# Patient Record
Sex: Male | Born: 1965 | Race: Black or African American | Hispanic: No | Marital: Single | Smoking: Current every day smoker
Health system: Southern US, Community
[De-identification: ages and names within clinical notes are randomized; demographics above are authoritative.]

## PROBLEM LIST (undated history)

## (undated) DIAGNOSIS — R45851 Suicidal ideations: Secondary | ICD-10-CM

## (undated) DIAGNOSIS — E119 Type 2 diabetes mellitus without complications: Secondary | ICD-10-CM

## (undated) DIAGNOSIS — I214 Non-ST elevation (NSTEMI) myocardial infarction: Secondary | ICD-10-CM

## (undated) DIAGNOSIS — F329 Major depressive disorder, single episode, unspecified: Secondary | ICD-10-CM

## (undated) DIAGNOSIS — I639 Cerebral infarction, unspecified: Secondary | ICD-10-CM

## (undated) DIAGNOSIS — I10 Essential (primary) hypertension: Secondary | ICD-10-CM

## (undated) DIAGNOSIS — F32A Depression, unspecified: Secondary | ICD-10-CM

---

## 2005-01-27 DIAGNOSIS — I214 Non-ST elevation (NSTEMI) myocardial infarction: Secondary | ICD-10-CM

## 2005-01-27 HISTORY — DX: Non-ST elevation (NSTEMI) myocardial infarction: I21.4

## 2015-01-28 DIAGNOSIS — I639 Cerebral infarction, unspecified: Secondary | ICD-10-CM

## 2015-01-28 HISTORY — DX: Cerebral infarction, unspecified: I63.9

## 2017-06-17 ENCOUNTER — Emergency Department (HOSPITAL_COMMUNITY): Payer: Self-pay

## 2017-06-17 ENCOUNTER — Inpatient Hospital Stay (HOSPITAL_COMMUNITY)
Admission: EM | Admit: 2017-06-17 | Discharge: 2017-07-03 | DRG: 234 | Disposition: A | Discharge status: Skilled Nursing Facility | Payer: Non-veteran care | Attending: Cardiothoracic Surgery | Admitting: Cardiothoracic Surgery

## 2017-06-17 ENCOUNTER — Encounter (HOSPITAL_COMMUNITY): Payer: Self-pay | Admitting: Emergency Medicine

## 2017-06-17 DIAGNOSIS — I200 Unstable angina: Secondary | ICD-10-CM | POA: Diagnosis not present

## 2017-06-17 DIAGNOSIS — I252 Old myocardial infarction: Secondary | ICD-10-CM

## 2017-06-17 DIAGNOSIS — F17210 Nicotine dependence, cigarettes, uncomplicated: Secondary | ICD-10-CM | POA: Diagnosis present

## 2017-06-17 DIAGNOSIS — I10 Essential (primary) hypertension: Secondary | ICD-10-CM | POA: Diagnosis not present

## 2017-06-17 DIAGNOSIS — Z951 Presence of aortocoronary bypass graft: Secondary | ICD-10-CM

## 2017-06-17 DIAGNOSIS — Z794 Long term (current) use of insulin: Secondary | ICD-10-CM

## 2017-06-17 DIAGNOSIS — E7800 Pure hypercholesterolemia, unspecified: Secondary | ICD-10-CM

## 2017-06-17 DIAGNOSIS — I2583 Coronary atherosclerosis due to lipid rich plaque: Secondary | ICD-10-CM | POA: Diagnosis not present

## 2017-06-17 DIAGNOSIS — Z8249 Family history of ischemic heart disease and other diseases of the circulatory system: Secondary | ICD-10-CM

## 2017-06-17 DIAGNOSIS — I25110 Atherosclerotic heart disease of native coronary artery with unstable angina pectoris: Principal | ICD-10-CM | POA: Diagnosis present

## 2017-06-17 DIAGNOSIS — R791 Abnormal coagulation profile: Secondary | ICD-10-CM | POA: Diagnosis not present

## 2017-06-17 DIAGNOSIS — Z79899 Other long term (current) drug therapy: Secondary | ICD-10-CM

## 2017-06-17 DIAGNOSIS — I493 Ventricular premature depolarization: Secondary | ICD-10-CM | POA: Diagnosis not present

## 2017-06-17 DIAGNOSIS — I2089 Other forms of angina pectoris: Secondary | ICD-10-CM | POA: Diagnosis present

## 2017-06-17 DIAGNOSIS — T82855A Stenosis of coronary artery stent, initial encounter: Secondary | ICD-10-CM | POA: Diagnosis present

## 2017-06-17 DIAGNOSIS — Z8673 Personal history of transient ischemic attack (TIA), and cerebral infarction without residual deficits: Secondary | ICD-10-CM

## 2017-06-17 DIAGNOSIS — R079 Chest pain, unspecified: Secondary | ICD-10-CM

## 2017-06-17 DIAGNOSIS — D696 Thrombocytopenia, unspecified: Secondary | ICD-10-CM | POA: Diagnosis not present

## 2017-06-17 DIAGNOSIS — E785 Hyperlipidemia, unspecified: Secondary | ICD-10-CM | POA: Diagnosis not present

## 2017-06-17 DIAGNOSIS — I2510 Atherosclerotic heart disease of native coronary artery without angina pectoris: Secondary | ICD-10-CM | POA: Diagnosis not present

## 2017-06-17 DIAGNOSIS — D62 Acute posthemorrhagic anemia: Secondary | ICD-10-CM | POA: Diagnosis not present

## 2017-06-17 DIAGNOSIS — I208 Other forms of angina pectoris: Secondary | ICD-10-CM | POA: Diagnosis present

## 2017-06-17 DIAGNOSIS — Z955 Presence of coronary angioplasty implant and graft: Secondary | ICD-10-CM

## 2017-06-17 DIAGNOSIS — E119 Type 2 diabetes mellitus without complications: Secondary | ICD-10-CM | POA: Diagnosis present

## 2017-06-17 DIAGNOSIS — Z7902 Long term (current) use of antithrombotics/antiplatelets: Secondary | ICD-10-CM

## 2017-06-17 DIAGNOSIS — E8770 Fluid overload, unspecified: Secondary | ICD-10-CM | POA: Diagnosis present

## 2017-06-17 DIAGNOSIS — Y831 Surgical operation with implant of artificial internal device as the cause of abnormal reaction of the patient, or of later complication, without mention of misadventure at the time of the procedure: Secondary | ICD-10-CM | POA: Diagnosis present

## 2017-06-17 DIAGNOSIS — Z833 Family history of diabetes mellitus: Secondary | ICD-10-CM

## 2017-06-17 DIAGNOSIS — I120 Hypertensive chronic kidney disease with stage 5 chronic kidney disease or end stage renal disease: Secondary | ICD-10-CM | POA: Diagnosis present

## 2017-06-17 HISTORY — DX: Type 2 diabetes mellitus without complications: E11.9

## 2017-06-17 HISTORY — DX: Essential (primary) hypertension: I10

## 2017-06-17 HISTORY — DX: Non-ST elevation (NSTEMI) myocardial infarction: I21.4

## 2017-06-17 HISTORY — DX: Cerebral infarction, unspecified: I63.9

## 2017-06-17 LAB — CBC
HCT: 44.1 % (ref 39.0–52.0)
HEMATOCRIT: 40.9 % (ref 39.0–52.0)
Hemoglobin: 15 g/dL (ref 13.0–17.0)
Hemoglobin: 13.9 g/dL (ref 13.0–17.0)
MCH: 30.2 pg (ref 26.0–34.0)
MCH: 30.1 pg (ref 26.0–34.0)
MCHC: 34 g/dL (ref 30.0–36.0)
MCHC: 34 g/dL (ref 30.0–36.0)
MCV: 88.7 fL (ref 78.0–100.0)
MCV: 88.5 fL (ref 78.0–100.0)
PLATELETS: 214 10*3/uL (ref 150–400)
Platelets: 220 10*3/uL (ref 150–400)
RBC: 4.97 MIL/uL (ref 4.22–5.81)
RBC: 4.62 MIL/uL (ref 4.22–5.81)
RDW: 14.9 % (ref 11.5–15.5)
RDW: 15 % (ref 11.5–15.5)
WBC: 6.4 10*3/uL (ref 4.0–10.5)
WBC: 6.1 10*3/uL (ref 4.0–10.5)

## 2017-06-17 LAB — HEMOGLOBIN A1C
HEMOGLOBIN A1C: 7.4 % — AB (ref 4.8–5.6)
Mean Plasma Glucose: 165.68 mg/dL

## 2017-06-17 LAB — GLUCOSE, CAPILLARY: Glucose-Capillary: 243 mg/dL — ABNORMAL HIGH (ref 65–99)

## 2017-06-17 LAB — BASIC METABOLIC PANEL
Anion gap: 12 (ref 5–15)
BUN: 12 mg/dL (ref 6–20)
CALCIUM: 9.3 mg/dL (ref 8.9–10.3)
CO2: 20 mmol/L — ABNORMAL LOW (ref 22–32)
Chloride: 104 mmol/L (ref 101–111)
Creatinine, Ser: 0.95 mg/dL (ref 0.61–1.24)
GFR calc Af Amer: >60 mL/min (ref >60)
GLUCOSE: 237 mg/dL — AB (ref 65–99)
Potassium: 4.5 mmol/L (ref 3.5–5.1)
Sodium: 136 mmol/L (ref 135–145)

## 2017-06-17 LAB — I-STAT TROPONIN, ED
TROPONIN I, POC: 0.05 ng/mL (ref 0.00–0.08)
Troponin i, poc: 0.04 ng/mL (ref 0.00–0.08)

## 2017-06-17 LAB — CREATININE, SERUM
Creatinine, Ser: 1.1 mg/dL (ref 0.61–1.24)
GFR calc non Af Amer: >60 mL/min (ref >60)

## 2017-06-17 LAB — PROTIME-INR
INR: 1.02
PROTHROMBIN TIME: 13.3 s (ref 11.4–15.2)

## 2017-06-17 LAB — CBG MONITORING, ED: Glucose-Capillary: 104 mg/dL — ABNORMAL HIGH (ref 65–99)

## 2017-06-17 LAB — TROPONIN I: Troponin I: 0.04 ng/mL (ref <0.03)

## 2017-06-17 MED ORDER — CLOPIDOGREL BISULFATE 75 MG PO TABS
75.0000 mg | ORAL_TABLET | Freq: Every day | ORAL | Status: DC
  Administered 2017-06-18: 75 mg via ORAL
  Filled 2017-06-17: qty 1

## 2017-06-17 MED ORDER — SODIUM CHLORIDE 0.9 % WEIGHT BASED INFUSION
1.0000 mL/kg/h | INTRAVENOUS | Status: DC
  Administered 2017-06-18: 1 mL/kg/h via INTRAVENOUS

## 2017-06-17 MED ORDER — SODIUM CHLORIDE 0.9 % IV SOLN: 250.0000 mL | INTRAVENOUS | Status: DC | PRN

## 2017-06-17 MED ORDER — HEPARIN SODIUM (PORCINE) 5000 UNIT/ML IJ SOLN
5000.0000 [IU] | Freq: Three times a day (TID) | INTRAMUSCULAR | Status: DC
  Administered 2017-06-17: 5000 [IU] via SUBCUTANEOUS
  Filled 2017-06-17: qty 1

## 2017-06-17 MED ORDER — SODIUM CHLORIDE 0.9% FLUSH
3.0000 mL | Freq: Two times a day (BID) | INTRAVENOUS | Status: DC
  Administered 2017-06-17 – 2017-06-18 (×2): 3 mL via INTRAVENOUS

## 2017-06-17 MED ORDER — ASPIRIN EC 81 MG PO TBEC
81.0000 mg | DELAYED_RELEASE_TABLET | Freq: Every day | ORAL | Status: DC
  Administered 2017-06-19 – 2017-06-22 (×4): 81 mg via ORAL
  Filled 2017-06-17 (×4): qty 1

## 2017-06-17 MED ORDER — METOPROLOL TARTRATE 12.5 MG HALF TABLET
12.5000 mg | ORAL_TABLET | Freq: Two times a day (BID) | ORAL | Status: DC
  Administered 2017-06-17: 12.5 mg via ORAL
  Filled 2017-06-17: qty 1

## 2017-06-17 MED ORDER — ATORVASTATIN CALCIUM 40 MG PO TABS
40.0000 mg | ORAL_TABLET | Freq: Every day | ORAL | Status: DC
  Administered 2017-06-17: 40 mg via ORAL
  Filled 2017-06-17: qty 1

## 2017-06-17 MED ORDER — SODIUM CHLORIDE 0.9% FLUSH: 3.0000 mL | INTRAVENOUS | Status: DC | PRN

## 2017-06-17 MED ORDER — ASPIRIN 81 MG PO CHEW
81.0000 mg | CHEWABLE_TABLET | ORAL | Status: AC
  Administered 2017-06-18: 81 mg via ORAL
  Filled 2017-06-17: qty 1

## 2017-06-17 MED ORDER — NITROGLYCERIN 0.4 MG SL SUBL: 0.4000 mg | SUBLINGUAL_TABLET | SUBLINGUAL | Status: DC | PRN

## 2017-06-17 MED ORDER — ACETAMINOPHEN 325 MG PO TABS: 650.0000 mg | ORAL_TABLET | ORAL | Status: DC | PRN

## 2017-06-17 MED ORDER — ASPIRIN 300 MG RE SUPP
300.0000 mg | RECTAL | Status: AC
  Filled 2017-06-17: qty 1

## 2017-06-17 MED ORDER — INSULIN ASPART 100 UNIT/ML ~~LOC~~ SOLN
0.0000 [IU] | Freq: Three times a day (TID) | SUBCUTANEOUS | Status: DC
  Administered 2017-06-18 (×2): 8 [IU] via SUBCUTANEOUS
  Administered 2017-06-19 (×2): 2 [IU] via SUBCUTANEOUS
  Administered 2017-06-19: 5 [IU] via SUBCUTANEOUS
  Administered 2017-06-20 (×2): 2 [IU] via SUBCUTANEOUS
  Administered 2017-06-20: 5 [IU] via SUBCUTANEOUS
  Administered 2017-06-20: 8 [IU] via SUBCUTANEOUS
  Administered 2017-06-21: 5 [IU] via SUBCUTANEOUS
  Administered 2017-06-21: 3 [IU] via SUBCUTANEOUS
  Administered 2017-06-22: 11 [IU] via SUBCUTANEOUS
  Administered 2017-06-22: 3 [IU] via SUBCUTANEOUS

## 2017-06-17 MED ORDER — SODIUM CHLORIDE 0.9 % WEIGHT BASED INFUSION
3.0000 mL/kg/h | INTRAVENOUS | Status: DC
  Administered 2017-06-18: 3 mL/kg/h via INTRAVENOUS

## 2017-06-17 MED ORDER — ONDANSETRON HCL 4 MG/2ML IJ SOLN: 4.0000 mg | Freq: Four times a day (QID) | INTRAMUSCULAR | Status: DC | PRN

## 2017-06-17 MED ORDER — ENALAPRIL MALEATE 10 MG PO TABS
10.0000 mg | ORAL_TABLET | Freq: Every day | ORAL | Status: DC
  Administered 2017-06-19 – 2017-06-22 (×4): 10 mg via ORAL
  Filled 2017-06-17 (×6): qty 1

## 2017-06-17 MED ORDER — ASPIRIN 81 MG PO CHEW
324.0000 mg | CHEWABLE_TABLET | ORAL | Status: AC
  Administered 2017-06-17: 324 mg via ORAL
  Filled 2017-06-17: qty 4

## 2017-06-17 MED ORDER — GLIPIZIDE 5 MG PO TABS
5.0000 mg | ORAL_TABLET | Freq: Every day | ORAL | Status: DC
  Administered 2017-06-19 – 2017-06-22 (×4): 5 mg via ORAL
  Filled 2017-06-17 (×6): qty 1

## 2017-06-17 MED ORDER — INSULIN GLARGINE 100 UNIT/ML ~~LOC~~ SOLN
30.0000 [IU] | Freq: Every day | SUBCUTANEOUS | Status: DC
  Administered 2017-06-17 – 2017-06-18 (×2): 30 [IU] via SUBCUTANEOUS
  Filled 2017-06-17 (×2): qty 0.3

## 2017-06-17 NOTE — Progress Notes (Signed)
  Pt admitted to the unit. Pt is stable, alert and oriented per baseline. Oriented to room, staff, and call bell. Educated to call for any assistance. Bed in lowest position, call bell within reach- will continue to monitor. 

## 2017-06-17 NOTE — Progress Notes (Signed)
CRITICAL VALUE ALERT  Critical value received:  0.04  Date of notification:  06/17/2017  Time of notification:  10:20pm  Critical value read back:Yes.    Nurse who received alert:  Haskel Schroeder  MD notified (1st page):  MD on call  Time of first page:  10:27  MD notified (2nd page):  Time of second page:  Responding MD:  Awaiting any further orders  Time MD responded:

## 2017-06-17 NOTE — ED Triage Notes (Signed)
Pt arrives via [facility] for c.o. Chest pain radiating to left arm intermittently for 3 weeks. Pt endorses occasional shortness of breath with this.

## 2017-06-17 NOTE — H&P (Addendum)
Cardiology Admission History and Physical:   Patient ID: Marcus Cummings.; MRN: [id number]; DOB: 10/24/1965   Admission date: 06/17/2017  Primary Care Provider: [facility] Primary Cardiologist: Nanetta Batty, MD  - [facility] Primary Electrophysiologist:    Chief Complaint:  Chest pain  Patient Profile:   Marcus Cummings Sr. is a 52 y.o. male with a history of HTN, DM, MI with 2 stents placed in 2007 [facility] [place]), hx of stroke in 2017 on plavix, and mood disorder. He is a current smoker.  History of Present Illness:   Marcus Cummings presented to [facility] via EMS with complaints of intermittent chest pain over the past two weeks. He states that with any exertion, such as walking to the bus stop, he feels a tightness in his central chest associated with SOB and diaphoresis. This chest tightness is relieved when he rests. He has experienced these episodes of exertional chest tightness about every other day for the past 2 weeks. Last evening at approximately 5 pm, he started having the chest tightness again while walking but this time he had a tingling sensation that radiated down his left arm. He states that this reminded him of his prior MI in 2001. He went to the [facility] this morning for treatment and was sent to [facility] for further evaluation.   On arrival, he is chest pain free at rest. Initial troponin was negative. EKG with poor R wave progression, can't rule out anterior infarct. He seems complaints on his medications that he received from the [place]  He has a history of 2 prior stents in 2007 placed at [facility] (records pending). Following his stents, he took plavix. 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 He was subsequently taken off his plavix and had a stroke in 2017 and he was forced to stop driving a truck. He became homeless for a time and is now living in a group home for veterans. He receives 3 meals per day at this home, but it sounds like the diet  is not heart healthy. He is working on getting a Community education officer for an apartment. He spent 30 days at the [facility] psychiatric unit for mood stability issues and has been compliant on all meds since. He smokes 1/2 ppd for the past 20 years. He has a significant family history of heart disease. His mother had a MI and died in her 16s (she was ESRD on HD) and his father had congestive heart failure.   Past Medical History:  Diagnosis Date  . Diabetes mellitus without complication (HCC)   . Hypertension   . MI, acute, non ST segment elevation (HCC) 2007   stents x 2 2007 [facility] [place])  . Stroke [facility]) 2017    History reviewed. No pertinent surgical history.   Medications Prior to Admission: Prior to Admission medications   Medication Sig Start Date End Date Taking? Authorizing Provider  atorvastatin (LIPITOR) 40 MG tablet Take 40 mg by mouth daily at 6 PM.   Yes [provider]  clopidogrel (PLAVIX) 75 MG tablet Take 75 mg by mouth daily.   Yes [provider]  enalapril (VASOTEC) 10 MG tablet Take 10 mg by mouth daily.   Yes [provider]  glipiZIDE (GLUCOTROL) 5 MG tablet Take 5 mg by mouth daily before breakfast.   Yes [provider]  insulin glargine (LANTUS) 100 UNIT/ML injection Inject 30 Units into the skin at bedtime.  Yes [provider]  Melatonin 3 MG CAPS Take 6 mg by mouth at bedtime as needed (for sleep).   Yes [provider]  metFORMIN (GLUCOPHAGE) 1000 MG tablet Take 1,000 mg by mouth 2 (two) times daily with a meal.   Yes [provider]  Naltrexone 380 MG SUSR Inject 380 mg into the muscle every 28 (twenty-eight) days.   Yes [provider]  naproxen sodium (ALEVE) 220 MG tablet Take 440 mg by mouth as needed (arthritis).   Yes [provider]  paliperidone (INVEGA SUSTENNA) 156 MG/ML SUSY injection Inject 156 mg into the muscle every 28 (twenty-eight) days.   Yes [provider]      Allergies:   No Known Allergies  Social History:   Social History   Socioeconomic History  . Marital status: Single    Spouse name: Not on file  . Number of children: Not on file  . Years of education: Not on file  . Highest education level: Not on file  Occupational History  . Not on file  Social Needs  . Financial resource strain: Not on file  . Food insecurity:    Worry: Not on file    Inability: Not on file  . Transportation needs:    Medical: Not on file    Non-medical: Not on file  Tobacco Use  . Smoking status: Not on file  Substance and Sexual Activity  . Alcohol use: Not on file  . Drug use: Not on file  . Sexual activity: Not on file  Lifestyle  . Physical activity:    Days per week: Not on file    Minutes per session: Not on file  . Stress: Not on file  Relationships  . Social connections:    Talks on phone: Not on file    Gets together: Not on file    Attends religious service: Not on file    Active member of club or organization: Not on file    Attends meetings of clubs or organizations: Not on file    Relationship status: Not on file  . Intimate partner violence:    Fear of current or ex partner: Not on file    Emotionally abused: Not on file    Physically abused: Not on file    Forced sexual activity: Not on file  Other Topics Concern  . Not on file  Social History Narrative  . Not on file    Family History:   The patient's family history includes CAD in his mother; Congestive Heart Failure in his father.    ROS:  Please see the history of present illness.  All other ROS reviewed and negative.     Physical Exam/Data:   Vitals:   06/17/17 1409 06/17/17 1415 06/17/17 1430 06/17/17 1515  BP: (!) 139/93 (!) 136/97 (!) 147/98 (!) 158/99  Pulse: 62 65 63 64  Resp: Temp:      SpO2: 99% 98% 97% 98%  Weight:      Height:       No intake or output data in the 24 hours ending 06/17/17 1532 Filed Weights   06/17/17 1025   Weight: 210 lb (95.3 kg)   Body mass index is 28.48 kg/m.  General:  Well nourished, well developed, in no acute distress HEENT: normal Neck: no JVD Vascular: No carotid bruits Cardiac:  normal S1, S2; RRR; no murmur Lungs:  clear to auscultation bilaterally, no wheezing, rhonchi or rales  Abd: soft, nontender, no hepatomegaly  Ext: no edema Musculoskeletal:  No deformities, BUE and BLE strength normal and equal Skin: warm and dry  Neuro:  CNs 2-12 intact, no focal abnormalities noted Psych:  Normal affect    EKG:  The ECG that was done was personally reviewed and demonstrates sinus with poor R wave progression  Relevant CV Studies:  None - records pending  Laboratory Data:  Chemistry Recent Labs  Lab 06/17/17 1030  NA 136  K 4.5  CL 104  CO2 20*  GLUCOSE 237*  BUN 12  CREATININE 0.95  CALCIUM 9.3  GFRNONAA >60  GFRAA >60  ANIONGAP 12    No results for input(s): PROT, ALBUMIN, AST, ALT, ALKPHOS, BILITOT in the last 168 hours. Hematology Recent Labs  Lab 06/17/17 1030  WBC 6.4  RBC 4.97  HGB 15.0  HCT 44.1  MCV 88.7  MCH 30.2  MCHC 34.0  RDW 14.9  PLT 214   Cardiac EnzymesNo results for input(s): TROPONINI in the last 168 hours.  Recent Labs  Lab 06/17/17 1053  TROPIPOC 0.05    BNPNo results for input(s): BNP, PROBNP in the last 168 hours.  DDimer No results for input(s): DDIMER in the last 168 hours.  Radiology/Studies:  Dg Chest 2 View  Result Date: 06/17/2017 CLINICAL DATA:  Chest pain EXAM: CHEST - 2 VIEW COMPARISON:  None. FINDINGS: Lungs are clear. Heart size and pulmonary vascularity are normal. No adenopathy. No pneumothorax. No bone lesions. IMPRESSION: No edema or consolidation. Electronically Signed   By: Bretta Bang III M.D.   On: 06/17/2017 10:43    Assessment and Plan:   1. Chest pain, history of CAD, MI s/p stents x 2 in 2007 - initial troponin negative - EKG with poor R wave progression - Pt describes symptoms  concerning for stable angina - given his known disease, and risk factors for ACS including HTN, DM, current smoker, and family history, will admit to cardiology. Plan for heart cath tomorrow. Echo ordered.   2. DM - A1c pending - SSI ordered with admission orders  3. HTN - enalapril 10 mg at home - will continue this  4. HLD - will collect a lipid panel - continue home lipitor 40 mg  5. Current smoker - encouraged cessation    Severity of Illness: The appropriate patient status for this patient is INPATIENT. Inpatient status is judged to be reasonable and necessary in order to provide the required intensity of service to ensure the patient's safety. The patient's presenting symptoms, physical exam findings, and initial radiographic and laboratory data in the context of their chronic comorbidities is felt to place them at high risk for further clinical deterioration. Furthermore, it is not anticipated that the patient will be medically stable for discharge from the hospital within 2 midnights of admission. The following factors support the patient status of inpatient.   " The patient's presenting symptoms include stable angina. " The worrisome physical exam findings include . " The initial radiographic and laboratory data are worrisome because of poor R wave progression. " The chronic co-morbidities include DM, HTN, HLD, smoking, family history.   * I certify that at the point of admission it is my clinical judgment that the patient will require inpatient hospital care spanning beyond 2 midnights from the point of admission due to high intensity of service, high risk for further deterioration and high frequency of surveillance required.*    For questions or updates, please contact [facility] Please  consult [email or web address] for contact info under Cardiology/STEMI.    Signed, Marcelino Duster, [place]  06/17/2017 3:32 PM   Agree with note by Micah Flesher PA-C  52 year old  African-American male with a history of remote MI and stenting.  He had a stroke last year.  He now lives in a veterans home and is unemployed.  He has risk factors including family history, treated diabetes, hypertension, hyperlipidemia and ongoing tobacco abuse.  He has had new onset effort angina which is pretty typical.  Its similar to his prior symptoms.  His EKG shows no acute changes.  Enzymes are negative.  His exam is benign.  We will admit him and schedule him to undergo coronary angiography tomorrow.The patient understands that risks included but are not limited to stroke (1 in 1000), death (1 in 1000), kidney failure [usually temporary] (1 in 500), bleeding (1 in 200), allergic reaction [possibly serious] (1 in 200). The patient understands and agrees to proceed   Runell Gess, M.D., FACP, [facility], Kathryne Eriksson [facility] [address] [place], [place]  [address]  [phone] 06/17/2017 4:39 PM

## 2017-06-17 NOTE — Progress Notes (Signed)
Pt is scheduled for left heart cath tomorrow, first case with Dr. Tresa Endo.

## 2017-06-17 NOTE — ED Provider Notes (Signed)
[facility] EMERGENCY DEPARTMENT Provider Note   CSN: [id number] Arrival date & time: 06/17/17  1023     History   Chief Complaint Chief Complaint  Patient presents with  . Chest Pain    HPI Marcus JUDSON Sr. is a 52 y.o. male with past medical history of hypertension, MI, diabetes, CVA, presenting to the ED with complaint of chest tightness and SOB with exertion has been ongoing for multiple weeks.  He states when he begins walking around or exerting himself he begins feeling tightness in his chest and shortness of breath, he then sits down and states the symptoms resolve.  Reports yesterday he had a similar episode though during the episode he felt his left arm feel tingly.  Denies any chest pain or shortness of breath with rest, and is asymptomatic in the ED.  Reports history of MI in 2008, which is the time he reports was the last time he had a cardiac cath or any other cardiac workup.  Also reports CVA in 2017.  States he is taking Plavix daily and is compliant with his medications.  His medications are managed by the [place] in [place].  He does not have a cardiologist.  The history is provided by the patient.    Past Medical History:  Diagnosis Date  . Diabetes mellitus without complication (HCC)   . Hypertension   . MI, acute, non ST segment elevation (HCC) 2007   stents x 2 2007 [facility] [place])  . Stroke [facility]) 2017    There are no active problems to display for this patient.   History reviewed. No pertinent surgical history.      Home Medications    Prior to Admission medications   Medication Sig Start Date End Date Taking? Authorizing Provider  atorvastatin (LIPITOR) 40 MG tablet Take 40 mg by mouth daily at 6 PM.   Yes [provider]  clopidogrel (PLAVIX) 75 MG tablet Take 75 mg by mouth daily.   Yes [provider]  enalapril (VASOTEC) 10 MG tablet Take 10 mg by mouth daily.   Yes [provider]  glipiZIDE  (GLUCOTROL) 5 MG tablet Take 5 mg by mouth daily before breakfast.   Yes [provider]  insulin glargine (LANTUS) 100 UNIT/ML injection Inject 30 Units into the skin at bedtime.   Yes [provider]  Melatonin 3 MG CAPS Take 6 mg by mouth at bedtime as needed (for sleep).   Yes [provider]  metFORMIN (GLUCOPHAGE) 1000 MG tablet Take 1,000 mg by mouth 2 (two) times daily with a meal.   Yes [provider]  Naltrexone 380 MG SUSR Inject 380 mg into the muscle every 28 (twenty-eight) days.   Yes [provider]  naproxen sodium (ALEVE) 220 MG tablet Take 440 mg by mouth as needed (arthritis).   Yes [provider]  paliperidone (INVEGA SUSTENNA) 156 MG/ML SUSY injection Inject 156 mg into the muscle every 28 (twenty-eight) days.   Yes [provider]    Family History Family History  Problem Relation Age of Onset  . CAD Mother   . Congestive Heart Failure Father     Social History Social History   Tobacco Use  . Smoking status: Not on file  Substance Use Topics  . Alcohol use: Not on file  . Drug use: Not on file     Allergies   Patient has no known allergies.   Review of Systems Review of Systems  Constitutional: Negative for diaphoresis.  Respiratory: Positive for shortness of breath.   Cardiovascular: Positive for chest pain. Negative for palpitations and leg swelling.  Gastrointestinal: Negative for nausea.  All other systems reviewed and are negative.    Physical Exam Updated Vital Signs BP (!) 158/99   Pulse 64   Temp 97.6 F (36.4 C)   Resp 16   Ht 6' (1.829 m)   Wt 95.3 kg (210 lb)   SpO2 98%   BMI 28.48 kg/m   Physical Exam  Constitutional: He appears well-developed and well-nourished. He does not appear ill. No distress.  HENT:  Head: Normocephalic and atraumatic.  Mouth/Throat: Oropharynx is clear and moist.  Eyes: Conjunctivae are normal.  Neck: Normal range of motion. Neck  supple. No JVD present. No tracheal deviation present.  Cardiovascular: Normal rate, regular rhythm, normal heart sounds and intact distal pulses.  Pulmonary/Chest: Effort normal and breath sounds normal. No respiratory distress.  Abdominal: Soft. Bowel sounds are normal. He exhibits no distension. There is no tenderness. There is no guarding.  Musculoskeletal:  No Lower extremity edema or tenderness  Neurological: He is alert.  Skin: Skin is warm.  Psychiatric: He has a normal mood and affect. His behavior is normal.  Nursing note and vitals reviewed.    ED Treatments / Results  Labs (all labs ordered are listed, but only abnormal results are displayed) Labs Reviewed  BASIC METABOLIC PANEL - Abnormal; Notable for the following components:      Result Value   CO2 20 (*)    Glucose, Bld 237 (*)    All other components within normal limits  CBC  I-STAT TROPONIN, ED  I-STAT TROPONIN, ED    EKG EKG Interpretation  Date/Time:  Wednesday Jun 17 2017 10:24:41 EDT Ventricular Rate:  71 PR Interval:  150 QRS Duration: 80 QT Interval:  388 QTC Calculation: 421 R Axis:   6 Text Interpretation:  Normal sinus rhythm Cannot rule out Anterior infarct , age undetermined Abnormal ECG No previous ECGs available Confirmed by Richardean Canal ([id number]) on 06/17/2017 2:06:43 PM   Radiology Dg Chest 2 View  Result Date: 06/17/2017 CLINICAL DATA:  Chest pain EXAM: CHEST - 2 VIEW COMPARISON:  None. FINDINGS: Lungs are clear. Heart size and pulmonary vascularity are normal. No adenopathy. No pneumothorax. No bone lesions. IMPRESSION: No edema or consolidation. Electronically Signed   By: Bretta Bang III M.D.   On: 06/17/2017 10:43    Procedures Procedures (including critical care time)  Medications Ordered in ED Medications - No data to display   Initial Impression / Assessment and Plan / ED Course  I have reviewed the triage vital signs and the nursing notes.  Pertinent labs & imaging  results that were available during my care of the patient were reviewed by me and considered in my medical decision making (see chart for details).  Clinical Course as of Jun 18 1530  Wed Jun 17, 2017  1441 Cards to evaluate patient   [JR]    Clinical Course User Index [JR] Florice Hindle, [place] N, PA-C    Patient presenting to the ED with symptoms concerning for stable angina.  Chest pain and shortness of breath with exertion, improved at rest.  Patient with multiple risk factors.  History of MI in 2008, no recent cardiac evaluation.  Cardiology consulted and evaluated the patient in the ED.  Will admit for likely cath tomorrow.  CBC and BMP unremarkable.  No previous EKG for comparison, though no  ST elevations. First round of cardiac enzymes negative. This case was discussed with Dr. Silverio Lay who agrees with plan to admit.   The patient appears reasonably stabilized for admission considering the current resources, flow, and capabilities available in the ED at this time, and I doubt any other [facility] requiring further screening and/or treatment in the ED prior to admission.  Final Clinical Impressions(s) / ED Diagnoses   Final diagnoses:  Chest pain on exertion    ED Discharge Orders    None       Toretto Tingler, [place] N, PA-C 06/17/17 1532    Charlynne Pander, MD 06/17/17 2149

## 2017-06-18 ENCOUNTER — Other Ambulatory Visit: Payer: Self-pay

## 2017-06-18 ENCOUNTER — Encounter (HOSPITAL_COMMUNITY): Payer: Self-pay | Admitting: General Practice

## 2017-06-18 ENCOUNTER — Inpatient Hospital Stay (HOSPITAL_COMMUNITY): Payer: Self-pay

## 2017-06-18 ENCOUNTER — Other Ambulatory Visit: Payer: Self-pay | Admitting: *Deleted

## 2017-06-18 ENCOUNTER — Ambulatory Visit (HOSPITAL_COMMUNITY)
Admission: EM | Disposition: A | Discharge status: Skilled Nursing Facility | Payer: Self-pay | Source: Home / Self Care | Attending: Cardiothoracic Surgery

## 2017-06-18 DIAGNOSIS — I25110 Atherosclerotic heart disease of native coronary artery with unstable angina pectoris: Secondary | ICD-10-CM | POA: Diagnosis not present

## 2017-06-18 DIAGNOSIS — I2510 Atherosclerotic heart disease of native coronary artery without angina pectoris: Secondary | ICD-10-CM | POA: Diagnosis not present

## 2017-06-18 DIAGNOSIS — E119 Type 2 diabetes mellitus without complications: Secondary | ICD-10-CM

## 2017-06-18 DIAGNOSIS — R079 Chest pain, unspecified: Secondary | ICD-10-CM

## 2017-06-18 HISTORY — PX: LEFT HEART CATH AND CORONARY ANGIOGRAPHY: CATH118249

## 2017-06-18 LAB — HIV ANTIBODY (ROUTINE TESTING W REFLEX): HIV SCREEN 4TH GENERATION: NONREACTIVE

## 2017-06-18 LAB — URINALYSIS, ROUTINE W REFLEX MICROSCOPIC
Bilirubin Urine: NEGATIVE
Glucose, UA: >=500 mg/dL — AB
Hgb urine dipstick: NEGATIVE
Ketones, ur: NEGATIVE mg/dL
Leukocytes, UA: NEGATIVE
Nitrite: NEGATIVE
Protein, ur: NEGATIVE mg/dL
Specific Gravity, Urine: 1.02 (ref 1.005–1.030)
pH: 6 (ref 5.0–8.0)

## 2017-06-18 LAB — BASIC METABOLIC PANEL
Anion gap: 9 (ref 5–15)
BUN: 13 mg/dL (ref 6–20)
CALCIUM: 8.9 mg/dL (ref 8.9–10.3)
CO2: 23 mmol/L (ref 22–32)
CREATININE: 0.97 mg/dL (ref 0.61–1.24)
Chloride: 106 mmol/L (ref 101–111)
Glucose, Bld: 142 mg/dL — ABNORMAL HIGH (ref 65–99)
Potassium: 4.3 mmol/L (ref 3.5–5.1)
SODIUM: 138 mmol/L (ref 135–145)

## 2017-06-18 LAB — PULMONARY FUNCTION TEST
FEF 25-75 Pre: 2.88 L/sec
FEF2575-%Pred-Pre: 81 %
FEV1-%Pred-Pre: 90 %
FEV1-Pre: 3.23 L
FEV1FVC-%Pred-Pre: 96 %
FEV6-%Pred-Pre: 94 %
FEV6-Pre: 4.14 L
FEV6FVC-%Pred-Pre: 102 %
FVC-%Pred-Pre: 92 %
FVC-Pre: 4.18 L
Pre FEV1/FVC ratio: 77 %
Pre FEV6/FVC Ratio: 99 %

## 2017-06-18 LAB — CBC
HEMATOCRIT: 40.9 % (ref 39.0–52.0)
HEMOGLOBIN: 13.9 g/dL (ref 13.0–17.0)
MCH: 30.4 pg (ref 26.0–34.0)
MCHC: 34 g/dL (ref 30.0–36.0)
MCV: 89.5 fL (ref 78.0–100.0)
Platelets: 204 10*3/uL (ref 150–400)
RBC: 4.57 MIL/uL (ref 4.22–5.81)
RDW: 14.8 % (ref 11.5–15.5)
WBC: 5 10*3/uL (ref 4.0–10.5)

## 2017-06-18 LAB — LIPID PANEL
CHOLESTEROL: 224 mg/dL — AB (ref 0–200)
HDL: 33 mg/dL — ABNORMAL LOW (ref >40)
LDL Cholesterol: 161 mg/dL — ABNORMAL HIGH (ref 0–99)
TRIGLYCERIDES: 150 mg/dL — AB (ref <150)
Total CHOL/HDL Ratio: 6.8 RATIO
VLDL: 30 mg/dL (ref 0–40)

## 2017-06-18 LAB — GLUCOSE, CAPILLARY
GLUCOSE-CAPILLARY: 260 mg/dL — AB (ref 65–99)
GLUCOSE-CAPILLARY: 138 mg/dL — AB (ref 65–99)
Glucose-Capillary: 145 mg/dL — ABNORMAL HIGH (ref 65–99)
Glucose-Capillary: 295 mg/dL — ABNORMAL HIGH (ref 65–99)

## 2017-06-18 LAB — TROPONIN I
TROPONIN I: 0.03 ng/mL — AB (ref <0.03)
Troponin I: 0.05 ng/mL (ref <0.03)

## 2017-06-18 LAB — SURGICAL PCR SCREEN
MRSA, PCR: NEGATIVE
Staphylococcus aureus: POSITIVE — AB

## 2017-06-18 LAB — MRSA PCR SCREENING: MRSA BY PCR: NEGATIVE

## 2017-06-18 SURGERY — LEFT HEART CATH AND CORONARY ANGIOGRAPHY
Anesthesia: LOCAL

## 2017-06-18 MED ORDER — MIDAZOLAM HCL 2 MG/2ML IJ SOLN
INTRAMUSCULAR | Status: AC
Start: 2017-06-18 — End: ?
  Filled 2017-06-18: qty 2

## 2017-06-18 MED ORDER — FENTANYL CITRATE (PF) 100 MCG/2ML IJ SOLN
INTRAMUSCULAR | Status: DC | PRN
  Administered 2017-06-18: 50 ug via INTRAVENOUS

## 2017-06-18 MED ORDER — LIDOCAINE HCL (PF) 1 % IJ SOLN
INTRAMUSCULAR | Status: AC
  Filled 2017-06-18: qty 30

## 2017-06-18 MED ORDER — METOPROLOL TARTRATE 25 MG PO TABS
25.0000 mg | ORAL_TABLET | Freq: Two times a day (BID) | ORAL | Status: DC
  Administered 2017-06-18 – 2017-06-22 (×10): 25 mg via ORAL
  Filled 2017-06-18 (×10): qty 1

## 2017-06-18 MED ORDER — IOHEXOL 350 MG/ML SOLN
INTRAVENOUS | Status: DC | PRN
  Administered 2017-06-18: 75 mL via INTRA_ARTERIAL

## 2017-06-18 MED ORDER — HEPARIN (PORCINE) IN NACL 2-0.9 UNITS/ML
INTRAMUSCULAR | Status: AC | PRN
  Administered 2017-06-18 (×2): 500 mL

## 2017-06-18 MED ORDER — MUPIROCIN 2 % EX OINT
1.0000 "application " | TOPICAL_OINTMENT | Freq: Two times a day (BID) | CUTANEOUS | Status: DC
  Administered 2017-06-19 – 2017-06-22 (×8): 1 via NASAL
  Filled 2017-06-18 (×2): qty 22

## 2017-06-18 MED ORDER — CHLORHEXIDINE GLUCONATE CLOTH 2 % EX PADS
6.0000 | MEDICATED_PAD | Freq: Every day | CUTANEOUS | Status: DC
  Administered 2017-06-19 – 2017-06-21 (×3): 6 via TOPICAL

## 2017-06-18 MED ORDER — HEPARIN (PORCINE) IN NACL 1000-0.9 UT/500ML-% IV SOLN
INTRAVENOUS | Status: AC
  Filled 2017-06-18: qty 1000

## 2017-06-18 MED ORDER — DIAZEPAM 5 MG PO TABS: 5.0000 mg | ORAL_TABLET | Freq: Four times a day (QID) | ORAL | Status: DC | PRN

## 2017-06-18 MED ORDER — MIDAZOLAM HCL 2 MG/2ML IJ SOLN
INTRAMUSCULAR | Status: DC | PRN
  Administered 2017-06-18: 2 mg via INTRAVENOUS

## 2017-06-18 MED ORDER — VERAPAMIL HCL 2.5 MG/ML IV SOLN
INTRAVENOUS | Status: AC
  Filled 2017-06-18: qty 2

## 2017-06-18 MED ORDER — VERAPAMIL HCL 2.5 MG/ML IV SOLN
INTRAVENOUS | Status: DC | PRN
  Administered 2017-06-18: 10 mL via INTRA_ARTERIAL

## 2017-06-18 MED ORDER — ATORVASTATIN CALCIUM 80 MG PO TABS
80.0000 mg | ORAL_TABLET | Freq: Every day | ORAL | Status: DC
Start: 2017-06-18 — End: 2017-06-23
  Administered 2017-06-18 – 2017-06-22 (×5): 80 mg via ORAL
  Filled 2017-06-18 (×5): qty 1

## 2017-06-18 MED ORDER — ONDANSETRON HCL 4 MG/2ML IJ SOLN: 4.0000 mg | Freq: Four times a day (QID) | INTRAMUSCULAR | Status: DC | PRN

## 2017-06-18 MED ORDER — HEPARIN (PORCINE) IN NACL 100-0.45 UNIT/ML-% IJ SOLN
1100.0000 [IU]/h | INTRAMUSCULAR | Status: DC
  Administered 2017-06-18: 1100 [IU]/h via INTRAVENOUS
  Administered 2017-06-19: 1200 [IU]/h via INTRAVENOUS
  Administered 2017-06-20 – 2017-06-22 (×3): 1100 [IU]/h via INTRAVENOUS
  Filled 2017-06-18 (×5): qty 250

## 2017-06-18 MED ORDER — HEPARIN SODIUM (PORCINE) 1000 UNIT/ML IJ SOLN
INTRAMUSCULAR | Status: DC | PRN
  Administered 2017-06-18: 4500 [IU] via INTRAVENOUS

## 2017-06-18 MED ORDER — FENTANYL CITRATE (PF) 100 MCG/2ML IJ SOLN
INTRAMUSCULAR | Status: AC
  Filled 2017-06-18: qty 2

## 2017-06-18 MED ORDER — ACETAMINOPHEN 325 MG PO TABS: 650.0000 mg | ORAL_TABLET | ORAL | Status: DC | PRN

## 2017-06-18 MED ORDER — SODIUM CHLORIDE 0.9% FLUSH: 3.0000 mL | INTRAVENOUS | Status: DC | PRN

## 2017-06-18 MED ORDER — SODIUM CHLORIDE 0.9 % IV SOLN
INTRAVENOUS | Status: DC
Start: 2017-06-18 — End: 2017-06-23
  Administered 2017-06-18: 1000 mL via INTRAVENOUS

## 2017-06-18 MED ORDER — LIDOCAINE HCL (PF) 1 % IJ SOLN
INTRAMUSCULAR | Status: DC | PRN
  Administered 2017-06-18: 2 mL via SUBCUTANEOUS

## 2017-06-18 MED ORDER — SODIUM CHLORIDE 0.9 % IV SOLN: 250.0000 mL | INTRAVENOUS | Status: DC | PRN

## 2017-06-18 MED ORDER — ASPIRIN 81 MG PO CHEW: 81.0000 mg | CHEWABLE_TABLET | Freq: Every day | ORAL | Status: DC

## 2017-06-18 MED ORDER — SODIUM CHLORIDE 0.9 % IV SOLN
INTRAVENOUS | Status: DC
  Administered 2017-06-18: 15:00:00 via INTRAVENOUS

## 2017-06-18 MED ORDER — SODIUM CHLORIDE 0.9% FLUSH
3.0000 mL | Freq: Two times a day (BID) | INTRAVENOUS | Status: DC
Start: 2017-06-18 — End: 2017-06-23
  Administered 2017-06-18 – 2017-06-22 (×8): 3 mL via INTRAVENOUS

## 2017-06-18 MED ORDER — ISOSORBIDE MONONITRATE ER 30 MG PO TB24
30.0000 mg | ORAL_TABLET | Freq: Every day | ORAL | Status: DC
  Administered 2017-06-18 – 2017-06-22 (×5): 30 mg via ORAL
  Filled 2017-06-18 (×5): qty 1

## 2017-06-18 SURGICAL SUPPLY — 12 items
CATH INFINITI 5FR ANG PIGTAIL (CATHETERS) ×2 IMPLANT
CATH OPTITORQUE TIG 4.0 5F (CATHETERS) ×2 IMPLANT
DEVICE RAD COMP TR BAND LRG (VASCULAR PRODUCTS) ×2 IMPLANT
GUIDEWIRE INQWIRE 1.5J.035X260 (WIRE) ×1 IMPLANT
INQWIRE 1.5J .035X260CM (WIRE) ×2
KIT HEART LEFT (KITS) ×2 IMPLANT
NEEDLE PERC 21GX4CM (NEEDLE) ×2 IMPLANT
PACK CARDIAC CATHETERIZATION (CUSTOM PROCEDURE TRAY) ×2 IMPLANT
SHEATH RAIN RADIAL 21G 6FR (SHEATH) ×2 IMPLANT
SYR MEDRAD MARK V 150ML (SYRINGE) ×2 IMPLANT
TRANSDUCER W/STOPCOCK (MISCELLANEOUS) ×2 IMPLANT
TUBING CIL FLEX 10 FLL-RA (TUBING) ×2 IMPLANT

## 2017-06-18 NOTE — Consult Note (Signed)
[address]       [place] [address]             [phone]        Marcus Bathe Sr. [place] Medical Record #[id number] Date of Birth: Mar 22, 1965   Referring: Nicki Guadalajara MD Primary Care: [facility] Primary Cardiologist:Jonathan Allyson Sabal, MD  Chief Complaint:    Chief Complaint  Patient presents with  . Chest Pain   Patient examined, coronary angiogram images personally reviewed and counseled with patient  History of Present Illness:     52 year old diabetic smoker AA admitted with symptoms of unstable angina.  Patient had PCI at the [facility] 2008.  He has had exertional angina for the past 2 weeks of increasing frequency and intensity. He was seen at a [place] clinic locally and sent to the [facility] ED.  EKG showed nonspecific changes and troponin was negative.  Patient was admitted and underwent cardiac catheterization which shows EF 55%, new left main stenosis with moderate RCA stenosis.  LVEDP 10 mmHg.  Patient is currently pain-free on return. Patient has been on chronic Plavix following a stroke last year.  Etiology of stroke is unclear-no specific history of A. fib or carotid disease. Patient currently lives in a group Home for veterans. Patient has had previous psych hospitalization for depression     He has a history of 2 prior stents in 2007 placed at [facility] (records pending). Following his stents, he took plavix. 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 He was subsequently taken off his plavix and had a stroke in 2017 and he was forced to stop driving a truck. He became homeless for a time and is now living in a group home for veterans. He receives 3 meals per day at this home, but it sounds like the diet is not heart healthy. He is working on getting a Community education officer for an apartment. He spent 30 days at the [facility] psychiatric unit for mood stability issues and has been compliant on all meds since. He smokes 1/2 ppd for the  past 20 years. He has a significant family history of heart disease. His mother had a MI and died in her 31s (she was ESRD on HD) and his father had congestive heart failure.       Current Activity/ Functional Status: Sedentary lifestyle   Zubrod Score: At the time of surgery this patient's most appropriate activity status/level should be described as:     0    Normal activity, no symptoms     1    Restricted in physical strenuous activity but ambulatory, able to do out light work     2    Ambulatory and capable of self care, unable to do work activities, up and about                 more than 50%  Of the time                                3    Only limited self care, in bed greater than 50% of waking hours     4    Completely disabled, no self care, confined to bed or chair     5    Moribund  Past Medical History:  Diagnosis Date  . Diabetes mellitus without complication (  HCC)   . Hypertension   . MI, acute, non ST segment elevation (HCC) 2007   stents x 2 2007 [facility] [place])  . Stroke [facility]) 2017    Past Surgical History:  Procedure Laterality Date  . LEFT HEART CATH AND CORONARY ANGIOGRAPHY N/A 06/18/2017   Procedure: LEFT HEART CATH AND CORONARY ANGIOGRAPHY;  Surgeon: Lennette Bihari, MD;  Location: [facility] INVASIVE CV LAB;  Service: Cardiovascular;  Laterality: N/A;    Social History   Tobacco Use  Smoking Status Not on file    Social History   Substance and Sexual Activity  Alcohol Use Not on file     No Known Allergies  Current Facility-Administered Medications  Medication Dose Route Frequency Provider Last Rate Last Dose  . 0.9 %  sodium chloride infusion   Intravenous Continuous Lennette Bihari, MD   Stopped at 06/18/17 1500  . 0.9 %  sodium chloride infusion  250 mL Intravenous PRN Lennette Bihari, MD      . 0.9 %  sodium chloride infusion   Intravenous Continuous Scarlett Presto, RPH 100 mL/hr at 06/18/17 1516    . acetaminophen (TYLENOL) tablet 650  mg  650 mg Oral Q4H PRN Lennette Bihari, MD      . aspirin EC tablet 81 mg  81 mg Oral Daily Lennette Bihari, MD      . atorvastatin (LIPITOR) tablet 80 mg  80 mg Oral q1800 Lennette Bihari, MD      . diazepam (VALIUM) tablet 5 mg  5 mg Oral Q6H PRN Lennette Bihari, MD      . enalapril (VASOTEC) tablet 10 mg  10 mg Oral Daily Lennette Bihari, MD      . glipiZIDE (GLUCOTROL) tablet 5 mg  5 mg Oral QAC breakfast Nicki Guadalajara A, MD      . heparin ADULT infusion 100 units/mL (25000 units/242mL sodium chloride 0.45%)  1,100 Units/hr Intravenous Continuous Runell Gess, MD 11 mL/hr at 06/18/17 1631 1,100 Units/hr at 06/18/17 1631  . insulin aspart (novoLOG) injection 0-15 Units  0-15 Units Subcutaneous TID WC Lennette Bihari, MD   8 Units at 06/18/17 1512  . insulin glargine (LANTUS) injection 30 Units  30 Units Subcutaneous QHS Lennette Bihari, MD   30 Units at 06/17/17 2321  . isosorbide mononitrate (IMDUR) 24 hr tablet 30 mg  30 mg Oral Daily Lennette Bihari, MD   30 mg at 06/18/17 1632  . metoprolol tartrate (LOPRESSOR) tablet 25 mg  25 mg Oral BID Lennette Bihari, MD   25 mg at 06/18/17 1632  . nitroGLYCERIN (NITROSTAT) SL tablet 0.4 mg  0.4 mg Sublingual Q5 Min x 3 PRN Lennette Bihari, MD      . ondansetron [facility]) injection 4 mg  4 mg Intravenous Q6H PRN Lennette Bihari, MD      . sodium chloride flush (NS) 0.9 % injection 3 mL  3 mL Intravenous Q12H Lennette Bihari, MD   3 mL at 06/18/17 1637  . sodium chloride flush (NS) 0.9 % injection 3 mL  3 mL Intravenous PRN Lennette Bihari, MD        Medications Prior to Admission  Medication Sig Dispense Refill Last Dose  . atorvastatin (LIPITOR) 40 MG tablet Take 40 mg by mouth daily at 6 PM.   06/16/2017 at Unknown time  . clopidogrel (PLAVIX) 75 MG tablet Take 75 mg by mouth daily.   06/17/2017 at 0630  .  enalapril (VASOTEC) 10 MG tablet Take 10 mg by mouth daily.   06/17/2017 at 0630  . glipiZIDE (GLUCOTROL) 5 MG tablet Take 5 mg by mouth  daily before breakfast.   06/17/2017  . insulin glargine (LANTUS) 100 UNIT/ML injection Inject 30 Units into the skin at bedtime.   06/16/2017 at Unknown time  . Melatonin 3 MG CAPS Take 6 mg by mouth at bedtime as needed (for sleep).   Past Month at Unknown time  . metFORMIN (GLUCOPHAGE) 1000 MG tablet Take 1,000 mg by mouth 2 (two) times daily with a meal.   06/17/2017 at Unknown time  . Naltrexone 380 MG SUSR Inject 380 mg into the muscle every 28 (twenty-eight) days.   06/04/2017  . naproxen sodium (ALEVE) 220 MG tablet Take 440 mg by mouth as needed (arthritis).   06/17/2017 at Unknown time  . paliperidone (INVEGA SUSTENNA) 156 MG/ML SUSY injection Inject 156 mg into the muscle every 28 (twenty-eight) days.   06/04/2017    Family History  Problem Relation Age of Onset  . CAD Mother   . Congestive Heart Failure Father      Review of Systems:   ROS      Cardiac Review of Systems: Y or  [    ]= no  Chest Pain [ y   ]  Resting SOB [   ] Exertional SOB  [ y ]  Orthopnea [  ]   Pedal Edema [   ]    Palpitations [  ] Syncope  [  ]   Presyncope [   ]  General Review of Systems: [Y] = yes [  ]=no Constitional: recent weight change [  ]; anorexia [  ]; fatigue [  ]; nausea [  ]; night sweats [  ]; fever [  ]; or chills [  ]                                                               Dental: Last Dentist visit: Unknown  Eye : blurred vision [  ]; diplopia [   ]; vision changes [  ];  Amaurosis fugax[  ]; Resp: cough [ y ];  wheezing[  ];  hemoptysis[  ]; shortness of breath[y  ]; paroxysmal nocturnal dyspnea[  ]; dyspnea on exertion[  ]; or orthopnea[  ];  GI:  gallstones[  ], vomiting[  ];  dysphagia[  ]; melena[  ];  hematochezia [  ]; heartburn[  ];   Hx of  Colonoscopy[  ]; GU: kidney stones [  ]; hematuria[  ];   dysuria [  ];  nocturia[  ];  history of     obstruction [  ]; urinary frequency [  ]             Skin: rash, swelling[  ];, hair loss[  ];  peripheral edema[  ];  or itching[   ]; Musculosketetal: myalgias[  ];  joint swelling[  ];  joint erythema[  ];  joint pain[  ];  back pain[  ];  Heme/Lymph: bruising[  ];  bleeding[  ];  anemia[  ];  Neuro: TIA[  ];  headaches[  ];  stroke[  ];  vertigo[  ];  seizures[  ];   paresthesias[  ];  difficulty walking[  ];  Psych:depression[ y ]; anxiety[  ];  Endocrine: diabetes[y  ];  thyroid dysfunction[  ];             Physical Exam: BP (!) 162/91   Pulse 65   Temp 98.2 F (36.8 C) (Oral)   Resp 15   Ht 6' (1.829 m)   Wt 202 lb 8 oz (91.9 kg) Comment: scale A  SpO2 99%   BMI 27.46 kg/m        Physical Exam  General: Well-nourished middle-aged male no acute distressAA  HEENT: Normocephalic pupils equal , dentition adequate Neck: Supple without JVD, adenopathy, or bruit Chest: Clear to auscultation, symmetrical breath sounds, no rhonchi, no tenderness             or deformity Cardiovascular: Regular rate and rhythm, no murmur, no gallop, peripheral pulses             palpable in all extremities Abdomen:  Soft, nontender, no palpable mass or organomegaly Extremities: Warm, well-perfused, no clubbing cyanosis edema or tenderness,              no venous stasis changes of the legs Rectal/GU: Deferred Neuro: Grossly non--focal and symmetrical throughout Skin: Clean and dry without rash or ulceration   Diagnostic Studies & Laboratory data:     Recent Radiology Findings:   Dg Chest 2 View  Result Date: 06/17/2017 CLINICAL DATA:  Chest pain EXAM: CHEST - 2 VIEW COMPARISON:  None. FINDINGS: Lungs are clear. Heart size and pulmonary vascularity are normal. No adenopathy. No pneumothorax. No bone lesions. IMPRESSION: No edema or consolidation. Electronically Signed   By: Bretta Bang III M.D.   On: 06/17/2017 10:43     I have independently reviewed the above radiologic studies and discussed with the patient   Recent Lab Findings: Lab Results  Component Value Date   WBC 5.0 06/18/2017   HGB 13.9 06/18/2017    HCT 40.9 06/18/2017   PLT 204 06/18/2017   GLUCOSE 142 (H) 06/18/2017   CHOL 224 (H) 06/18/2017   TRIG 150 (H) 06/18/2017   HDL 33 (L) 06/18/2017   LDLCALC 161 (H) 06/18/2017   NA 138 06/18/2017   K 4.3 06/18/2017   CL 106 06/18/2017   CREATININE 0.97 06/18/2017   BUN 13 06/18/2017   CO2 23 06/18/2017   INR 1.02 06/17/2017   HGBA1C 7.4 (H) 06/17/2017      Assessment / Plan:      Unstable angina Severe recurrent coronary disease with  left main stenosis Active smoking Diabetes mellitus History of stroke History of psych hospitalization for anxiety-depression   Patient will need Plavix washout over the weekend, IV heparin, preoperative carotid Dopplers, echocardiogram and chest CT scan.  Multivessel CABG scheduled for Tuesday, May 28  @ 06/18/2017 5:38 PM

## 2017-06-18 NOTE — Progress Notes (Signed)
Told on report from cath lab pt urinated about x2-3 while there.   [place]  Modelle Vollmer, RN

## 2017-06-18 NOTE — Progress Notes (Signed)
PT's belongings dropped off from 3E to 2C11.   [place]  Mette Southgate, RN

## 2017-06-18 NOTE — Progress Notes (Signed)
RN called lab to have labs drawn ASAP. Awaiting phlebotomist.

## 2017-06-18 NOTE — Progress Notes (Signed)
TR band removed at 1245. Site level zero. Dressing applied. Patient teaching done.

## 2017-06-18 NOTE — Progress Notes (Signed)
ANTICOAGULATION CONSULT NOTE - Initial Consult  Pharmacy Consult for heparin Indication: chest pain/ACS  No Known Allergies  Patient Measurements: Height: 6' (182.9 cm) Weight: 202 lb 8 oz (91.9 kg)(scale A) IBW/kg (Calculated) : 77.6 Heparin Dosing Weight: 91.9 kg  Vital Signs: Temp: 98 F (36.7 C) (05/23 1426) Temp Source: Oral (05/23 1426) BP: 159/95 (05/23 1426) Pulse Rate: 73 (05/23 1426)  Labs: Recent Labs    06/17/17 1030 06/17/17 2056 06/18/17 0244 06/18/17 0648  HGB 15.0 13.9  --  13.9  HCT 44.1 40.9  --  40.9  PLT 214 220  --  204  LABPROT  --  13.3  --   --   INR  --  1.02  --   --   CREATININE 0.95 1.10  --  0.97  TROPONINI  --  0.04* 0.05* 0.03*    Estimated Creatinine Clearance: 100 mL/min (by C-G formula based on SCr of 0.97 mg/dL).   Medical History: Past Medical History:  Diagnosis Date  . Diabetes mellitus without complication (HCC)   . Hypertension   . MI, acute, non ST segment elevation (HCC) 2007   stents x 2 2007 [facility] [place])  . Stroke [facility]) 2017    Medications:  Scheduled:  . aspirin EC  81 mg Oral Daily  . atorvastatin  80 mg Oral q1800  . enalapril  10 mg Oral Daily  . glipiZIDE  5 mg Oral QAC breakfast  . heparin  5,000 Units Subcutaneous Q8H  . insulin aspart  0-15 Units Subcutaneous TID WC  . insulin glargine  30 Units Subcutaneous QHS  . isosorbide mononitrate  30 mg Oral Daily  . metoprolol tartrate  25 mg Oral BID  . sodium chloride flush  3 mL Intravenous Q12H    Assessment: 52 yom found to have multivessel CAD, on plavix PTA - being evaluated for CABG, requiring washout (last dose 5/22@0630 ). No anticoag PTA.   Hgb 13.9, plt 204. No s/sx of bleeding. Sheath was removed on 5/23 at 0817.   Goal of Therapy:  Heparin level 0.3-0.7 units/ml Monitor platelets by anticoagulation protocol: Yes   Plan:  Start heparin infusion at 1100 units/hr Check anti-Xa level in 6 hours and daily while on heparin Continue to monitor  H&H and platelets  Girard Cooter, PharmD Clinical Pharmacist  Pager: [phone] Phone: [phone] 06/18/2017,2:53 PM

## 2017-06-18 NOTE — Progress Notes (Signed)
Explained to pt that we need a urine sample and to please notify the RN next time he thinks he can provide the sample.   [place]  Ruthe Roemer, RN

## 2017-06-18 NOTE — Interval H&P Note (Signed)
Cath Lab Visit (complete for each Cath Lab visit)  Clinical Evaluation Leading to the Procedure:   ACS: No.  Non-ACS:    Anginal Classification: CCS III  Anti-ischemic medical therapy: Minimal Therapy (1 class of medications)  Non-Invasive Test Results: No non-invasive testing performed  Prior CABG: No previous CABG      History and Physical Interval Note:  06/18/2017 7:38 AM  [name]  has presented today for surgery, with the diagnosis of angina  The various methods of treatment have been discussed with the patient and family. After consideration of risks, benefits and other options for treatment, the patient has consented to  Procedure(s): LEFT HEART CATH AND CORONARY ANGIOGRAPHY (N/A) as a surgical intervention .  The patient's history has been reviewed, patient examined, no change in status, stable for surgery.  I have reviewed the patient's chart and labs.  Questions were answered to the patient's satisfaction.     Nicki Guadalajara

## 2017-06-18 NOTE — Progress Notes (Signed)
3E will have a staff member bring pt's personal items down to 2C11.   [place]  Jaquay Morneault, RN

## 2017-06-19 ENCOUNTER — Encounter (HOSPITAL_COMMUNITY): Payer: Self-pay | Admitting: *Deleted

## 2017-06-19 ENCOUNTER — Observation Stay (HOSPITAL_BASED_OUTPATIENT_CLINIC_OR_DEPARTMENT_OTHER): Payer: Self-pay

## 2017-06-19 ENCOUNTER — Encounter (HOSPITAL_COMMUNITY): Payer: Non-veteran care

## 2017-06-19 ENCOUNTER — Observation Stay (HOSPITAL_COMMUNITY): Payer: Self-pay

## 2017-06-19 DIAGNOSIS — R079 Chest pain, unspecified: Secondary | ICD-10-CM

## 2017-06-19 DIAGNOSIS — I208 Other forms of angina pectoris: Secondary | ICD-10-CM

## 2017-06-19 DIAGNOSIS — I2510 Atherosclerotic heart disease of native coronary artery without angina pectoris: Secondary | ICD-10-CM | POA: Diagnosis not present

## 2017-06-19 DIAGNOSIS — I351 Nonrheumatic aortic (valve) insufficiency: Secondary | ICD-10-CM

## 2017-06-19 LAB — LIPID PANEL
Cholesterol: 199 mg/dL (ref 0–200)
HDL: 36 mg/dL — ABNORMAL LOW (ref >40)
LDL Cholesterol: 146 mg/dL — ABNORMAL HIGH (ref 0–99)
Total CHOL/HDL Ratio: 5.5 RATIO
Triglycerides: 83 mg/dL (ref <150)
VLDL: 17 mg/dL (ref 0–40)

## 2017-06-19 LAB — COMPREHENSIVE METABOLIC PANEL
ALT: 50 U/L (ref 17–63)
AST: 39 U/L (ref 15–41)
Albumin: 3.6 g/dL (ref 3.5–5.0)
Alkaline Phosphatase: 79 U/L (ref 38–126)
Anion gap: 8 (ref 5–15)
BUN: 14 mg/dL (ref 6–20)
CO2: 23 mmol/L (ref 22–32)
Calcium: 8.8 mg/dL — ABNORMAL LOW (ref 8.9–10.3)
Chloride: 106 mmol/L (ref 101–111)
Creatinine, Ser: 1.01 mg/dL (ref 0.61–1.24)
GFR calc Af Amer: >60 mL/min (ref >60)
GFR calc non Af Amer: >60 mL/min (ref >60)
Glucose, Bld: 128 mg/dL — ABNORMAL HIGH (ref 65–99)
Potassium: 4.1 mmol/L (ref 3.5–5.1)
Sodium: 137 mmol/L (ref 135–145)
Total Bilirubin: 0.9 mg/dL (ref 0.3–1.2)
Total Protein: 6.5 g/dL (ref 6.5–8.1)

## 2017-06-19 LAB — GLUCOSE, CAPILLARY
GLUCOSE-CAPILLARY: 238 mg/dL — AB (ref 65–99)
Glucose-Capillary: 150 mg/dL — ABNORMAL HIGH (ref 65–99)
Glucose-Capillary: 238 mg/dL — ABNORMAL HIGH (ref 65–99)
Glucose-Capillary: 150 mg/dL — ABNORMAL HIGH (ref 65–99)

## 2017-06-19 LAB — CBC
HCT: 39.8 % (ref 39.0–52.0)
HEMOGLOBIN: 13.3 g/dL (ref 13.0–17.0)
MCH: 29.8 pg (ref 26.0–34.0)
MCHC: 33.4 g/dL (ref 30.0–36.0)
MCV: 89.2 fL (ref 78.0–100.0)
PLATELETS: 218 10*3/uL (ref 150–400)
RBC: 4.46 MIL/uL (ref 4.22–5.81)
RDW: 14.7 % (ref 11.5–15.5)
WBC: 6 10*3/uL (ref 4.0–10.5)

## 2017-06-19 LAB — HEPARIN LEVEL (UNFRACTIONATED)
HEPARIN UNFRACTIONATED: 0.29 [IU]/mL — AB (ref 0.30–0.70)
Heparin Unfractionated: 0.6 IU/mL (ref 0.30–0.70)

## 2017-06-19 LAB — ECHOCARDIOGRAM COMPLETE
HEIGHTINCHES: 72 in
Weight: 3287.5 oz

## 2017-06-19 LAB — PLATELET INHIBITION P2Y12: Platelet Function  P2Y12: 203 [PRU] (ref 194–418)

## 2017-06-19 LAB — PROTIME-INR
INR: 1.03
Prothrombin Time: 13.4 seconds (ref 11.4–15.2)

## 2017-06-19 LAB — TSH: TSH: 2.353 u[IU]/mL (ref 0.350–4.500)

## 2017-06-19 MED ORDER — INSULIN GLARGINE 100 UNIT/ML ~~LOC~~ SOLN
34.0000 [IU] | Freq: Every day | SUBCUTANEOUS | Status: DC
  Administered 2017-06-19 – 2017-06-22 (×4): 34 [IU] via SUBCUTANEOUS
  Filled 2017-06-19 (×4): qty 0.34

## 2017-06-19 MED FILL — Heparin Sod (Porcine)-NaCl IV Soln 1000 Unit/500ML-0.9%: INTRAVENOUS | Qty: 1000 | Status: AC

## 2017-06-19 NOTE — Progress Notes (Signed)
ANTICOAGULATION CONSULT NOTE Pharmacy Consult for heparin Indication: chest pain/ACS  No Known Allergies  Patient Measurements: Height: 6' (182.9 cm) Weight: 202 lb 8 oz (91.9 kg)(scale A) IBW/kg (Calculated) : 77.6 Heparin Dosing Weight: 91.9 kg  Vital Signs: Temp: 98 F (36.7 C) (05/24 0330) Temp Source: Oral (05/24 0330) BP: 112/67 (05/24 0300) Pulse Rate: 52 (05/24 0300)  Labs: Recent Labs    06/17/17 2056 06/18/17 0244 06/18/17 0648 06/19/17 0234  HGB 13.9  --  13.9 13.3  HCT 40.9  --  40.9 39.8  PLT 220  --  204 218  LABPROT 13.3  --   --  13.4  INR 1.02  --   --  1.03  HEPARINUNFRC  --   --   --  0.29*  CREATININE 1.10  --  0.97 1.01  TROPONINI 0.04* 0.05* 0.03*  --     Estimated Creatinine Clearance: 96 mL/min (by C-G formula based on SCr of 1.01 mg/dL).  Assessment: 52 yo male with CAD, s/p cath and awaiting CABG, for heparin    Goal of Therapy:  Heparin level 0.3-0.7 units/ml Monitor platelets by anticoagulation protocol: Yes   Plan:  Increase Heparin 1200 units/hr  Geannie Risen, PharmD, BCPS  06/19/2017,4:06 AM

## 2017-06-19 NOTE — Progress Notes (Signed)
Progress Note  Patient Name: Marcus SCHNECK Sr. Date of Encounter: 06/19/2017  Primary Cardiologist: Nanetta Batty, MD   Subjective   Mr. Thibault had cardiac catheterization performed yesterday by Dr. Tresa Endo.  This revealed left main disease as well as LAD and circumflex disease with only mild RCA disease.  His EF was preserved.  He was seen by Dr. Donata Clay for consideration of bypass grafting which is scheduled for Tuesday after Plavix washout.  He has had no further chest pain on IV heparin.  Inpatient Medications    Scheduled Meds: . aspirin EC  81 mg Oral Daily  . atorvastatin  80 mg Oral q1800  . Chlorhexidine Gluconate Cloth  6 each Topical Daily  . enalapril  10 mg Oral Daily  . glipiZIDE  5 mg Oral QAC breakfast  . insulin aspart  0-15 Units Subcutaneous TID WC  . insulin glargine  34 Units Subcutaneous QHS  . isosorbide mononitrate  30 mg Oral Daily  . metoprolol tartrate  25 mg Oral BID  . mupirocin ointment  1 application Nasal BID  . sodium chloride flush  3 mL Intravenous Q12H   Continuous Infusions: . sodium chloride Stopped (06/18/17 1500)  . sodium chloride    . sodium chloride Stopped (06/19/17 0131)  . heparin 1,200 Units/hr (06/19/17 0422)   PRN Meds: sodium chloride, acetaminophen, diazepam, nitroGLYCERIN, ondansetron (ZOFRAN) IV, sodium chloride flush   Vital Signs    Vitals:   06/19/17 0300 06/19/17 0330 06/19/17 0603 06/19/17 0737  BP: 112/67   114/75  Pulse: (!) 52   60  Resp: 13   14  Temp:  98 F (36.7 C)  97.8 F (36.6 C)  TempSrc:  Oral  Oral  SpO2: 98%   100%  Weight:   205 lb 7.5 oz (93.2 kg)   Height:        Intake/Output Summary (Last 24 hours) at 06/19/2017 0917 Last data filed at 06/19/2017 0839 Gross per 24 hour  Intake 2367.13 ml  Output 1625 ml  Net 742.13 ml   Filed Weights   06/17/17 2002 06/18/17 0503 06/19/17 0603  Weight: 208 lb (94.3 kg) 202 lb 8 oz (91.9 kg) 205 lb 7.5 oz (93.2 kg)    Telemetry    Sinus  rhythm- Personally Reviewed  ECG    Sinus bradycardia 59 without ST or T wave changes.- Personally Reviewed  Physical Exam   GEN: No acute distress.   Neck: No JVD Cardiac: RRR, no murmurs, rubs, or gallops.  Respiratory: Clear to auscultation bilaterally. GI: Soft, nontender, non-distended  MS: No edema; No deformity. Neuro:  Nonfocal  Psych: Normal affect   No change in physical exam since yesterday  Labs    Chemistry Recent Labs  Lab 06/17/17 1030 06/17/17 2056 06/18/17 0648 06/19/17 0234  NA 136  --  138 137  K 4.5  --  4.3 4.1  CL 104  --  106 106  CO2 20*  --  23 23  GLUCOSE 237*  --  142* 128*  BUN 12  --  13 14  CREATININE 0.95 1.10 0.97 1.01  CALCIUM 9.3  --  8.9 8.8*  PROT  --   --   --  6.5  ALBUMIN  --   --   --  3.6  AST  --   --   --  39  ALT  --   --   --  50  ALKPHOS  --   --   --  79  BILITOT  --   --   --  0.9  GFRNONAA >60 >60 >60 >60  GFRAA >60 >60 >60 >60  ANIONGAP 12  --  9 8     Hematology Recent Labs  Lab 06/17/17 2056 06/18/17 0648 06/19/17 0234  WBC 6.1 5.0 6.0  RBC 4.62 4.57 4.46  HGB 13.9 13.9 13.3  HCT 40.9 40.9 39.8  MCV 88.5 89.5 89.2  MCH 30.1 30.4 29.8  MCHC 34.0 34.0 33.4  RDW 15.0 14.8 14.7  PLT 220 204 218    Cardiac Enzymes Recent Labs  Lab 06/17/17 2056 06/18/17 0244 06/18/17 0648  TROPONINI 0.04* 0.05* 0.03*    Recent Labs  Lab 06/17/17 1053 06/17/17 1417  TROPIPOC 0.05 0.04     BNPNo results for input(s): BNP, PROBNP in the last 168 hours.   DDimer No results for input(s): DDIMER in the last 168 hours.   Radiology    Dg Chest 2 View  Result Date: 06/17/2017 CLINICAL DATA:  Chest pain EXAM: CHEST - 2 VIEW COMPARISON:  None. FINDINGS: Lungs are clear. Heart size and pulmonary vascularity are normal. No adenopathy. No pneumothorax. No bone lesions. IMPRESSION: No edema or consolidation. Electronically Signed   By: Bretta Bang III M.D.   On: 06/17/2017 10:43   Ct Head Wo  Contrast  Result Date: 06/19/2017 CLINICAL DATA:  Pre catheterization evaluation.  Follow-up CTA. EXAM: CT HEAD WITHOUT CONTRAST TECHNIQUE: Contiguous axial images were obtained from the base of the skull through the vertex without intravenous contrast. COMPARISON:  None. FINDINGS: Brain: No acute intracranial abnormality. Specifically, no hemorrhage, hydrocephalus, mass lesion, acute infarction, or significant intracranial injury. Vascular: No hyperdense vessel or unexpected calcification. Skull: No acute calvarial abnormality. Sinuses/Orbits: Visualized paranasal sinuses and mastoids clear. Orbital soft tissues unremarkable. Other: None IMPRESSION: No intracranial abnormality. Electronically Signed   By: Charlett Nose M.D.   On: 06/19/2017 09:04    Cardiac Studies   Cardiac catheterization (06/18/2017)  Conclusion     Ost 1st Mrg to 1st Mrg lesion is 90% stenosed.  Prox LAD to Mid LAD lesion is 50% stenosed.  Ost 1st Diag lesion is 80% stenosed.  Prox RCA lesion is 30% stenosed.  Mid RCA-1 lesion is 60% stenosed.  Mid RCA-2 lesion is 20% stenosed.  The left ventricular ejection fraction is 50-55% by visual estimate.  LV end diastolic pressure is normal.  The left ventricular systolic function is normal.  Mid LM to Ost LAD lesion is 80% stenosed.  Ost Cx to Prox Cx lesion is 95% stenosed.  Mid LM lesion is 80% stenosed.   Multivessel CAD with 80% distal left main stenosis with 80% ostial in-stent restenosis of a previously placed LAD stent and 95% ostial circumflex/Ramus stenosis.  The LAD has a stent extending from the ostium to the mid segment and another stent arises within the stented segment to the first diagonal vessel.  There is proximal in-stent restenosis of 80% extending to the ostium and distal stent restenosis of 50%.  There is 80% ostial stenosis in the stent in the diagonal vessel.  Ramus/circumflex has 95% stenosis.  The major vessel is a ramus vessel which  extends towards the apex.  The proximal branch is an AV groove circumflex which is small caliber and has 95% stenosis.  There is collateralization of this distal circumflex marginal by the via the PLA branch of the RCA.  The right coronary artery is tortuous and is 30% proximal stenosis, 50 to 60% stenosis  and a band of the vessel followed by 20% stenosis.  The PLA collateralizes the distal circumflex marginal branch.  RECOMMENDATION: Patient has been on Plavix.  Plavix will be held for Plavix washout.  Surgical consultation will be obtained.  Will initiate heparin therapy 8 hours following radial sheath removal and aggressive medical therapy until CABG surgery     Patient Profile     52 y.o. male status post remote PCI and stenting back in 2007.  He is a retired and [facility].  He does continue smoking as well as issues with schizophrenia.  Because of accelerated angina he underwent cardiac catheterization yesterday by Dr. Tresa Endo revealing left main disease with disease in the LAD and circumflex as well as ramus branch as well.  This was considered surgical anatomy and Dr. Zenaida Niece trigt was consulted who agreed and has placed him on the surgical schedule for Tuesday, 06/24/2017, after Plavix washout.  He remains on IV heparin and is pain-free.  Assessment & Plan    1: Coronary artery disease- prior history of stenting back in 07 now with accelerated angina and cardiac catheterization revealing left main disease as well as disease in the LAD, ramus branch and circumflex with normal LV function.  Plavix has been on hold on his washing out.  He is remained pain-free on IV heparin.  Will require coronary artery bypass grafting scheduled by Dr. Zenaida Niece trigt for 06/24/2017.  2: Essential hypertension- controlled on current medications  3: Hyperlipidemia- on statin therapy  For questions or updates, please contact [facility] Please consult [email or web address] for contact info under  Cardiology/STEMI.      Signed, Nanetta Batty, MD  06/19/2017, 9:17 AM

## 2017-06-19 NOTE — Progress Notes (Signed)
[id number] Did not walk pt as he has LM disease. Gave IS and pt demonstrated 2200 ml correctly. Discussed importance of IS and mobility after surgery. Discussed staying in the tube and sternal precautions. Wrote down how to view preop video. Gave [facility] booklet , care guide and in the tube handout. Pt stated he lives in [place] [facility] and may need Rehab after surgery as he will not have 24/7 care at discharge. Discussed with pt that case manager and SW will discuss discharge needs with him. We will follow up after surgery.Luetta Nutting RN BSN 06/19/2017 2:09 PM

## 2017-06-19 NOTE — Progress Notes (Signed)
Inpatient Diabetes Program Recommendations  AACE/ADA: New Consensus Statement on Inpatient Glycemic Control (2015)  Target Ranges:  Prepandial:   less than 140 mg/dL      Peak postprandial:   less than 180 mg/dL (1-2 hours)      Critically ill patients:  140 - 180 mg/dL   Lab Results  Component Value Date   GLUCAP 150 (H) 06/19/2017   HGBA1C 7.4 (H) 06/17/2017    Review of Glycemic Control Results for JENSYN, CAMBRIA SR. (MRN [id number]) as of 06/19/2017 12:29  Ref. Range 06/18/2017 17:18 06/18/2017 20:50 06/19/2017 07:36  Glucose-Capillary Latest Ref Range: 65 - 99 mg/dL 409 (H) 811 (H) 914 (H)   Diabetes history: Type 2 DM Outpatient Diabetes medications: Glipizide 5 mg QAM, Lantus 30 units QHS, Metformin 1000 mg BID Current orders for Inpatient glycemic control: Lantus 34 units QD, Novolog 0-15 units TID, Glipizide 5 mg QAM  Inpatient Diabetes Program Recommendations:    Noted increase in post prandials exceeding >180 mg/dL. May want consider adding the carb modified portion to diet? Could consider Novolog 3 units TID (assuming that patient consumes >50% of meal).   Thanks, Lujean Rave, MSN, RNC-OB Diabetes Coordinator [phone] (8a-5p)

## 2017-06-19 NOTE — Progress Notes (Signed)
ANTICOAGULATION CONSULT NOTE Pharmacy Consult for heparin Indication: chest pain/ACS  No Known Allergies  Patient Measurements: Height: 6' (182.9 cm) Weight: 205 lb 7.5 oz (93.2 kg) IBW/kg (Calculated) : 77.6 Heparin Dosing Weight: 91.9 kg  Vital Signs: Temp: 97.8 F (36.6 C) (05/24 1122) Temp Source: Oral (05/24 1122) BP: 131/76 (05/24 1122) Pulse Rate: 66 (05/24 1122)  Labs: Recent Labs    06/17/17 2056 06/18/17 0244 06/18/17 0648 06/19/17 0234 06/19/17 1104  HGB 13.9  --  13.9 13.3  --   HCT 40.9  --  40.9 39.8  --   PLT 220  --  204 218  --   LABPROT 13.3  --   --  13.4  --   INR 1.02  --   --  1.03  --   HEPARINUNFRC  --   --   --  0.29* 0.60  CREATININE 1.10  --  0.97 1.01  --   TROPONINI 0.04* 0.05* 0.03*  --   --     Estimated Creatinine Clearance: 103.7 mL/min (by C-G formula based on SCr of 1.01 mg/dL).  Assessment: 52 yo male with CAD, s/p cath and awaiting CABG, for heparin. Initial heparin level came back at 0.29 - after rate increase, level increased to 0.6, on 1200 units/hr. No infusion issues or s/sx of bleeding per nursing. Hgb 13.3, plt 218 -stable. Plan for CABG possibly 5/29 per notes.    Goal of Therapy:  Heparin level 0.3-0.7 units/ml Monitor platelets by anticoagulation protocol: Yes   Plan:  Continue heparin at 1200 units/hr Monitor daily HL, CBC, s/sx of bleeding  Girard Cooter, PharmD Clinical Pharmacist  Pager: [phone] Phone: [phone] 06/19/2017,12:52 PM

## 2017-06-19 NOTE — Progress Notes (Signed)
  Echocardiogram 2D Echocardiogram has been performed.  Pieter Partridge 06/19/2017, 8:38 AM

## 2017-06-20 DIAGNOSIS — I10 Essential (primary) hypertension: Secondary | ICD-10-CM | POA: Diagnosis not present

## 2017-06-20 DIAGNOSIS — R079 Chest pain, unspecified: Secondary | ICD-10-CM | POA: Diagnosis not present

## 2017-06-20 DIAGNOSIS — I208 Other forms of angina pectoris: Secondary | ICD-10-CM | POA: Diagnosis not present

## 2017-06-20 DIAGNOSIS — E785 Hyperlipidemia, unspecified: Secondary | ICD-10-CM

## 2017-06-20 DIAGNOSIS — I2510 Atherosclerotic heart disease of native coronary artery without angina pectoris: Secondary | ICD-10-CM | POA: Diagnosis not present

## 2017-06-20 LAB — CBC
HEMATOCRIT: 39.4 % (ref 39.0–52.0)
HEMOGLOBIN: 13.3 g/dL (ref 13.0–17.0)
MCH: 30.4 pg (ref 26.0–34.0)
MCHC: 33.8 g/dL (ref 30.0–36.0)
MCV: 90.2 fL (ref 78.0–100.0)
Platelets: 199 10*3/uL (ref 150–400)
RBC: 4.37 MIL/uL (ref 4.22–5.81)
RDW: 14.6 % (ref 11.5–15.5)
WBC: 6.7 10*3/uL (ref 4.0–10.5)

## 2017-06-20 LAB — GLUCOSE, CAPILLARY
GLUCOSE-CAPILLARY: 123 mg/dL — AB (ref 65–99)
GLUCOSE-CAPILLARY: 266 mg/dL — AB (ref 65–99)
Glucose-Capillary: 143 mg/dL — ABNORMAL HIGH (ref 65–99)
Glucose-Capillary: 206 mg/dL — ABNORMAL HIGH (ref 65–99)

## 2017-06-20 LAB — HEPARIN LEVEL (UNFRACTIONATED): Heparin Unfractionated: 0.79 IU/mL — ABNORMAL HIGH (ref 0.30–0.70)

## 2017-06-20 NOTE — Progress Notes (Signed)
Progress Note  Patient Name: Marcus RAMDASS Sr. Date of Encounter: 06/20/2017  Primary Cardiologist: Nanetta Batty, MD   Subjective   No events overnight - plan for CABG on Tuesday after plavix washout.  Inpatient Medications    Scheduled Meds: . aspirin EC  81 mg Oral Daily  . atorvastatin  80 mg Oral q1800  . Chlorhexidine Gluconate Cloth  6 each Topical Daily  . enalapril  10 mg Oral Daily  . glipiZIDE  5 mg Oral QAC breakfast  . insulin aspart  0-15 Units Subcutaneous TID WC  . insulin glargine  34 Units Subcutaneous QHS  . isosorbide mononitrate  30 mg Oral Daily  . metoprolol tartrate  25 mg Oral BID  . mupirocin ointment  1 application Nasal BID  . sodium chloride flush  3 mL Intravenous Q12H   Continuous Infusions: . sodium chloride Stopped (06/18/17 1500)  . sodium chloride    . sodium chloride Stopped (06/19/17 0131)  . heparin 1,100 Units/hr (06/20/17 0847)   PRN Meds: sodium chloride, acetaminophen, diazepam, nitroGLYCERIN, ondansetron (ZOFRAN) IV, sodium chloride flush   Vital Signs    Vitals:   06/20/17 0400 06/20/17 0747 06/20/17 0845 06/20/17 1115  BP: 133/85 120/89 132/81 107/74  Pulse: (!) 51 65 61 62  Resp: Temp:  97.9 F (36.6 C)  98.1 F (36.7 C)  TempSrc:  Oral  Oral  SpO2: 99% 97%  98%  Weight:      Height:        Intake/Output Summary (Last 24 hours) at 06/20/2017 1131 Last data filed at 06/20/2017 1100 Gross per 24 hour  Intake 691.08 ml  Output 2225 ml  Net -1533.92 ml   Filed Weights   06/18/17 0503 06/19/17 0603 06/20/17 0347  Weight: 202 lb 8 oz (91.9 kg) 205 lb 7.5 oz (93.2 kg) 204 lb 8 oz (92.8 kg)    Telemetry    Sinus rhythm- Personally Reviewed  ECG    N/A  Physical Exam   General appearance: alert and no distress Neck: no carotid bruit, no JVD and thyroid not enlarged, symmetric, no tenderness/mass/nodules Lungs: clear to auscultation bilaterally Heart: regular rate and rhythm Abdomen: soft,  non-tender; bowel sounds normal; no masses,  no organomegaly Extremities: extremities normal, atraumatic, no cyanosis or edema Pulses: 2+ and symmetric Skin: Skin color, texture, turgor normal. No rashes or lesions Neurologic: Grossly normal Psych: Pleasant   Labs    Chemistry Recent Labs  Lab 06/17/17 1030 06/17/17 2056 06/18/17 0648 06/19/17 0234  NA 136  --  138 137  K 4.5  --  4.3 4.1  CL 104  --  106 106  CO2 20*  --  23 23  GLUCOSE 237*  --  142* 128*  BUN 12  --  13 14  CREATININE 0.95 1.10 0.97 1.01  CALCIUM 9.3  --  8.9 8.8*  PROT  --   --   --  6.5  ALBUMIN  --   --   --  3.6  AST  --   --   --  39  ALT  --   --   --  50  ALKPHOS  --   --   --  79  BILITOT  --   --   --  0.9  GFRNONAA >60 >60 >60 >60  GFRAA >60 >60 >60 >60  ANIONGAP 12  --  9 8     Hematology Recent Labs  Lab 06/18/17  1610 06/19/17 0234 06/20/17 0232  WBC 5.0 6.0 6.7  RBC 4.57 4.46 4.37  HGB 13.9 13.3 13.3  HCT 40.9 39.8 39.4  MCV 89.5 89.2 90.2  MCH 30.4 29.8 30.4  MCHC 34.0 33.4 33.8  RDW 14.8 14.7 14.6  PLT 204 218 199    Cardiac Enzymes Recent Labs  Lab 06/17/17 2056 06/18/17 0244 06/18/17 0648  TROPONINI 0.04* 0.05* 0.03*    Recent Labs  Lab 06/17/17 1053 06/17/17 1417  TROPIPOC 0.05 0.04     BNPNo results for input(s): BNP, PROBNP in the last 168 hours.   DDimer No results for input(s): DDIMER in the last 168 hours.   Radiology    Ct Head Wo Contrast  Result Date: 06/19/2017 CLINICAL DATA:  Pre catheterization evaluation.  Follow-up CTA. EXAM: CT HEAD WITHOUT CONTRAST TECHNIQUE: Contiguous axial images were obtained from the base of the skull through the vertex without intravenous contrast. COMPARISON:  None. FINDINGS: Brain: No acute intracranial abnormality. Specifically, no hemorrhage, hydrocephalus, mass lesion, acute infarction, or significant intracranial injury. Vascular: No hyperdense vessel or unexpected calcification. Skull: No acute calvarial  abnormality. Sinuses/Orbits: Visualized paranasal sinuses and mastoids clear. Orbital soft tissues unremarkable. Other: None IMPRESSION: No intracranial abnormality. Electronically Signed   By: Charlett Nose M.D.   On: 06/19/2017 09:04    Cardiac Studies   Cardiac catheterization (06/18/2017)  Conclusion     Ost 1st Mrg to 1st Mrg lesion is 90% stenosed.  Prox LAD to Mid LAD lesion is 50% stenosed.  Ost 1st Diag lesion is 80% stenosed.  Prox RCA lesion is 30% stenosed.  Mid RCA-1 lesion is 60% stenosed.  Mid RCA-2 lesion is 20% stenosed.  The left ventricular ejection fraction is 50-55% by visual estimate.  LV end diastolic pressure is normal.  The left ventricular systolic function is normal.  Mid LM to Ost LAD lesion is 80% stenosed.  Ost Cx to Prox Cx lesion is 95% stenosed.  Mid LM lesion is 80% stenosed.   Multivessel CAD with 80% distal left main stenosis with 80% ostial in-stent restenosis of a previously placed LAD stent and 95% ostial circumflex/Ramus stenosis.  The LAD has a stent extending from the ostium to the mid segment and another stent arises within the stented segment to the first diagonal vessel.  There is proximal in-stent restenosis of 80% extending to the ostium and distal stent restenosis of 50%.  There is 80% ostial stenosis in the stent in the diagonal vessel.  Ramus/circumflex has 95% stenosis.  The major vessel is a ramus vessel which extends towards the apex.  The proximal branch is an AV groove circumflex which is small caliber and has 95% stenosis.  There is collateralization of this distal circumflex marginal by the via the PLA branch of the RCA.  The right coronary artery is tortuous and is 30% proximal stenosis, 50 to 60% stenosis and a band of the vessel followed by 20% stenosis.  The PLA collateralizes the distal circumflex marginal branch.  RECOMMENDATION: Patient has been on Plavix.  Plavix will be held for Plavix washout.  Surgical  consultation will be obtained.  Will initiate heparin therapy 8 hours following radial sheath removal and aggressive medical therapy until CABG surgery     Patient Profile     52 y.o. male status post remote PCI and stenting back in 2007.  He is a retired and [facility].  He does continue smoking as well as issues with schizophrenia.  Because of accelerated angina he underwent cardiac catheterization yesterday by Dr. Tresa Endo revealing left main disease with disease in the LAD and circumflex as well as ramus branch as well.  This was considered surgical anatomy and Dr. Zenaida Niece trigt was consulted who agreed and has placed him on the surgical schedule for Tuesday, 06/24/2017, after Plavix washout.  He remains on IV heparin and is pain-free.  Assessment & Plan    1: Coronary artery disease- prior history of stenting back in 07 now with accelerated angina and cardiac catheterization revealing left main disease as well as disease in the LAD, ramus branch and circumflex with normal LV function.  Plavix has been on hold on his washing out.  He is remained pain-free on IV heparin.  Will require coronary artery bypass grafting scheduled by Dr. Zenaida Niece trigt for 06/24/2017.  - plan for CABG Tuesday  2: Essential hypertension- controlled on current medications  3: Hyperlipidemia- on statin therapy  For questions or updates, please contact [facility] Please consult [email or web address] for contact info under Cardiology/STEMI.   Chrystie Nose, MD, [facility], FACP  [place]  [facility]  Medical Director of the Advanced Lipid Disorders &  Cardiovascular Risk Reduction Clinic Diplomate of the American Board of Clinical Lipidology Attending Cardiologist  Direct Dial: [phone]  Fax: [phone]  Website:  [email or web address]  Chrystie Nose, MD  06/20/2017, 11:31 AM

## 2017-06-20 NOTE — Progress Notes (Signed)
ANTICOAGULATION CONSULT NOTE Pharmacy Consult for heparin Indication: chest pain/ACS  No Known Allergies  Patient Measurements: Height: 6' (182.9 cm) Weight: 205 lb 7.5 oz (93.2 kg) IBW/kg (Calculated) : 77.6 Heparin Dosing Weight: 91.9 kg  Vital Signs: Temp: 97.5 F (36.4 C) (05/25 0347) Temp Source: Oral (05/25 0347) BP: 124/85 (05/25 0347) Pulse Rate: 52 (05/25 0347)  Labs: Recent Labs    06/17/17 2056 06/18/17 0244 06/18/17 0648 06/19/17 0234 06/19/17 1104 06/20/17 0232  HGB 13.9  --  13.9 13.3  --  13.3  HCT 40.9  --  40.9 39.8  --  39.4  PLT 220  --  204 218  --  199  LABPROT 13.3  --   --  13.4  --   --   INR 1.02  --   --  1.03  --   --   HEPARINUNFRC  --   --   --  0.29* 0.60 0.79*  CREATININE 1.10  --  0.97 1.01  --   --   TROPONINI 0.04* 0.05* 0.03*  --   --   --     Estimated Creatinine Clearance: 103.7 mL/min (by C-G formula based on SCr of 1.01 mg/dL).  Assessment: 52 yo male with CAD, s/p cath and awaiting CABG, for heparin    Goal of Therapy:  Heparin level 0.3-0.7 units/ml Monitor platelets by anticoagulation protocol: Yes   Plan:  Decrease Heparin 1100 units/hr  Geannie Risen, PharmD, BCPS  06/20/2017,3:50 AM

## 2017-06-21 ENCOUNTER — Inpatient Hospital Stay (HOSPITAL_COMMUNITY): Payer: Self-pay

## 2017-06-21 DIAGNOSIS — R079 Chest pain, unspecified: Secondary | ICD-10-CM | POA: Diagnosis not present

## 2017-06-21 DIAGNOSIS — Z01810 Encounter for preprocedural cardiovascular examination: Secondary | ICD-10-CM

## 2017-06-21 DIAGNOSIS — I2510 Atherosclerotic heart disease of native coronary artery without angina pectoris: Secondary | ICD-10-CM | POA: Diagnosis not present

## 2017-06-21 DIAGNOSIS — I208 Other forms of angina pectoris: Secondary | ICD-10-CM | POA: Diagnosis not present

## 2017-06-21 DIAGNOSIS — I10 Essential (primary) hypertension: Secondary | ICD-10-CM | POA: Diagnosis not present

## 2017-06-21 LAB — BASIC METABOLIC PANEL
Anion gap: 8 (ref 5–15)
BUN: 12 mg/dL (ref 6–20)
CALCIUM: 9.2 mg/dL (ref 8.9–10.3)
CO2: 24 mmol/L (ref 22–32)
CREATININE: 1.05 mg/dL (ref 0.61–1.24)
Chloride: 108 mmol/L (ref 101–111)
GFR calc Af Amer: >60 mL/min (ref >60)
GFR calc non Af Amer: >60 mL/min (ref >60)
GLUCOSE: 106 mg/dL — AB (ref 65–99)
Potassium: 3.9 mmol/L (ref 3.5–5.1)
SODIUM: 140 mmol/L (ref 135–145)

## 2017-06-21 LAB — GLUCOSE, CAPILLARY
GLUCOSE-CAPILLARY: 164 mg/dL — AB (ref 65–99)
GLUCOSE-CAPILLARY: 256 mg/dL — AB (ref 65–99)
Glucose-Capillary: 95 mg/dL (ref 65–99)
Glucose-Capillary: 201 mg/dL — ABNORMAL HIGH (ref 65–99)

## 2017-06-21 LAB — CBC
HEMATOCRIT: 39.5 % (ref 39.0–52.0)
HEMOGLOBIN: 13.3 g/dL (ref 13.0–17.0)
MCH: 30.4 pg (ref 26.0–34.0)
MCHC: 33.7 g/dL (ref 30.0–36.0)
MCV: 90.2 fL (ref 78.0–100.0)
Platelets: 207 10*3/uL (ref 150–400)
RBC: 4.38 MIL/uL (ref 4.22–5.81)
RDW: 14.7 % (ref 11.5–15.5)
WBC: 6.7 10*3/uL (ref 4.0–10.5)

## 2017-06-21 LAB — HEPARIN LEVEL (UNFRACTIONATED): Heparin Unfractionated: 0.66 IU/mL (ref 0.30–0.70)

## 2017-06-21 MED ORDER — SODIUM CHLORIDE 0.9 % IV SOLN
1500.0000 mg | INTRAVENOUS | Status: AC
Start: 2017-06-23 — End: 2017-06-23
  Administered 2017-06-23: 1500 mg via INTRAVENOUS
  Filled 2017-06-21: qty 1500

## 2017-06-21 MED ORDER — POTASSIUM CHLORIDE 2 MEQ/ML IV SOLN
80.0000 meq | INTRAVENOUS | Status: DC
  Filled 2017-06-21: qty 40

## 2017-06-21 MED ORDER — TRANEXAMIC ACID 1000 MG/10ML IV SOLN
1.5000 mg/kg/h | INTRAVENOUS | Status: AC
  Administered 2017-06-23: 1.5 mg/kg/h via INTRAVENOUS
  Filled 2017-06-21: qty 25

## 2017-06-21 MED ORDER — MAGNESIUM SULFATE 50 % IJ SOLN
40.0000 meq | INTRAMUSCULAR | Status: DC
  Filled 2017-06-21: qty 9.85

## 2017-06-21 MED ORDER — SODIUM CHLORIDE 0.9 % IV SOLN
INTRAVENOUS | Status: DC
  Filled 2017-06-21: qty 30

## 2017-06-21 MED ORDER — CEFUROXIME SODIUM 1.5 G IV SOLR
1.5000 g | INTRAVENOUS | Status: AC
Start: 2017-06-23 — End: 2017-06-23
  Administered 2017-06-23: 1.5 g via INTRAVENOUS
  Filled 2017-06-21: qty 1.5

## 2017-06-21 MED ORDER — PLASMA-LYTE 148 IV SOLN
INTRAVENOUS | Status: AC
  Administered 2017-06-23: 500 mL
  Filled 2017-06-21: qty 2.5

## 2017-06-21 MED ORDER — INSULIN REGULAR HUMAN 100 UNIT/ML IJ SOLN
INTRAMUSCULAR | Status: AC
Start: 2017-06-23 — End: 2017-06-23
  Administered 2017-06-23: 1.1 [IU]/h via INTRAVENOUS
  Filled 2017-06-21: qty 1

## 2017-06-21 MED ORDER — SODIUM CHLORIDE 0.9 % IV SOLN
750.0000 mg | INTRAVENOUS | Status: DC
  Filled 2017-06-21: qty 750

## 2017-06-21 MED ORDER — TRANEXAMIC ACID (OHS) BOLUS VIA INFUSION
15.0000 mg/kg | INTRAVENOUS | Status: AC
  Administered 2017-06-23: 1384.5 mg via INTRAVENOUS
  Filled 2017-06-21: qty 1385

## 2017-06-21 MED ORDER — SODIUM CHLORIDE 0.9 % IV SOLN
30.0000 ug/min | INTRAVENOUS | Status: DC
  Filled 2017-06-21: qty 2

## 2017-06-21 MED ORDER — DOPAMINE-DEXTROSE 3.2-5 MG/ML-% IV SOLN
0.0000 ug/kg/min | INTRAVENOUS | Status: DC
  Filled 2017-06-21: qty 250

## 2017-06-21 MED ORDER — DEXMEDETOMIDINE HCL IN NACL 400 MCG/100ML IV SOLN
0.1000 ug/kg/h | INTRAVENOUS | Status: DC
  Filled 2017-06-21: qty 100

## 2017-06-21 MED ORDER — NITROGLYCERIN IN D5W 200-5 MCG/ML-% IV SOLN
2.0000 ug/min | INTRAVENOUS | Status: AC
  Administered 2017-06-23: 10 ug/min via INTRAVENOUS
  Filled 2017-06-21: qty 250

## 2017-06-21 MED ORDER — DEXTROSE 5 % IV SOLN
0.0000 ug/min | INTRAVENOUS | Status: DC
  Filled 2017-06-21: qty 4

## 2017-06-21 MED ORDER — TRANEXAMIC ACID (OHS) PUMP PRIME SOLUTION
2.0000 mg/kg | INTRAVENOUS | Status: DC
  Filled 2017-06-21: qty 1.85

## 2017-06-21 NOTE — Progress Notes (Signed)
ANTICOAGULATION CONSULT NOTE Pharmacy Consult for heparin Indication: chest pain/ACS  No Known Allergies  Patient Measurements: Height: 6' (182.9 cm) Weight: 203 lb 7.8 oz (92.3 kg) IBW/kg (Calculated) : 77.6 Heparin Dosing Weight: 91.9 kg  Vital Signs: Temp: 97.9 F (36.6 C) (05/26 0420) Temp Source: Oral (05/26 0420) BP: 138/94 (05/26 0420) Pulse Rate: 71 (05/26 0420)  Labs: Recent Labs    06/19/17 0234 06/19/17 1104 06/20/17 0232 06/21/17 0231  HGB 13.3  --  13.3 13.3  HCT 39.8  --  39.4 39.5  PLT 218  --  199 207  LABPROT 13.4  --   --   --   INR 1.03  --   --   --   HEPARINUNFRC 0.29* 0.60 0.79* 0.66  CREATININE 1.01  --   --  1.05    Estimated Creatinine Clearance: 92.4 mL/min (by C-G formula based on SCr of 1.05 mg/dL).  Assessment: 52 yo male with CAD, s/p cath and awaiting CABG on IV heparin. Heparin level therapeutic at 0.66, CBC wnl.   Goal of Therapy:  Heparin level 0.3-0.7 units/ml Monitor platelets by anticoagulation protocol: Yes   Plan:  -Continue heparin 1100 units/hr -Daily heparin level, CBC -Monitor S/Sx bleeding  [place] Highland, PharmD, BCPS PGY-2 Cardiology Pharmacy Resident Pager: [phone] 06/21/2017

## 2017-06-21 NOTE — Progress Notes (Signed)
Pre-op Cardiac Surgery  Carotid Findings:  1-39% ICA stenosis.  Vertebral artery flow is antegrade.   Upper Extremity Right Left  Brachial Pressures 140T 144T  Radial Waveforms T T  Ulnar Waveforms T T  Palmar Arch (Allen's Test) WNL Doppler signal remains normal with radial compression and obliterates with ulnar compression       Lower  Extremity Right Left  Dorsalis Pedis 107M 49M  Anterior Tibial    Posterior Tibial 119M 108M  Great Toe 0.58 0.66  Ankle/Brachial Indices 0.83 0.75    Findings:  Bilateral ABIs indicate a mild reduction in arterial blood flow with abnormal waveforms.  Bilateral TBIs are abnormal.

## 2017-06-21 NOTE — Progress Notes (Signed)
Progress Note  Patient Name: Marcus HEAP Sr. Date of Encounter: 06/21/2017  Primary Cardiologist: Nanetta Batty, MD   Subjective   Continues to feel well -awaiting CABG next week.  Inpatient Medications    Scheduled Meds: . aspirin EC  81 mg Oral Daily  . atorvastatin  80 mg Oral q1800  . Chlorhexidine Gluconate Cloth  6 each Topical Daily  . enalapril  10 mg Oral Daily  . glipiZIDE  5 mg Oral QAC breakfast  . insulin aspart  0-15 Units Subcutaneous TID WC  . insulin glargine  34 Units Subcutaneous QHS  . isosorbide mononitrate  30 mg Oral Daily  . metoprolol tartrate  25 mg Oral BID  . mupirocin ointment  1 application Nasal BID  . sodium chloride flush  3 mL Intravenous Q12H   Continuous Infusions: . sodium chloride Stopped (06/18/17 1500)  . sodium chloride    . sodium chloride Stopped (06/19/17 0131)  . heparin 1,100 Units/hr (06/21/17 0821)   PRN Meds: sodium chloride, acetaminophen, diazepam, nitroGLYCERIN, ondansetron (ZOFRAN) IV, sodium chloride flush   Vital Signs    Vitals:   06/20/17 2327 06/21/17 0400 06/21/17 0420 06/21/17 0745  BP: 122/81  (!) 138/94 134/70  Pulse: (!) 57  71 69  Resp: Temp: 97.8 F (36.6 C)  97.9 F (36.6 C) 97.9 F (36.6 C)  TempSrc: Oral  Oral Oral  SpO2: 97%  97% 98%  Weight:   203 lb 7.8 oz (92.3 kg)   Height:        Intake/Output Summary (Last 24 hours) at 06/21/2017 0927 Last data filed at 06/21/2017 0700 Gross per 24 hour  Intake 693.78 ml  Output 1600 ml  Net -906.22 ml   Filed Weights   06/19/17 0603 06/20/17 0347 06/21/17 0420  Weight: 205 lb 7.5 oz (93.2 kg) 204 lb 8 oz (92.8 kg) 203 lb 7.8 oz (92.3 kg)    Telemetry    Sinus rhythm- Personally Reviewed  ECG    N/A  Physical Exam   General appearance: alert and no distress Neck: no carotid bruit, no JVD and thyroid not enlarged, symmetric, no tenderness/mass/nodules Lungs: clear to auscultation bilaterally Heart: regular rate and  rhythm Abdomen: soft, non-tender; bowel sounds normal; no masses,  no organomegaly Extremities: extremities normal, atraumatic, no cyanosis or edema Pulses: 2+ and symmetric Skin: Skin color, texture, turgor normal. No rashes or lesions Neurologic: Grossly normal Psych: Pleasant  Exam unchanged from previous day (5/25)  Labs    Chemistry Recent Labs  Lab 06/18/17 0648 06/19/17 0234 06/21/17 0231  NA 138 137 140  K 4.3 4.1 3.9  CL 106 106 108  CO2 GLUCOSE 142* 128* 106*  BUN CREATININE 0.97 1.01 1.05  CALCIUM 8.9 8.8* 9.2  PROT  --  6.5  --   ALBUMIN  --  3.6  --   AST  --  39  --   ALT  --  50  --   ALKPHOS  --  79  --   BILITOT  --  0.9  --   GFRNONAA >60 >60 >60  GFRAA >60 >60 >60  ANIONGAP Hematology Recent Labs  Lab 06/19/17 0234 06/20/17 0232 06/21/17 0231  WBC 6.0 6.7 6.7  RBC 4.46 4.37 4.38  HGB 13.3 13.3 13.3  HCT 39.8 39.4 39.5  MCV 89.2 90.2 90.2  MCH 29.8 30.4 30.4  MCHC 33.4 33.8 33.7  RDW 14.7 14.6 14.7  PLT 218 199 207    Cardiac Enzymes Recent Labs  Lab 06/17/17 2056 06/18/17 0244 06/18/17 0648  TROPONINI 0.04* 0.05* 0.03*    Recent Labs  Lab 06/17/17 1053 06/17/17 1417  TROPIPOC 0.05 0.04     BNPNo results for input(s): BNP, PROBNP in the last 168 hours.   DDimer No results for input(s): DDIMER in the last 168 hours.   Radiology    No results found.  Cardiac Studies   Cardiac catheterization (06/18/2017)  Conclusion     Ost 1st Mrg to 1st Mrg lesion is 90% stenosed.  Prox LAD to Mid LAD lesion is 50% stenosed.  Ost 1st Diag lesion is 80% stenosed.  Prox RCA lesion is 30% stenosed.  Mid RCA-1 lesion is 60% stenosed.  Mid RCA-2 lesion is 20% stenosed.  The left ventricular ejection fraction is 50-55% by visual estimate.  LV end diastolic pressure is normal.  The left ventricular systolic function is normal.  Mid LM to Ost LAD lesion is 80% stenosed.  Ost Cx to Prox Cx  lesion is 95% stenosed.  Mid LM lesion is 80% stenosed.   Multivessel CAD with 80% distal left main stenosis with 80% ostial in-stent restenosis of a previously placed LAD stent and 95% ostial circumflex/Ramus stenosis.  The LAD has a stent extending from the ostium to the mid segment and another stent arises within the stented segment to the first diagonal vessel.  There is proximal in-stent restenosis of 80% extending to the ostium and distal stent restenosis of 50%.  There is 80% ostial stenosis in the stent in the diagonal vessel.  Ramus/circumflex has 95% stenosis.  The major vessel is a ramus vessel which extends towards the apex.  The proximal branch is an AV groove circumflex which is small caliber and has 95% stenosis.  There is collateralization of this distal circumflex marginal by the via the PLA branch of the RCA.  The right coronary artery is tortuous and is 30% proximal stenosis, 50 to 60% stenosis and a band of the vessel followed by 20% stenosis.  The PLA collateralizes the distal circumflex marginal branch.  RECOMMENDATION: Patient has been on Plavix.  Plavix will be held for Plavix washout.  Surgical consultation will be obtained.  Will initiate heparin therapy 8 hours following radial sheath removal and aggressive medical therapy until CABG surgery     Patient Profile     52 y.o. male status post remote PCI and stenting back in 2007.  He is a retired and [facility].  He does continue smoking as well as issues with schizophrenia.  Because of accelerated angina he underwent cardiac catheterization yesterday by Dr. Tresa Endo revealing left main disease with disease in the LAD and circumflex as well as ramus branch as well.  This was considered surgical anatomy and Dr. Zenaida Niece trigt was consulted who agreed and has placed him on the surgical schedule for Tuesday, 06/24/2017, after Plavix washout.  He remains on IV heparin and is pain-free.  Assessment & Plan      1: Coronary artery disease- prior history of stenting back in 07 now with accelerated angina and cardiac catheterization revealing left main disease as well as disease in the LAD, ramus branch and circumflex with normal LV function.  Plavix has been on hold on his washing out.  He is remained pain-free on IV heparin.  Will require coronary artery bypass grafting scheduled by  Dr. Zenaida Niece trigt for 06/24/2017.  - plan for CABG Tuesday  - labs stable  2: Essential hypertension- controlled on current medications  3: Hyperlipidemia- on statin therapy  For questions or updates, please contact [facility] Please consult [email or web address] for contact info under Cardiology/STEMI.   Chrystie Nose, MD, [facility], FACP  [facility]  Medical Director of the Advanced Lipid Disorders &  Cardiovascular Risk Reduction Clinic Diplomate of the American Board of Clinical Lipidology Attending Cardiologist  Direct Dial: [phone]  Fax: [phone]  Website:  [email or web address]  Chrystie Nose, MD  06/21/2017, 9:27 AM

## 2017-06-22 DIAGNOSIS — R079 Chest pain, unspecified: Secondary | ICD-10-CM | POA: Diagnosis not present

## 2017-06-22 DIAGNOSIS — E785 Hyperlipidemia, unspecified: Secondary | ICD-10-CM | POA: Diagnosis not present

## 2017-06-22 DIAGNOSIS — I10 Essential (primary) hypertension: Secondary | ICD-10-CM | POA: Diagnosis not present

## 2017-06-22 DIAGNOSIS — I25110 Atherosclerotic heart disease of native coronary artery with unstable angina pectoris: Secondary | ICD-10-CM | POA: Diagnosis not present

## 2017-06-22 LAB — BASIC METABOLIC PANEL
ANION GAP: 9 (ref 5–15)
BUN: 13 mg/dL (ref 6–20)
CO2: 23 mmol/L (ref 22–32)
Calcium: 9 mg/dL (ref 8.9–10.3)
Chloride: 106 mmol/L (ref 101–111)
Creatinine, Ser: 1.02 mg/dL (ref 0.61–1.24)
GFR calc Af Amer: >60 mL/min (ref >60)
GFR calc non Af Amer: >60 mL/min (ref >60)
GLUCOSE: 148 mg/dL — AB (ref 65–99)
POTASSIUM: 4 mmol/L (ref 3.5–5.1)
Sodium: 138 mmol/L (ref 135–145)

## 2017-06-22 LAB — GLUCOSE, CAPILLARY
GLUCOSE-CAPILLARY: 96 mg/dL (ref 65–99)
GLUCOSE-CAPILLARY: 192 mg/dL — AB (ref 65–99)
Glucose-Capillary: 347 mg/dL — ABNORMAL HIGH (ref 65–99)
Glucose-Capillary: 176 mg/dL — ABNORMAL HIGH (ref 65–99)

## 2017-06-22 LAB — PREPARE RBC (CROSSMATCH)

## 2017-06-22 LAB — CBC
HCT: 39.4 % (ref 39.0–52.0)
Hemoglobin: 13.3 g/dL (ref 13.0–17.0)
MCH: 30.4 pg (ref 26.0–34.0)
MCHC: 33.8 g/dL (ref 30.0–36.0)
MCV: 90.2 fL (ref 78.0–100.0)
PLATELETS: 210 10*3/uL (ref 150–400)
RBC: 4.37 MIL/uL (ref 4.22–5.81)
RDW: 14.6 % (ref 11.5–15.5)
WBC: 6.6 10*3/uL (ref 4.0–10.5)

## 2017-06-22 LAB — BLOOD GAS, ARTERIAL
Acid-base deficit: 2 mmol/L (ref 0.0–2.0)
Bicarbonate: 22.3 mmol/L (ref 20.0–28.0)
Drawn by: 519031
FIO2: 21
O2 Saturation: 96.6 %
Patient temperature: 97.7
pCO2 arterial: 37.4 mmHg (ref 32.0–48.0)
pH, Arterial: 7.391 (ref 7.350–7.450)
pO2, Arterial: 88.4 mmHg (ref 83.0–108.0)

## 2017-06-22 LAB — PLATELET INHIBITION P2Y12: Platelet Function  P2Y12: 190 [PRU] — ABNORMAL LOW (ref 194–418)

## 2017-06-22 LAB — ABO/RH: ABO/RH(D): A POS

## 2017-06-22 LAB — HEPARIN LEVEL (UNFRACTIONATED): Heparin Unfractionated: 0.63 IU/mL (ref 0.30–0.70)

## 2017-06-22 MED ORDER — CHLORHEXIDINE GLUCONATE 4 % EX LIQD
60.0000 mL | Freq: Once | CUTANEOUS | Status: AC
  Administered 2017-06-22: 4 via TOPICAL
  Filled 2017-06-22: qty 60

## 2017-06-22 MED ORDER — CHLORHEXIDINE GLUCONATE 0.12 % MT SOLN
15.0000 mL | Freq: Once | OROMUCOSAL | Status: AC
  Administered 2017-06-23: 15 mL via OROMUCOSAL
  Filled 2017-06-22: qty 15

## 2017-06-22 MED ORDER — DIAZEPAM 5 MG PO TABS
5.0000 mg | ORAL_TABLET | Freq: Once | ORAL | Status: AC
  Administered 2017-06-23: 5 mg via ORAL
  Filled 2017-06-22: qty 1

## 2017-06-22 MED ORDER — METOPROLOL TARTRATE 12.5 MG HALF TABLET: 12.5000 mg | ORAL_TABLET | Freq: Once | ORAL | Status: DC

## 2017-06-22 MED ORDER — CHLORHEXIDINE GLUCONATE 4 % EX LIQD
60.0000 mL | Freq: Once | CUTANEOUS | Status: AC
  Administered 2017-06-23: 4 via TOPICAL
  Filled 2017-06-22: qty 60

## 2017-06-22 MED ORDER — TEMAZEPAM 15 MG PO CAPS: 15.0000 mg | ORAL_CAPSULE | Freq: Once | ORAL | Status: DC | PRN

## 2017-06-22 MED ORDER — BISACODYL 5 MG PO TBEC
5.0000 mg | DELAYED_RELEASE_TABLET | Freq: Once | ORAL | Status: AC
  Administered 2017-06-22: 5 mg via ORAL
  Filled 2017-06-22: qty 1

## 2017-06-22 NOTE — Progress Notes (Signed)
Progress Note  Patient Name: Marcus AL Sr. Date of Encounter: 06/22/2017  Primary Cardiologist: Nanetta Batty, MD   Subjective   Awaiting CABG next week.  No chest pain on IV heparin.  Progression of disease by cardiac catheterization.  Inpatient Medications    Scheduled Meds: . aspirin EC  81 mg Oral Daily  . atorvastatin  80 mg Oral q1800  . Chlorhexidine Gluconate Cloth  6 each Topical Daily  . enalapril  10 mg Oral Daily  . glipiZIDE  5 mg Oral QAC breakfast  . [START ON 06/23/2017] heparin-papaverine-plasmalyte irrigation   Irrigation To OR  . insulin aspart  0-15 Units Subcutaneous TID WC  . insulin glargine  34 Units Subcutaneous QHS  . isosorbide mononitrate  30 mg Oral Daily  . [START ON 06/23/2017] magnesium sulfate  40 mEq Other To OR  . metoprolol tartrate  25 mg Oral BID  . mupirocin ointment  1 application Nasal BID  . [START ON 06/23/2017] potassium chloride  80 mEq Other To OR  . sodium chloride flush  3 mL Intravenous Q12H  . [START ON 06/23/2017] tranexamic acid  15 mg/kg Intravenous To OR  . [START ON 06/23/2017] tranexamic acid  2 mg/kg Intracatheter To OR   Continuous Infusions: . sodium chloride Stopped (06/18/17 1500)  . sodium chloride    . sodium chloride Stopped (06/19/17 0131)  . [START ON 06/23/2017] cefUROXime (ZINACEF)  IV    . [START ON 06/23/2017] cefUROXime (ZINACEF)  IV    . [START ON 06/23/2017] dexmedetomidine    . [START ON 06/23/2017] DOPamine    . [START ON 06/23/2017] epinephrine    . [START ON 06/23/2017] heparin 30,000 units/NS 1000 mL solution for CELLSAVER    . heparin 1,100 Units/hr (06/22/17 0600)  . [START ON 06/23/2017] insulin (NOVOLIN-R) infusion    . [START ON 06/23/2017] nitroGLYCERIN    . [START ON 06/23/2017] phenylephrine /255mL NS (0.08mg /ml) infusion    . [START ON 06/23/2017] tranexamic acid (CYKLOKAPRON) infusion ([facility])    . [START ON 06/23/2017] vancomycin     PRN Meds: sodium chloride, acetaminophen, diazepam,  nitroGLYCERIN, ondansetron (ZOFRAN) IV, sodium chloride flush   Vital Signs    Vitals:   06/21/17 2025 06/21/17 2329 06/22/17 0344 06/22/17 0740  BP: 110/64 113/70 114/75 115/72  Pulse: 68   (!) 56  Resp: Temp: 98.3 F (36.8 C) 98.1 F (36.7 C) 98 F (36.7 C) 98.1 F (36.7 C)  TempSrc: Oral Oral Oral Oral  SpO2: 99%   100%  Weight:   204 lb 5.9 oz (92.7 kg)   Height:        Intake/Output Summary (Last 24 hours) at 06/22/2017 0818 Last data filed at 06/22/2017 0600 Gross per 24 hour  Intake 1486 ml  Output 900 ml  Net 586 ml   Filed Weights   06/20/17 0347 06/21/17 0420 06/22/17 0344  Weight: 204 lb 8 oz (92.8 kg) 203 lb 7.8 oz (92.3 kg) 204 lb 5.9 oz (92.7 kg)    Telemetry    Sinus rhythm- Personally Reviewed  ECG    Not performed today- Personally Reviewed  Physical Exam   GEN: No acute distress.   Neck: No JVD Cardiac: RRR, no murmurs, rubs, or gallops.  Respiratory: Clear to auscultation bilaterally. GI: Soft, nontender, non-distended  MS: No edema; No deformity. Neuro:  Nonfocal  Psych: Normal affect   Labs    Chemistry Recent Labs  Lab 06/19/17 0234 06/21/17 0231  06/22/17 0232  NA 137 140 138  K 4.1 3.9 4.0  CL 106 108 106  CO2 GLUCOSE 128* 106* 148*  BUN CREATININE 1.01 1.05 1.02  CALCIUM 8.8* 9.2 9.0  PROT 6.5  --   --   ALBUMIN 3.6  --   --   AST 39  --   --   ALT 50  --   --   ALKPHOS 79  --   --   BILITOT 0.9  --   --   GFRNONAA >60 >60 >60  GFRAA >60 >60 >60  ANIONGAP Hematology Recent Labs  Lab 06/20/17 0232 06/21/17 0231 06/22/17 0232  WBC 6.7 6.7 6.6  RBC 4.37 4.38 4.37  HGB 13.3 13.3 13.3  HCT 39.4 39.5 39.4  MCV 90.2 90.2 90.2  MCH 30.4 30.4 30.4  MCHC 33.8 33.7 33.8  RDW 14.6 14.7 14.6  PLT 199 207 210    Cardiac Enzymes Recent Labs  Lab 06/17/17 2056 06/18/17 0244 06/18/17 0648  TROPONINI 0.04* 0.05* 0.03*    Recent Labs  Lab 06/17/17 1053 06/17/17 1417    TROPIPOC 0.05 0.04     BNPNo results for input(s): BNP, PROBNP in the last 168 hours.   DDimer No results for input(s): DDIMER in the last 168 hours.   Radiology    No results found.  Cardiac Studies   2D echocardiogram (06/19/2017)   Study Conclusions  - Left ventricle: The cavity size was normal. Wall thickness was   increased in a pattern of moderate LVH. Systolic function was   normal. The estimated ejection fraction was in the range of 50%   to 55%. Although no diagnostic regional wall motion abnormality   was identified, this possibility cannot be completely excluded on   the basis of this study. Doppler parameters are consistent with   abnormal left ventricular relaxation (grade 1 diastolic   dysfunction). The E/e&' ratio is between 8-15, suggesting   indeterminate LV filling pressure. - Aortic valve: Trileaflet. Sclerosis without stenosis. There was   mild regurgitation. - Mitral valve: Mildly thickened leaflets . There was trivial   regurgitation. - Left atrium: The atrium was normal in size. - Inferior vena cava: The vessel was normal in size. The   respirophasic diameter changes were in the normal range (>= 50%),   consistent with normal central venous pressure.  Impressions:  - LVEF 50-55%, moderate LVH, grade 1 DD, indetermiante LV filling   pressure, aortic valve sclerosis with mild AI, trivial MR, normal   LA size, normal IVC     LEFT HEART CATH AND CORONARY ANGIOGRAPHY  Conclusion     Ost 1st Mrg to 1st Mrg lesion is 90% stenosed.  Prox LAD to Mid LAD lesion is 50% stenosed.  Ost 1st Diag lesion is 80% stenosed.  Prox RCA lesion is 30% stenosed.  Mid RCA-1 lesion is 60% stenosed.  Mid RCA-2 lesion is 20% stenosed.  The left ventricular ejection fraction is 50-55% by visual estimate.  LV end diastolic pressure is normal.  The left ventricular systolic function is normal.  Mid LM to Ost LAD lesion is 80% stenosed.  Ost Cx to Prox  Cx lesion is 95% stenosed.  Mid LM lesion is 80% stenosed.   Multivessel CAD with 80% distal left main stenosis with 80% ostial in-stent restenosis of a previously placed LAD stent and 95% ostial circumflex/Ramus stenosis.  The LAD has  a stent extending from the ostium to the mid segment and another stent arises within the stented segment to the first diagonal vessel.  There is proximal in-stent restenosis of 80% extending to the ostium and distal stent restenosis of 50%.  There is 80% ostial stenosis in the stent in the diagonal vessel.  Ramus/circumflex has 95% stenosis.  The major vessel is a ramus vessel which extends towards the apex.  The proximal branch is an AV groove circumflex which is small caliber and has 95% stenosis.  There is collateralization of this distal circumflex marginal by the via the PLA branch of the RCA.  The right coronary artery is tortuous and is 30% proximal stenosis, 50 to 60% stenosis and a band of the vessel followed by 20% stenosis.  The PLA collateralizes the distal circumflex marginal branch.  RECOMMENDATION: Patient has been on Plavix.  Plavix will be held for Plavix washout.  Surgical consultation will be obtained.  Will initiate heparin therapy 8 hours following radial sheath removal and aggressive medical therapy until CABG surgery.     Patient Profile     52 y.o. male status post remote PCI and stenting back in 2007.  He is a retired and [facility]  He does continue smoking as well as issues with schizophrenia.  Because of accelerated angina he underwent cardiac catheterization yesterday by Dr. Tresa Endo revealing left main disease with disease in the LAD and circumflex as well as ramus branch as well.  This was considered surgical anatomy and Dr. Zenaida Niece trigt was consulted who agreed and has placed him on the surgical schedule for Tuesday, 06/24/2017, after Plavix washout.  He remains on IV heparin and is pain-free    Assessment  & Plan    1: Coronary artery disease- history of stenting back in 07 admitted with accelerated angina and cardiac catheterization that revealed significant progression of disease with left main involvement.  Patient was deemed a coronary artery bypass graft candidate.  He is on IV heparin and pain-free.  He is awaiting bypass surgery tomorrow having stayed in the hospital for "Plavix washout".  2: Essential hypertension- controlled on current medications  3: Hyperlipidemia-on statin therapy.  For questions or updates, please contact [facility] Please consult [email or web address] for contact info under Cardiology/STEMI.      Signed, Nanetta Batty, MD  06/22/2017, 8:18 AM

## 2017-06-22 NOTE — Progress Notes (Signed)
ANTICOAGULATION CONSULT NOTE Pharmacy Consult for heparin Indication: chest pain/ACS  No Known Allergies  Patient Measurements: Height: 6' (182.9 cm) Weight: 204 lb 5.9 oz (92.7 kg) IBW/kg (Calculated) : 77.6 Heparin Dosing Weight: 91.9 kg  Vital Signs: Temp: 98 F (36.7 C) (05/27 0344) Temp Source: Oral (05/27 0344) BP: 114/75 (05/27 0344) Pulse Rate: 68 (05/26 2025)  Labs: Recent Labs    06/20/17 0232 06/21/17 0231 06/22/17 0232  HGB 13.3 13.3 13.3  HCT 39.4 39.5 39.4  PLT 199 207 210  HEPARINUNFRC 0.79* 0.66 0.63  CREATININE  --  1.05 1.02    Estimated Creatinine Clearance: 95.1 mL/min (by C-G formula based on SCr of 1.02 mg/dL).  Assessment: 52 yo male with CAD, s/p cath and awaiting CABG on IV heparin. Heparin level therapeutic at 0.63, CBC wnl. OR planned tomorrow morning.  Goal of Therapy:  Heparin level 0.3-0.7 units/ml Monitor platelets by anticoagulation protocol: Yes   Plan:  -Continue heparin 1100 units/hr -Daily heparin level, CBC -Monitor S/Sx bleeding  Fredonia Highland, PharmD, BCPS PGY-2 Cardiology Pharmacy Resident Pager: [phone] 06/22/2017

## 2017-06-22 NOTE — Progress Notes (Addendum)
4 Days Post-Op Procedure(s) (LRB): LEFT HEART CATH AND CORONARY ANGIOGRAPHY (N/A) Subjective: Severe left main and multivessel CAD with non-STEMI Coronary angiogram and echocardiogram images reviewed and counseled with patient.  LV function fairly well preserved with mild AI.  Normal sinus rhythm.  Plan multivessel CABG in a.m.  Procedure benefits and risks discussed with patient.  Patient has been on chronic Plavix for previous PCI. Despite Plavix being held platelet function remains moderately depressed Continue to hold Plavix and check von Willebrand's panel We will proceed with CABG in a.m. and be prepared to administer platelets if needed Objective: Vital signs in last 24 hours: Temp:  [97.7 F (36.5 C)-98.3 F (36.8 C)] 98 F (36.7 C) (05/27 0344) Pulse Rate:  [62-69] 68 (05/26 2025) Cardiac Rhythm: Normal sinus rhythm (05/27 0334) Resp:  [13-16] 15 (05/27 0344) BP: (110-153)/(64-82) 114/75 (05/27 0344) SpO2:  [98 %-100 %] 99 % (05/26 2025) Weight:  [204 lb 5.9 oz (92.7 kg)] 204 lb 5.9 oz (92.7 kg) (05/27 0344)  Hemodynamic parameters for last 24 hours:  Stable  Intake/Output from previous day: 05/26 0701 - 05/27 0700 In: 1366 [P.O.:1080; I.V.:286] Out: 900 [Urine:900] Intake/Output this shift: No intake/output data recorded.       Exam    General- alert and comfortable    Neck- no JVD, no cervical adenopathy palpable, no carotid bruit   Lungs- clear without rales, wheezes   Cor- regular rate and rhythm, no murmur , gallop   Abdomen- soft, non-tender   Extremities - warm, non-tender, minimal edema   Neuro- oriented, appropriate, no focal weakness   Lab Results: Recent Labs    06/21/17 0231 06/22/17 0232  WBC 6.7 6.6  HGB 13.3 13.3  HCT 39.5 39.4  PLT 207 210   BMET:  Recent Labs    06/21/17 0231 06/22/17 0232  NA 140 138  K 3.9 4.0  CL 108 106  CO2 24 23  GLUCOSE 106* 148*  BUN 12 13  CREATININE 1.05 1.02  CALCIUM 9.2 9.0    PT/INR: No  results for input(s): LABPROT, INR in the last 72 hours. ABG No results found for: PHART, HCO3, TCO2, ACIDBASEDEF, O2SAT CBG (last 3)  Recent Labs    06/21/17 1255 06/21/17 1833 06/21/17 2119  GLUCAP 201* 164* 256*    Assessment/Plan: S/P Procedure(s) (LRB): LEFT HEART CATH AND CORONARY ANGIOGRAPHY (N/A) CABG x3  in a.m., May 28   LOS: 5 days    Kathlee Nations Trigt III 06/22/2017

## 2017-06-23 ENCOUNTER — Encounter (HOSPITAL_COMMUNITY): Payer: Self-pay | Admitting: Certified Registered Nurse Anesthetist

## 2017-06-23 ENCOUNTER — Encounter (HOSPITAL_COMMUNITY)
Admission: EM | Disposition: A | Discharge status: Skilled Nursing Facility | Payer: Self-pay | Source: Home / Self Care | Attending: Cardiothoracic Surgery

## 2017-06-23 ENCOUNTER — Inpatient Hospital Stay (HOSPITAL_COMMUNITY): Payer: Self-pay | Admitting: Anesthesiology

## 2017-06-23 ENCOUNTER — Inpatient Hospital Stay (HOSPITAL_COMMUNITY): Payer: Self-pay

## 2017-06-23 DIAGNOSIS — I25110 Atherosclerotic heart disease of native coronary artery with unstable angina pectoris: Secondary | ICD-10-CM

## 2017-06-23 DIAGNOSIS — Z951 Presence of aortocoronary bypass graft: Secondary | ICD-10-CM

## 2017-06-23 HISTORY — PX: TEE WITHOUT CARDIOVERSION: SHX5443

## 2017-06-23 HISTORY — PX: CORONARY ARTERY BYPASS GRAFT: SHX141

## 2017-06-23 LAB — POCT I-STAT 3, ART BLOOD GAS (G3+)
ACID-BASE DEFICIT: 1 mmol/L (ref 0.0–2.0)
ACID-BASE DEFICIT: 5 mmol/L — AB (ref 0.0–2.0)
Acid-base deficit: 1 mmol/L (ref 0.0–2.0)
Acid-base deficit: 6 mmol/L — ABNORMAL HIGH (ref 0.0–2.0)
Acid-base deficit: 5 mmol/L — ABNORMAL HIGH (ref 0.0–2.0)
Bicarbonate: 24.1 mmol/L (ref 20.0–28.0)
Bicarbonate: 24.1 mmol/L (ref 20.0–28.0)
Bicarbonate: 19.2 mmol/L — ABNORMAL LOW (ref 20.0–28.0)
Bicarbonate: 19.4 mmol/L — ABNORMAL LOW (ref 20.0–28.0)
Bicarbonate: 19 mmol/L — ABNORMAL LOW (ref 20.0–28.0)
O2 SAT: 100 %
O2 Saturation: 100 %
O2 Saturation: 97 %
O2 Saturation: 99 %
O2 Saturation: 98 %
PCO2 ART: 39.8 mmHg (ref 32.0–48.0)
PH ART: 7.392 (ref 7.350–7.450)
PH ART: 7.368 (ref 7.350–7.450)
PO2 ART: 109 mmHg — AB (ref 83.0–108.0)
Patient temperature: 37.7
TCO2: 25 mmol/L (ref 22–32)
TCO2: 25 mmol/L (ref 22–32)
TCO2: 20 mmol/L — AB (ref 22–32)
TCO2: 20 mmol/L — AB (ref 22–32)
TCO2: 20 mmol/L — ABNORMAL LOW (ref 22–32)
pCO2 arterial: 43.8 mmHg (ref 32.0–48.0)
pCO2 arterial: 32.9 mmHg (ref 32.0–48.0)
pCO2 arterial: 33.8 mmHg (ref 32.0–48.0)
pCO2 arterial: 33.8 mmHg (ref 32.0–48.0)
pH, Arterial: 7.349 — ABNORMAL LOW (ref 7.350–7.450)
pH, Arterial: 7.366 (ref 7.350–7.450)
pH, Arterial: 7.361 (ref 7.350–7.450)
pO2, Arterial: 302 mmHg — ABNORMAL HIGH (ref 83.0–108.0)
pO2, Arterial: 361 mmHg — ABNORMAL HIGH (ref 83.0–108.0)
pO2, Arterial: 85 mmHg (ref 83.0–108.0)
pO2, Arterial: 165 mmHg — ABNORMAL HIGH (ref 83.0–108.0)

## 2017-06-23 LAB — POCT I-STAT, CHEM 8
BUN: 12 mg/dL (ref 6–20)
BUN: 10 mg/dL (ref 6–20)
BUN: 11 mg/dL (ref 6–20)
BUN: 10 mg/dL (ref 6–20)
BUN: 10 mg/dL (ref 6–20)
BUN: 9 mg/dL (ref 6–20)
BUN: 11 mg/dL (ref 6–20)
CALCIUM ION: 1.19 mmol/L (ref 1.15–1.40)
CALCIUM ION: 1.04 mmol/L — AB (ref 1.15–1.40)
CALCIUM ION: 1.04 mmol/L — AB (ref 1.15–1.40)
CALCIUM ION: 1.14 mmol/L — AB (ref 1.15–1.40)
CHLORIDE: 105 mmol/L (ref 101–111)
CHLORIDE: 101 mmol/L (ref 101–111)
CHLORIDE: 107 mmol/L (ref 101–111)
CREATININE: 0.7 mg/dL (ref 0.61–1.24)
CREATININE: 0.7 mg/dL (ref 0.61–1.24)
CREATININE: 0.7 mg/dL (ref 0.61–1.24)
Calcium, Ion: 1.05 mmol/L — ABNORMAL LOW (ref 1.15–1.40)
Calcium, Ion: 1.18 mmol/L (ref 1.15–1.40)
Calcium, Ion: 1.06 mmol/L — ABNORMAL LOW (ref 1.15–1.40)
Chloride: 100 mmol/L — ABNORMAL LOW (ref 101–111)
Chloride: 105 mmol/L (ref 101–111)
Chloride: 100 mmol/L — ABNORMAL LOW (ref 101–111)
Chloride: 101 mmol/L (ref 101–111)
Creatinine, Ser: 0.8 mg/dL (ref 0.61–1.24)
Creatinine, Ser: 0.7 mg/dL (ref 0.61–1.24)
Creatinine, Ser: 0.7 mg/dL (ref 0.61–1.24)
Creatinine, Ser: 0.9 mg/dL (ref 0.61–1.24)
GLUCOSE: 139 mg/dL — AB (ref 65–99)
GLUCOSE: 166 mg/dL — AB (ref 65–99)
GLUCOSE: 155 mg/dL — AB (ref 65–99)
GLUCOSE: 133 mg/dL — AB (ref 65–99)
Glucose, Bld: 113 mg/dL — ABNORMAL HIGH (ref 65–99)
Glucose, Bld: 129 mg/dL — ABNORMAL HIGH (ref 65–99)
Glucose, Bld: 166 mg/dL — ABNORMAL HIGH (ref 65–99)
HCT: 39 % (ref 39.0–52.0)
HCT: 35 % — ABNORMAL LOW (ref 39.0–52.0)
HCT: 28 % — ABNORMAL LOW (ref 39.0–52.0)
HCT: 32 % — ABNORMAL LOW (ref 39.0–52.0)
HEMATOCRIT: 26 % — AB (ref 39.0–52.0)
HEMATOCRIT: 31 % — AB (ref 39.0–52.0)
HEMATOCRIT: 32 % — AB (ref 39.0–52.0)
HEMOGLOBIN: 8.8 g/dL — AB (ref 13.0–17.0)
HEMOGLOBIN: 10.5 g/dL — AB (ref 13.0–17.0)
HEMOGLOBIN: 10.9 g/dL — AB (ref 13.0–17.0)
HEMOGLOBIN: 9.5 g/dL — AB (ref 13.0–17.0)
HEMOGLOBIN: 10.9 g/dL — AB (ref 13.0–17.0)
Hemoglobin: 13.3 g/dL (ref 13.0–17.0)
Hemoglobin: 11.9 g/dL — ABNORMAL LOW (ref 13.0–17.0)
POTASSIUM: 4.2 mmol/L (ref 3.5–5.1)
POTASSIUM: 4.3 mmol/L (ref 3.5–5.1)
Potassium: 4.1 mmol/L (ref 3.5–5.1)
Potassium: 3.5 mmol/L (ref 3.5–5.1)
Potassium: 3.7 mmol/L (ref 3.5–5.1)
Potassium: 4 mmol/L (ref 3.5–5.1)
Potassium: 4.6 mmol/L (ref 3.5–5.1)
SODIUM: 140 mmol/L (ref 135–145)
SODIUM: 138 mmol/L (ref 135–145)
SODIUM: 138 mmol/L (ref 135–145)
Sodium: 138 mmol/L (ref 135–145)
Sodium: 138 mmol/L (ref 135–145)
Sodium: 136 mmol/L (ref 135–145)
Sodium: 138 mmol/L (ref 135–145)
TCO2: 26 mmol/L (ref 22–32)
TCO2: 24 mmol/L (ref 22–32)
TCO2: 26 mmol/L (ref 22–32)
TCO2: 23 mmol/L (ref 22–32)
TCO2: 23 mmol/L (ref 22–32)
TCO2: 25 mmol/L (ref 22–32)
TCO2: 22 mmol/L (ref 22–32)

## 2017-06-23 LAB — ECHO TEE
Annulus: 2.3 cm
Ao-desc: 1.9 cm
Ao-prox: 2.9
IVS: 0.98 cm (ref 0.6–1.1)
LV PW d: 1 mm (ref 0.6–1.1)
LVIDD: 5.4 cm
STJ: 2.5 cm
Sinus: 2.8 cm

## 2017-06-23 LAB — GLUCOSE, CAPILLARY
GLUCOSE-CAPILLARY: 117 mg/dL — AB (ref 65–99)
GLUCOSE-CAPILLARY: 136 mg/dL — AB (ref 65–99)
GLUCOSE-CAPILLARY: 129 mg/dL — AB (ref 65–99)
Glucose-Capillary: 140 mg/dL — ABNORMAL HIGH (ref 65–99)
Glucose-Capillary: 127 mg/dL — ABNORMAL HIGH (ref 65–99)
Glucose-Capillary: 135 mg/dL — ABNORMAL HIGH (ref 65–99)
Glucose-Capillary: 171 mg/dL — ABNORMAL HIGH (ref 65–99)
Glucose-Capillary: 151 mg/dL — ABNORMAL HIGH (ref 65–99)

## 2017-06-23 LAB — HEMOGLOBIN AND HEMATOCRIT, BLOOD
HCT: 30 % — ABNORMAL LOW (ref 39.0–52.0)
Hemoglobin: 10.3 g/dL — ABNORMAL LOW (ref 13.0–17.0)

## 2017-06-23 LAB — POCT I-STAT 4, (NA,K, GLUC, HGB,HCT)
Glucose, Bld: 147 mg/dL — ABNORMAL HIGH (ref 65–99)
HEMATOCRIT: 32 % — AB (ref 39.0–52.0)
Hemoglobin: 10.9 g/dL — ABNORMAL LOW (ref 13.0–17.0)
Potassium: 4.1 mmol/L (ref 3.5–5.1)
SODIUM: 140 mmol/L (ref 135–145)

## 2017-06-23 LAB — PROTIME-INR
INR: 1.27
Prothrombin Time: 15.8 seconds — ABNORMAL HIGH (ref 11.4–15.2)

## 2017-06-23 LAB — CBC
HCT: 31.4 % — ABNORMAL LOW (ref 39.0–52.0)
HCT: 34.7 % — ABNORMAL LOW (ref 39.0–52.0)
HEMATOCRIT: 40.6 % (ref 39.0–52.0)
HEMOGLOBIN: 13.7 g/dL (ref 13.0–17.0)
Hemoglobin: 10.8 g/dL — ABNORMAL LOW (ref 13.0–17.0)
Hemoglobin: 11.8 g/dL — ABNORMAL LOW (ref 13.0–17.0)
MCH: 30.1 pg (ref 26.0–34.0)
MCH: 30.5 pg (ref 26.0–34.0)
MCH: 29.9 pg (ref 26.0–34.0)
MCHC: 33.7 g/dL (ref 30.0–36.0)
MCHC: 34.4 g/dL (ref 30.0–36.0)
MCHC: 34 g/dL (ref 30.0–36.0)
MCV: 89.2 fL (ref 78.0–100.0)
MCV: 88.7 fL (ref 78.0–100.0)
MCV: 88.1 fL (ref 78.0–100.0)
PLATELETS: 120 10*3/uL — AB (ref 150–400)
Platelets: 225 10*3/uL (ref 150–400)
Platelets: 153 10*3/uL (ref 150–400)
RBC: 4.55 MIL/uL (ref 4.22–5.81)
RBC: 3.54 MIL/uL — ABNORMAL LOW (ref 4.22–5.81)
RBC: 3.94 MIL/uL — ABNORMAL LOW (ref 4.22–5.81)
RDW: 14.5 % (ref 11.5–15.5)
RDW: 14.2 % (ref 11.5–15.5)
RDW: 14.3 % (ref 11.5–15.5)
WBC: 6.3 10*3/uL (ref 4.0–10.5)
WBC: 9.8 10*3/uL (ref 4.0–10.5)
WBC: 9.2 10*3/uL (ref 4.0–10.5)

## 2017-06-23 LAB — BASIC METABOLIC PANEL
Anion gap: 8 (ref 5–15)
BUN: 13 mg/dL (ref 6–20)
CO2: 22 mmol/L (ref 22–32)
CREATININE: 0.99 mg/dL (ref 0.61–1.24)
Calcium: 8.9 mg/dL (ref 8.9–10.3)
Chloride: 108 mmol/L (ref 101–111)
GFR calc non Af Amer: >60 mL/min (ref >60)
Glucose, Bld: 118 mg/dL — ABNORMAL HIGH (ref 65–99)
Potassium: 3.9 mmol/L (ref 3.5–5.1)
Sodium: 138 mmol/L (ref 135–145)

## 2017-06-23 LAB — PLATELET COUNT: Platelets: 131 10*3/uL — ABNORMAL LOW (ref 150–400)

## 2017-06-23 LAB — CREATININE, SERUM
Creatinine, Ser: 1.04 mg/dL (ref 0.61–1.24)
GFR calc Af Amer: >60 mL/min (ref >60)
GFR calc non Af Amer: >60 mL/min (ref >60)

## 2017-06-23 LAB — APTT
APTT: 27 s (ref 24–36)
aPTT: 92 seconds — ABNORMAL HIGH (ref 24–36)

## 2017-06-23 LAB — MAGNESIUM: Magnesium: 2.8 mg/dL — ABNORMAL HIGH (ref 1.7–2.4)

## 2017-06-23 SURGERY — CORONARY ARTERY BYPASS GRAFTING (CABG)
Anesthesia: General | Site: Chest

## 2017-06-23 MED ORDER — ROCURONIUM BROMIDE 10 MG/ML (PF) SYRINGE
PREFILLED_SYRINGE | INTRAVENOUS | Status: AC
  Filled 2017-06-23: qty 15

## 2017-06-23 MED ORDER — METOPROLOL TARTRATE 25 MG/10 ML ORAL SUSPENSION: 12.5000 mg | Freq: Two times a day (BID) | ORAL | Status: DC

## 2017-06-23 MED ORDER — MORPHINE SULFATE (PF) 2 MG/ML IV SOLN
2.0000 mg | INTRAVENOUS | Status: DC | PRN
  Administered 2017-06-24: 4 mg via INTRAVENOUS
  Administered 2017-06-24 (×2): 2 mg via INTRAVENOUS
  Administered 2017-06-25: 4 mg via INTRAVENOUS
  Filled 2017-06-23: qty 2
  Filled 2017-06-23: qty 1
  Filled 2017-06-23: qty 2
  Filled 2017-06-23: qty 1

## 2017-06-23 MED ORDER — LACTATED RINGERS IV SOLN
INTRAVENOUS | Status: DC | PRN
  Administered 2017-06-23: 07:00:00 via INTRAVENOUS

## 2017-06-23 MED ORDER — FENTANYL CITRATE (PF) 250 MCG/5ML IJ SOLN
INTRAMUSCULAR | Status: DC | PRN
  Administered 2017-06-23 (×3): 150 ug via INTRAVENOUS
  Administered 2017-06-23: 50 ug via INTRAVENOUS
  Administered 2017-06-23: 500 ug via INTRAVENOUS
  Administered 2017-06-23 (×2): 150 ug via INTRAVENOUS
  Administered 2017-06-23: 100 ug via INTRAVENOUS
  Administered 2017-06-23: 250 ug via INTRAVENOUS
  Administered 2017-06-23: 100 ug via INTRAVENOUS

## 2017-06-23 MED ORDER — MILRINONE LACTATE IN DEXTROSE 20-5 MG/100ML-% IV SOLN
0.1250 ug/kg/min | INTRAVENOUS | Status: AC
  Administered 2017-06-23: 0.25 ug/kg/min via INTRAVENOUS
  Filled 2017-06-23: qty 100

## 2017-06-23 MED ORDER — DOCUSATE SODIUM 100 MG PO CAPS
200.0000 mg | ORAL_CAPSULE | Freq: Every day | ORAL | Status: DC
  Administered 2017-06-24 – 2017-06-29 (×3): 200 mg via ORAL
  Filled 2017-06-23 (×8): qty 2

## 2017-06-23 MED ORDER — METOPROLOL TARTRATE 5 MG/5ML IV SOLN: 2.5000 mg | INTRAVENOUS | Status: DC | PRN

## 2017-06-23 MED ORDER — PROTAMINE SULFATE 10 MG/ML IV SOLN
INTRAVENOUS | Status: DC | PRN
  Administered 2017-06-23: 280 mg via INTRAVENOUS

## 2017-06-23 MED ORDER — MIDAZOLAM HCL 2 MG/2ML IJ SOLN: 2.0000 mg | INTRAMUSCULAR | Status: DC | PRN

## 2017-06-23 MED ORDER — FENTANYL CITRATE (PF) 250 MCG/5ML IJ SOLN
INTRAMUSCULAR | Status: AC
  Filled 2017-06-23: qty 5

## 2017-06-23 MED ORDER — ASPIRIN 81 MG PO CHEW
324.0000 mg | CHEWABLE_TABLET | Freq: Every day | ORAL | Status: DC
  Filled 2017-06-23: qty 4

## 2017-06-23 MED ORDER — INSULIN REGULAR BOLUS VIA INFUSION
0.0000 [IU] | Freq: Three times a day (TID) | INTRAVENOUS | Status: DC
  Filled 2017-06-23: qty 10

## 2017-06-23 MED ORDER — ALBUMIN HUMAN 5 % IV SOLN
250.0000 mL | INTRAVENOUS | Status: AC | PRN
  Administered 2017-06-23: 250 mL via INTRAVENOUS

## 2017-06-23 MED ORDER — MOMETASONE FURO-FORMOTEROL FUM 200-5 MCG/ACT IN AERO
2.0000 | INHALATION_SPRAY | Freq: Two times a day (BID) | RESPIRATORY_TRACT | Status: DC
  Administered 2017-06-24 – 2017-07-03 (×19): 2 via RESPIRATORY_TRACT
  Filled 2017-06-23 (×2): qty 8.8

## 2017-06-23 MED ORDER — BISACODYL 5 MG PO TBEC
10.0000 mg | DELAYED_RELEASE_TABLET | Freq: Every day | ORAL | Status: DC
  Administered 2017-06-24 – 2017-06-25 (×2): 10 mg via ORAL
  Filled 2017-06-23 (×8): qty 2

## 2017-06-23 MED ORDER — NITROGLYCERIN IN D5W 200-5 MCG/ML-% IV SOLN: 0.0000 ug/min | INTRAVENOUS | Status: DC

## 2017-06-23 MED ORDER — CHLORHEXIDINE GLUCONATE 0.12 % MT SOLN
15.0000 mL | OROMUCOSAL | Status: AC
  Administered 2017-06-23: 15 mL via OROMUCOSAL

## 2017-06-23 MED ORDER — SODIUM CHLORIDE 0.9 % IV SOLN: INTRAVENOUS | Status: DC

## 2017-06-23 MED ORDER — OXYCODONE HCL 5 MG PO TABS
5.0000 mg | ORAL_TABLET | ORAL | Status: DC | PRN
  Administered 2017-06-23 – 2017-06-26 (×12): 10 mg via ORAL
  Administered 2017-06-26: 5 mg via ORAL
  Administered 2017-06-26 – 2017-07-01 (×8): 10 mg via ORAL
  Filled 2017-06-23 (×2): qty 2
  Filled 2017-06-23: qty 1
  Filled 2017-06-23 (×4): qty 2
  Filled 2017-06-23: qty 1
  Filled 2017-06-23 (×14): qty 2

## 2017-06-23 MED ORDER — SODIUM CHLORIDE 0.9% FLUSH: 10.0000 mL | INTRAVENOUS | Status: DC | PRN

## 2017-06-23 MED ORDER — ACETAMINOPHEN 500 MG PO TABS
1000.0000 mg | ORAL_TABLET | Freq: Four times a day (QID) | ORAL | Status: AC
Start: 2017-06-24 — End: 2017-06-28
  Administered 2017-06-23 – 2017-06-28 (×20): 1000 mg via ORAL
  Filled 2017-06-23 (×20): qty 2

## 2017-06-23 MED ORDER — SODIUM CHLORIDE 0.9% FLUSH
10.0000 mL | Freq: Two times a day (BID) | INTRAVENOUS | Status: DC
  Administered 2017-06-24 (×2): 10 mL
  Administered 2017-06-24: 30 mL
  Administered 2017-06-26 – 2017-07-01 (×3): 10 mL

## 2017-06-23 MED ORDER — METOCLOPRAMIDE HCL 5 MG/ML IJ SOLN
10.0000 mg | Freq: Four times a day (QID) | INTRAMUSCULAR | Status: DC
  Administered 2017-06-23 – 2017-06-26 (×12): 10 mg via INTRAVENOUS
  Filled 2017-06-23 (×11): qty 2

## 2017-06-23 MED ORDER — ROCURONIUM BROMIDE 10 MG/ML (PF) SYRINGE
PREFILLED_SYRINGE | INTRAVENOUS | Status: DC | PRN
  Administered 2017-06-23 (×5): 50 mg via INTRAVENOUS

## 2017-06-23 MED ORDER — SODIUM CHLORIDE 0.9 % IV SOLN
INTRAVENOUS | Status: DC
  Filled 2017-06-23: qty 1

## 2017-06-23 MED ORDER — LACTATED RINGERS IV SOLN
INTRAVENOUS | Status: DC | PRN
  Administered 2017-06-23 (×2): via INTRAVENOUS

## 2017-06-23 MED ORDER — SODIUM CHLORIDE 0.9 % IV SOLN
1.5000 g | Freq: Two times a day (BID) | INTRAVENOUS | Status: AC
  Administered 2017-06-23 – 2017-06-25 (×4): 1.5 g via INTRAVENOUS
  Filled 2017-06-23 (×4): qty 1.5

## 2017-06-23 MED ORDER — DEXMEDETOMIDINE HCL IN NACL 200 MCG/50ML IV SOLN
0.0000 ug/kg/h | INTRAVENOUS | Status: DC
  Administered 2017-06-23: 0.7 ug/kg/h via INTRAVENOUS

## 2017-06-23 MED ORDER — METOPROLOL TARTRATE 5 MG/5ML IV SOLN
INTRAVENOUS | Status: DC | PRN
  Administered 2017-06-23 (×2): 2.5 mg via INTRAVENOUS

## 2017-06-23 MED ORDER — METOPROLOL TARTRATE 12.5 MG HALF TABLET
12.5000 mg | ORAL_TABLET | Freq: Two times a day (BID) | ORAL | Status: DC
  Administered 2017-06-23 – 2017-06-25 (×5): 12.5 mg via ORAL
  Filled 2017-06-23 (×5): qty 1

## 2017-06-23 MED ORDER — ONDANSETRON HCL 4 MG/2ML IJ SOLN: 4.0000 mg | Freq: Four times a day (QID) | INTRAMUSCULAR | Status: DC | PRN

## 2017-06-23 MED ORDER — LACTATED RINGERS IV SOLN
INTRAVENOUS | Status: DC | PRN
  Administered 2017-06-23: 08:00:00 via INTRAVENOUS

## 2017-06-23 MED ORDER — ESMOLOL HCL 100 MG/10ML IV SOLN
INTRAVENOUS | Status: AC
  Filled 2017-06-23: qty 20

## 2017-06-23 MED ORDER — VANCOMYCIN HCL IN DEXTROSE 1-5 GM/200ML-% IV SOLN
1000.0000 mg | Freq: Once | INTRAVENOUS | Status: AC
  Administered 2017-06-23: 1000 mg via INTRAVENOUS
  Filled 2017-06-23: qty 200

## 2017-06-23 MED ORDER — LACTATED RINGERS IV SOLN: 500.0000 mL | Freq: Once | INTRAVENOUS | Status: DC | PRN

## 2017-06-23 MED ORDER — LEVALBUTEROL HCL 1.25 MG/0.5ML IN NEBU
1.2500 mg | INHALATION_SOLUTION | Freq: Four times a day (QID) | RESPIRATORY_TRACT | Status: DC
  Administered 2017-06-23 – 2017-06-26 (×12): 1.25 mg via RESPIRATORY_TRACT
  Filled 2017-06-23 (×12): qty 0.5

## 2017-06-23 MED ORDER — ACETAMINOPHEN 160 MG/5ML PO SOLN
650.0000 mg | Freq: Once | ORAL | Status: AC
  Administered 2017-06-23: 650 mg
  Filled 2017-06-23: qty 20.3

## 2017-06-23 MED ORDER — PROPOFOL 10 MG/ML IV BOLUS
INTRAVENOUS | Status: AC
  Filled 2017-06-23: qty 20

## 2017-06-23 MED ORDER — MIDAZOLAM HCL 5 MG/5ML IJ SOLN
INTRAMUSCULAR | Status: DC | PRN
  Administered 2017-06-23 (×2): 2 mg via INTRAVENOUS
  Administered 2017-06-23: 4 mg via INTRAVENOUS
  Administered 2017-06-23: 2 mg via INTRAVENOUS

## 2017-06-23 MED ORDER — SODIUM CHLORIDE 0.9% FLUSH: 3.0000 mL | INTRAVENOUS | Status: DC | PRN

## 2017-06-23 MED ORDER — DEXMEDETOMIDINE HCL IN NACL 400 MCG/100ML IV SOLN
INTRAVENOUS | Status: DC | PRN
  Administered 2017-06-23: 0.7 ug/kg/h via INTRAVENOUS

## 2017-06-23 MED ORDER — SODIUM CHLORIDE 0.9% FLUSH
3.0000 mL | Freq: Two times a day (BID) | INTRAVENOUS | Status: DC
  Administered 2017-06-24: 10 mL via INTRAVENOUS
  Administered 2017-06-24: 3 mL via INTRAVENOUS
  Administered 2017-06-25: 9 mL via INTRAVENOUS
  Administered 2017-06-25 – 2017-07-02 (×9): 3 mL via INTRAVENOUS

## 2017-06-23 MED ORDER — CHLORHEXIDINE GLUCONATE CLOTH 2 % EX PADS
6.0000 | MEDICATED_PAD | Freq: Every day | CUTANEOUS | Status: DC
  Administered 2017-06-23 – 2017-07-01 (×5): 6 via TOPICAL

## 2017-06-23 MED ORDER — SODIUM CHLORIDE 0.9 % IV SOLN
0.0000 ug/min | INTRAVENOUS | Status: DC
  Filled 2017-06-23: qty 2

## 2017-06-23 MED ORDER — FAMOTIDINE IN NACL 20-0.9 MG/50ML-% IV SOLN: 20.0000 mg | Freq: Two times a day (BID) | INTRAVENOUS | Status: AC

## 2017-06-23 MED ORDER — PROPOFOL 10 MG/ML IV BOLUS
INTRAVENOUS | Status: DC | PRN
  Administered 2017-06-23 (×2): 50 mg via INTRAVENOUS

## 2017-06-23 MED ORDER — PHENYLEPHRINE HCL 10 MG/ML IJ SOLN
INTRAVENOUS | Status: DC | PRN
  Administered 2017-06-23: 30 ug/min via INTRAVENOUS

## 2017-06-23 MED ORDER — ATORVASTATIN CALCIUM 80 MG PO TABS
80.0000 mg | ORAL_TABLET | Freq: Every day | ORAL | Status: DC
  Administered 2017-06-24 – 2017-07-03 (×10): 80 mg via ORAL
  Filled 2017-06-23 (×10): qty 1

## 2017-06-23 MED ORDER — MAGNESIUM SULFATE 4 GM/100ML IV SOLN
4.0000 g | Freq: Once | INTRAVENOUS | Status: AC
  Administered 2017-06-23: 4 g via INTRAVENOUS
  Filled 2017-06-23: qty 100

## 2017-06-23 MED ORDER — POTASSIUM CHLORIDE 10 MEQ/50ML IV SOLN: 10.0000 meq | INTRAVENOUS | Status: AC

## 2017-06-23 MED ORDER — TRAMADOL HCL 50 MG PO TABS
50.0000 mg | ORAL_TABLET | ORAL | Status: DC | PRN
  Administered 2017-06-24 – 2017-07-03 (×9): 100 mg via ORAL
  Filled 2017-06-23 (×10): qty 2

## 2017-06-23 MED ORDER — FENTANYL CITRATE (PF) 250 MCG/5ML IJ SOLN
INTRAMUSCULAR | Status: AC
Start: 2017-06-23 — End: ?
  Filled 2017-06-23: qty 25

## 2017-06-23 MED ORDER — ACETAMINOPHEN 650 MG RE SUPP: 650.0000 mg | Freq: Once | RECTAL | Status: AC

## 2017-06-23 MED ORDER — PROTAMINE SULFATE 10 MG/ML IV SOLN
INTRAVENOUS | Status: AC
  Filled 2017-06-23: qty 10

## 2017-06-23 MED ORDER — SODIUM CHLORIDE 0.9 % IV SOLN
INTRAVENOUS | Status: DC | PRN
  Administered 2017-06-23: 750 mg via INTRAVENOUS

## 2017-06-23 MED ORDER — NITROPRUSSIDE SODIUM 25 MG/ML IV SOLN
0.0000 ug/kg/min | INTRAVENOUS | Status: DC
  Filled 2017-06-23: qty 2

## 2017-06-23 MED ORDER — LACTATED RINGERS IV SOLN: INTRAVENOUS | Status: DC

## 2017-06-23 MED ORDER — ASPIRIN EC 325 MG PO TBEC
325.0000 mg | DELAYED_RELEASE_TABLET | Freq: Every day | ORAL | Status: DC
  Administered 2017-06-24 – 2017-07-03 (×10): 325 mg via ORAL
  Filled 2017-06-23 (×10): qty 1

## 2017-06-23 MED ORDER — HEPARIN SODIUM (PORCINE) 1000 UNIT/ML IJ SOLN
INTRAMUSCULAR | Status: DC | PRN
  Administered 2017-06-23: 2000 [IU] via INTRAVENOUS
  Administered 2017-06-23: 26000 [IU] via INTRAVENOUS

## 2017-06-23 MED ORDER — PANTOPRAZOLE SODIUM 40 MG PO TBEC
40.0000 mg | DELAYED_RELEASE_TABLET | Freq: Every day | ORAL | Status: DC
  Administered 2017-06-25 – 2017-07-03 (×9): 40 mg via ORAL
  Filled 2017-06-23 (×9): qty 1

## 2017-06-23 MED ORDER — HEMOSTATIC AGENTS (NO CHARGE) OPTIME
TOPICAL | Status: DC | PRN
  Administered 2017-06-23 (×3): 1 via TOPICAL

## 2017-06-23 MED ORDER — ESMOLOL HCL 100 MG/10ML IV SOLN
INTRAVENOUS | Status: DC | PRN
  Administered 2017-06-23 (×2): 50 mg via INTRAVENOUS

## 2017-06-23 MED ORDER — 0.9 % SODIUM CHLORIDE (POUR BTL) OPTIME
TOPICAL | Status: DC | PRN
  Administered 2017-06-23 (×5): 1000 mL

## 2017-06-23 MED ORDER — MILRINONE LACTATE IN DEXTROSE 20-5 MG/100ML-% IV SOLN
0.1250 ug/kg/min | INTRAVENOUS | Status: DC
  Administered 2017-06-23: 0.3 ug/kg/min via INTRAVENOUS
  Administered 2017-06-24 (×2): 0.125 ug/kg/min via INTRAVENOUS
  Filled 2017-06-23 (×2): qty 100

## 2017-06-23 MED ORDER — SODIUM CHLORIDE 0.9 % IV SOLN: 250.0000 mL | INTRAVENOUS | Status: DC

## 2017-06-23 MED ORDER — METOPROLOL TARTRATE 5 MG/5ML IV SOLN
INTRAVENOUS | Status: AC
  Filled 2017-06-23: qty 10

## 2017-06-23 MED ORDER — ACETAMINOPHEN 160 MG/5ML PO SOLN: 1000.0000 mg | Freq: Four times a day (QID) | ORAL | Status: AC

## 2017-06-23 MED ORDER — LEVALBUTEROL HCL 1.25 MG/0.5ML IN NEBU: 1.2500 mg | INHALATION_SOLUTION | Freq: Four times a day (QID) | RESPIRATORY_TRACT | Status: DC

## 2017-06-23 MED ORDER — MORPHINE SULFATE (PF) 2 MG/ML IV SOLN
1.0000 mg | INTRAVENOUS | Status: AC | PRN
  Administered 2017-06-23 (×3): 2 mg via INTRAVENOUS
  Filled 2017-06-23 (×3): qty 1

## 2017-06-23 MED ORDER — LIDOCAINE 2% (20 MG/ML) 5 ML SYRINGE
INTRAMUSCULAR | Status: AC
  Filled 2017-06-23: qty 5

## 2017-06-23 MED ORDER — SODIUM CHLORIDE 0.9 % IV SOLN: Freq: Once | INTRAVENOUS | Status: DC

## 2017-06-23 MED ORDER — MIDAZOLAM HCL 10 MG/2ML IJ SOLN
INTRAMUSCULAR | Status: AC
  Filled 2017-06-23: qty 2

## 2017-06-23 MED ORDER — SODIUM CHLORIDE 0.45 % IV SOLN: INTRAVENOUS | Status: DC | PRN

## 2017-06-23 MED ORDER — BISACODYL 10 MG RE SUPP: 10.0000 mg | Freq: Every day | RECTAL | Status: DC

## 2017-06-23 MED ORDER — PROTAMINE SULFATE 10 MG/ML IV SOLN
INTRAVENOUS | Status: AC
  Filled 2017-06-23: qty 25

## 2017-06-23 SURGICAL SUPPLY — 94 items
ADAPTER CARDIO PERF ANTE/RETRO (ADAPTER) ×4 IMPLANT
BAG DECANTER FOR FLEXI CONT (MISCELLANEOUS) ×4 IMPLANT
BANDAGE ACE 4X5 VEL STRL LF (GAUZE/BANDAGES/DRESSINGS) ×4 IMPLANT
BANDAGE ACE 6X5 VEL STRL LF (GAUZE/BANDAGES/DRESSINGS) ×4 IMPLANT
BASKET HEART  (ORDER IN 25'S) (MISCELLANEOUS) ×1
BASKET HEART (ORDER IN 25'S) (MISCELLANEOUS) ×1
BASKET HEART (ORDER IN 25S) (MISCELLANEOUS) ×2 IMPLANT
BLADE CLIPPER SURG (BLADE) IMPLANT
BLADE STERNUM SYSTEM 6 (BLADE) ×4 IMPLANT
BLADE SURG 11 STRL SS (BLADE) ×4 IMPLANT
BLADE SURG 12 STRL SS (BLADE) ×4 IMPLANT
BNDG GAUZE ELAST 4 BULKY (GAUZE/BANDAGES/DRESSINGS) ×4 IMPLANT
CANISTER SUCT 3000ML PPV (MISCELLANEOUS) ×4 IMPLANT
CANNULA GUNDRY RCSP 15FR (MISCELLANEOUS) ×4 IMPLANT
CATH CPB KIT VANTRIGT (MISCELLANEOUS) ×4 IMPLANT
CATH ROBINSON RED A/P 18FR (CATHETERS) ×12 IMPLANT
CATH THORACIC 36FR RT ANG (CATHETERS) ×4 IMPLANT
CRADLE DONUT ADULT HEAD (MISCELLANEOUS) ×4 IMPLANT
DRAIN CHANNEL 32F RND 10.7 FF (WOUND CARE) ×4 IMPLANT
DRAPE CARDIOVASCULAR INCISE (DRAPES) ×2
DRAPE SLUSH/WARMER DISC (DRAPES) ×4 IMPLANT
DRAPE SRG 135X102X78XABS (DRAPES) ×2 IMPLANT
DRSG AQUACEL AG ADV 3.5X14 (GAUZE/BANDAGES/DRESSINGS) ×4 IMPLANT
ELECT BLADE 4.0 EZ CLEAN MEGAD (MISCELLANEOUS) ×4
ELECT BLADE 6.5 EXT (BLADE) ×4 IMPLANT
ELECT CAUTERY BLADE 6.4 (BLADE) ×4 IMPLANT
ELECT REM PT RETURN 9FT ADLT (ELECTROSURGICAL) ×8
ELECTRODE BLDE 4.0 EZ CLN MEGD (MISCELLANEOUS) ×2 IMPLANT
ELECTRODE REM PT RTRN 9FT ADLT (ELECTROSURGICAL) ×4 IMPLANT
FELT TEFLON 1X6 (MISCELLANEOUS) ×4 IMPLANT
GAUZE SPONGE 4X4 12PLY STRL (GAUZE/BANDAGES/DRESSINGS) ×8 IMPLANT
GLOVE BIO SURGEON STRL SZ 6.5 (GLOVE) ×12 IMPLANT
GLOVE BIO SURGEON STRL SZ7.5 (GLOVE) ×12 IMPLANT
GLOVE BIO SURGEONS STRL SZ 6.5 (GLOVE) ×4
GLOVE SURG SS PI 6.0 STRL IVOR (GLOVE) ×12 IMPLANT
GOWN STRL REUS W/ TWL LRG LVL3 (GOWN DISPOSABLE) ×14 IMPLANT
GOWN STRL REUS W/TWL LRG LVL3 (GOWN DISPOSABLE) ×14
HEMOSTAT POWDER SURGIFOAM 1G (HEMOSTASIS) IMPLANT
HEMOSTAT SURGICEL 2X14 (HEMOSTASIS) ×4 IMPLANT
INSERT FOGARTY XLG (MISCELLANEOUS) IMPLANT
KIT BASIN OR (CUSTOM PROCEDURE TRAY) ×4 IMPLANT
KIT SUCTION CATH 14FR (SUCTIONS) ×4 IMPLANT
KIT TURNOVER KIT B (KITS) ×4 IMPLANT
KIT VASOVIEW HEMOPRO VH 3000 (KITS) ×4 IMPLANT
LEAD PACING MYOCARDI (MISCELLANEOUS) ×4 IMPLANT
MARKER GRAFT CORONARY BYPASS (MISCELLANEOUS) ×12 IMPLANT
NS IRRIG 1000ML POUR BTL (IV SOLUTION) ×24 IMPLANT
PACK E OPEN HEART (SUTURE) ×4 IMPLANT
PACK OPEN HEART (CUSTOM PROCEDURE TRAY) ×4 IMPLANT
PAD ARMBOARD 7.5X6 YLW CONV (MISCELLANEOUS) ×8 IMPLANT
PAD ELECT DEFIB RADIOL ZOLL (MISCELLANEOUS) ×4 IMPLANT
PENCIL BUTTON HOLSTER BLD 10FT (ELECTRODE) ×4 IMPLANT
PUNCH AORTIC ROTATE 4.0MM (MISCELLANEOUS) IMPLANT
PUNCH AORTIC ROTATE 4.5MM 8IN (MISCELLANEOUS) ×4 IMPLANT
PUNCH AORTIC ROTATE 5MM 8IN (MISCELLANEOUS) IMPLANT
SET CARDIOPLEGIA MPS 5001102 (MISCELLANEOUS) ×4 IMPLANT
SOLUTION ANTI FOG 6CC (MISCELLANEOUS) ×4 IMPLANT
SPONGE LAP 18X18 X RAY DECT (DISPOSABLE) ×4 IMPLANT
SURGIFLO W/THROMBIN 8M KIT (HEMOSTASIS) IMPLANT
SUT BONE WAX W31G (SUTURE) ×4 IMPLANT
SUT MNCRL AB 4-0 PS2 18 (SUTURE) ×4 IMPLANT
SUT PROLENE 3 0 SH DA (SUTURE) IMPLANT
SUT PROLENE 3 0 SH1 36 (SUTURE) IMPLANT
SUT PROLENE 4 0 RB 1 (SUTURE) ×2
SUT PROLENE 4 0 SH DA (SUTURE) ×4 IMPLANT
SUT PROLENE 4-0 RB1 .5 CRCL 36 (SUTURE) ×2 IMPLANT
SUT PROLENE 5 0 C 1 36 (SUTURE) IMPLANT
SUT PROLENE 6 0 C 1 30 (SUTURE) ×4 IMPLANT
SUT PROLENE 6 0 CC (SUTURE) ×12 IMPLANT
SUT PROLENE 7 0 BV 1 (SUTURE) IMPLANT
SUT PROLENE 8 0 BV175 6 (SUTURE) ×12 IMPLANT
SUT PROLENE BLUE 7 0 (SUTURE) ×8 IMPLANT
SUT PROLENE POLY MONO (SUTURE) ×4 IMPLANT
SUT SILK  1 MH (SUTURE)
SUT SILK 1 MH (SUTURE) IMPLANT
SUT SILK 2 0 SH CR/8 (SUTURE) IMPLANT
SUT SILK 3 0 SH CR/8 (SUTURE) IMPLANT
SUT STEEL 6MS V (SUTURE) ×8 IMPLANT
SUT STEEL SZ 6 DBL 3X14 BALL (SUTURE) ×4 IMPLANT
SUT VIC AB 1 CTX 36 (SUTURE) ×4
SUT VIC AB 1 CTX36XBRD ANBCTR (SUTURE) ×4 IMPLANT
SUT VIC AB 2-0 CT1 27 (SUTURE) ×2
SUT VIC AB 2-0 CT1 TAPERPNT 27 (SUTURE) ×2 IMPLANT
SUT VIC AB 2-0 CTX 27 (SUTURE) IMPLANT
SUT VIC AB 3-0 X1 27 (SUTURE) IMPLANT
SYSTEM SAHARA CHEST DRAIN ATS (WOUND CARE) ×4 IMPLANT
TAPE CLOTH SURG 4X10 WHT LF (GAUZE/BANDAGES/DRESSINGS) ×8 IMPLANT
TAPE PAPER 2X10 WHT MICROPORE (GAUZE/BANDAGES/DRESSINGS) ×4 IMPLANT
TOWEL GREEN STERILE (TOWEL DISPOSABLE) ×4 IMPLANT
TOWEL GREEN STERILE FF (TOWEL DISPOSABLE) ×4 IMPLANT
TRAY FOLEY SLVR 16FR TEMP STAT (SET/KITS/TRAYS/PACK) ×4 IMPLANT
TUBING INSUFFLATION (TUBING) ×4 IMPLANT
UNDERPAD 30X30 (UNDERPADS AND DIAPERS) ×4 IMPLANT
WATER STERILE IRR 1000ML POUR (IV SOLUTION) ×8 IMPLANT

## 2017-06-23 NOTE — Anesthesia Procedure Notes (Signed)
Arterial Line Insertion Start/End5/28/2019 6:30 AM, 06/23/2017 6:40 AM Performed by: Adonis Housekeeper, CRNA  Patient location: Pre-op. Preanesthetic checklist: patient identified, IV checked, site marked, risks and benefits discussed, surgical consent, monitors and equipment checked, pre-op evaluation and anesthesia consent Lidocaine 1% used for infiltration and patient sedated Left, radial was placed Catheter size: 20 G Hand hygiene performed , maximum sterile barriers used  and Seldinger technique used Allen's test indicative of satisfactory collateral circulation Attempts: 1 Procedure performed without using ultrasound guided technique. Following insertion, Biopatch and dressing applied. Post procedure assessment: normal

## 2017-06-23 NOTE — Anesthesia Preprocedure Evaluation (Addendum)
Anesthesia Evaluation  Patient identified by MRN, date of birth, ID band Patient awake    Reviewed: Allergy & Precautions, NPO status , Patient's Chart, lab work & pertinent test results  Airway Mallampati: II  TM Distance: <3 FB Neck ROM: Full    Dental  (+) Partial Lower, Partial Upper, Dental Advisory Given   Pulmonary    breath sounds clear to auscultation       Cardiovascular hypertension,  Rhythm:Regular Rate:Normal     Neuro/Psych    GI/Hepatic   Endo/Other  diabetes  Renal/GU      Musculoskeletal   Abdominal   Peds  Hematology   Anesthesia Other Findings   Reproductive/Obstetrics                             Anesthesia Physical Anesthesia Plan  ASA: III  Anesthesia Plan: General   Post-op Pain Management:    Induction: Intravenous  PONV Risk Score and Plan: 1 and Ondansetron and Dexamethasone  Airway Management Planned: Oral ETT  Additional Equipment: Arterial line, CVP, PA Cath, 3D TEE and Ultrasound Guidance Line Placement  Intra-op Plan:   Post-operative Plan: Post-operative intubation/ventilation  Informed Consent: I have reviewed the patients History and Physical, chart, labs and discussed the procedure including the risks, benefits and alternatives for the proposed anesthesia with the patient or authorized representative who has indicated his/her understanding and acceptance.     Plan Discussed with: CRNA and Anesthesiologist  Anesthesia Plan Comments:         Anesthesia Quick Evaluation

## 2017-06-23 NOTE — Procedures (Signed)
Extubation Procedure Note  Patient Details:   Name: Marcus VANTOL Sr. DOB: [birth year] MRN: [id number]   Airway Documentation:  Airway 8 mm (Active)  Secured at (cm) 20 cm 06/23/2017  7:40 PM  Measured From Lips 06/23/2017  7:40 PM  Secured Location Left 06/23/2017  7:40 PM  Secured By Pink Tape 06/23/2017  7:40 PM  Tube Holder Repositioned Yes 06/23/2017  1:17 PM  Cuff Pressure (cm H2O) 28 cm H2O 06/23/2017  7:40 PM  Site Condition Dry 06/23/2017  3:23 PM   Vent end date: 06/23/17 Vent end time: 2053   Evaluation  O2 sats: stable throughout Complications: No apparent complications Patient did tolerate procedure well. Bilateral Breath Sounds: Clear   Patient extubated to 4 lpm [place]. Able to vocalize name post extubation. NIV -26, VC 1.4 L.   Clent Ridges 06/23/2017, 9:07 PM

## 2017-06-23 NOTE — Anesthesia Procedure Notes (Signed)
Central Venous Catheter Insertion Performed by: Kipp Brood, MD, anesthesiologist Start/End5/28/2019 6:50 AM, 06/23/2017 7:00 AM Patient location: Pre-op. Preanesthetic checklist: patient identified, IV checked, site marked, risks and benefits discussed, surgical consent, monitors and equipment checked, pre-op evaluation, timeout performed and anesthesia consent Hand hygiene performed  and maximum sterile barriers used  PA cath was placed.Swan type:thermodilution Procedure performed without using ultrasound guided technique. Attempts: 1 Patient tolerated the procedure well with no immediate complications.

## 2017-06-23 NOTE — Anesthesia Procedure Notes (Signed)
Central Venous Catheter Insertion Performed by: Kipp Brood, MD, anesthesiologist Start/End5/28/2019 6:50 AM, 06/23/2017 7:00 AM Patient location: Pre-op. Preanesthetic checklist: patient identified, IV checked, site marked, risks and benefits discussed, surgical consent, monitors and equipment checked, pre-op evaluation, timeout performed and anesthesia consent Lidocaine 1% used for infiltration and patient sedated Hand hygiene performed  and maximum sterile barriers used  Catheter size: 8.5 Fr Sheath introducer Procedure performed using ultrasound guided technique. Ultrasound Notes:anatomy identified, needle tip was noted to be adjacent to the nerve/plexus identified, no ultrasound evidence of intravascular and/or intraneural injection and image(s) printed for medical record Attempts: 1 Following insertion, line sutured and dressing applied. Post procedure assessment: blood return through all ports, free fluid flow and no air  Patient tolerated the procedure well with no immediate complications.

## 2017-06-23 NOTE — Transfer of Care (Signed)
Immediate Anesthesia Transfer of Care Note  Patient: Marcus BROUGHTON Sr.  Procedure(s) Performed: CORONARY ARTERY BYPASS GRAFTING (CABG) x 3; Using Left Internal Mammary Artery and Right Leg Greater Saphenous Vein harvested endoscopically. (N/A Chest) TRANSESOPHAGEAL ECHOCARDIOGRAM (TEE) (N/A )  Patient Location: ICU  Anesthesia Type:General  Level of Consciousness: sedated and Patient remains intubated per anesthesia plan  Airway & Oxygen Therapy: Patient remains intubated per anesthesia plan and Patient placed on Ventilator (see vital sign flow sheet for setting)  Post-op Assessment: Report given to RN and Post -op Vital signs reviewed and stable ABP 116/63; spo2 100% HR 94  Post vital signs: Reviewed and stable  Last Vitals:  Vitals Value Taken Time  BP 99/60 06/23/2017  1:21 PM  Temp 35.3 C 06/23/2017  1:26 PM  Pulse 93 06/23/2017  1:26 PM  Resp 16 06/23/2017  1:26 PM  SpO2 100 % 06/23/2017  1:26 PM  Vitals shown include unvalidated device data.  Last Pain:  Vitals:   06/23/17 0411  TempSrc: Oral  PainSc:       Patients Stated Pain Goal: 0 (06/20/17 2000)  Complications: No apparent anesthesia complications

## 2017-06-23 NOTE — Anesthesia Procedure Notes (Signed)
Procedure Name: Intubation Date/Time: 06/23/2017 8:10 AM Performed by: Shirlyn Goltz, CRNA Pre-anesthesia Checklist: Patient identified, Emergency Drugs available, Suction available and Patient being monitored Patient Re-evaluated:Patient Re-evaluated prior to induction Oxygen Delivery Method: Circle system utilized Preoxygenation: Pre-oxygenation with 100% oxygen Induction Type: IV induction Ventilation: Mask ventilation without difficulty Laryngoscope Size: Mac and 4 Grade View: Grade II Tube type: Oral Tube size: 8.0 mm Number of attempts: 1 Airway Equipment and Method: Stylet Placement Confirmation: ETT inserted through vocal cords under direct vision,  positive ETCO2 and breath sounds checked- equal and bilateral Secured at: 20 cm Tube secured with: Tape Dental Injury: Teeth and Oropharynx as per pre-operative assessment

## 2017-06-23 NOTE — Progress Notes (Signed)
Pre Procedure note for inpatients:   Marcus WHEDBEE Sr. has been scheduled for Procedure(s): CORONARY ARTERY BYPASS GRAFTING (CABG) (N/A) TRANSESOPHAGEAL ECHOCARDIOGRAM (TEE) (N/A) today. The various methods of treatment have been discussed with the patient. After consideration of the risks, benefits and treatment options the patient has consented to the planned procedure.   The patient has been seen and labs reviewed. There are no changes in the patient's condition to prevent proceeding with the planned procedure today.  Recent labs:  Lab Results  Component Value Date   WBC 6.3 06/23/2017   HGB 13.7 06/23/2017   HCT 40.6 06/23/2017   PLT 225 06/23/2017   GLUCOSE 118 (H) 06/23/2017   CHOL 199 06/19/2017   TRIG 83 06/19/2017   HDL 36 (L) 06/19/2017   LDLCALC 146 (H) 06/19/2017   ALT 50 06/19/2017   AST 39 06/19/2017   NA 138 06/23/2017   K 3.9 06/23/2017   CL 108 06/23/2017   CREATININE 0.99 06/23/2017   BUN 13 06/23/2017   CO2 22 06/23/2017   TSH 2.353 06/19/2017   INR 1.03 06/19/2017   HGBA1C 7.4 (H) 06/17/2017    Mikey Bussing, MD 06/23/2017 7:47 AM

## 2017-06-23 NOTE — Progress Notes (Signed)
Pt noted to have 12.5mg  metoprolol ordered prior to surgery.  Pt received  PO 5/27 @ 2158 and has been bradycardic on monitor with HR 49-55.  Upon waking patient's HR increased to 65 non-sustained then decreased below 60.  Scheduled metoprolol held this morning due to HR less than 60.

## 2017-06-23 NOTE — Brief Op Note (Signed)
06/17/2017 - 06/23/2017  3:21 PM  PATIENT:  Marcus Bathe Sr.  52 y.o. male  PRE-OPERATIVE DIAGNOSIS:  CAD  POST-OPERATIVE DIAGNOSIS:  CAD  PROCEDURE:  Procedure(s): CORONARY ARTERY BYPASS GRAFTING (CABG) x 3; Using Left Internal Mammary Artery and Right Leg Greater Saphenous Vein harvested endoscopically. (N/A) TRANSESOPHAGEAL ECHOCARDIOGRAM (TEE) (N/A)  SURGEON:  Surgeon(s) and Role:    Kerin Perna, MD - Primary  PHYSICIAN ASSISTANT:  Jari Favre, PA-C   ANESTHESIA:   general  EBL:  650 mL   BLOOD ADMINISTERED:none  DRAINS: routine   LOCAL MEDICATIONS USED:  NONE  SPECIMEN:  No Specimen  DISPOSITION OF SPECIMEN:  N/A  COUNTS:  YES  DICTATION: .Dragon Dictation  PLAN OF CARE: Admit to inpatient   PATIENT DISPOSITION:  ICU - intubated and hemodynamically stable.   Delay start of Pharmacological VTE agent (>24hrs) due to surgical blood loss or risk of bleeding: yes

## 2017-06-23 NOTE — Progress Notes (Signed)
  Echocardiogram Echocardiogram Transesophageal has been performed.  Leta Jungling M 06/23/2017, 8:31 AM

## 2017-06-23 NOTE — Progress Notes (Signed)
Pt with wallet, cellphone, clothing and black sportswatch.  Patient's sister, Priscella Mann, in room prior to OR receiving pt.  Pt verbalizes sister to take possession of belongings.  Pt's sister Priscella Mann confirms she has received belongings for patient and possession will remain with her throughout patient's surgery.

## 2017-06-23 NOTE — Op Note (Signed)
NAME: Marcus SR., Marcus L. MEDICAL RECORD ZO:[id number] ACCOUNT [id number] DATE OF BIRTH:01/31/1965 FACILITY: [facility] LOCATION: [facility] PHYSICIAN:Wynn Alldredge VAN TRIGT III, MD  OPERATIVE REPORT  DATE OF PROCEDURE:  06/23/2017  OPERATION: 1.  Coronary artery bypass grafting x3 (left internal mammary artery to left anterior descending, saphenous vein graft to posterior descending, saphenous vein graft to obtuse marginal. 2.  Endoscopic harvest of right leg greater saphenous vein.  SURGEON:  Mikey Bussing, M.D.  ASSISTANT:  Boris Lown, PA-C.  POSTOPERATIVE DIAGNOSES:  Non-ST elevation myocardial  infarction, severe left main and 3-vessel coronary artery disease.  ANESTHESIA:  General by Kipp Brood, MD.  CLINICAL NOTE:  The patient is 52 years old and had undergone previous 2 vessel PCI at an outside hospital.  He presented with chest pain of increasing intensity and frequency and occurring at rest with positive cardiac enzymes.  He was admitted and  treated for non-STEMI.  Cardiac catheterization demonstrated left main stenosis as well as moderate 75% stenosis of the RCA.  The distal circumflex was occluded and filled via collaterals, but was a small vessel.  LV function was fairly well preserved  and there was no significant valvular disease on echo.  Surgical coronary revascularization was recommended by the patient's cardiologist.  I examined the patient and discussed with him his coronary angiograms and echocardiogram images which I had  reviewed independently.  I discussed the role of CABG for treatment of his severe left main and 3-vessel CAD.  I discussed the major details of surgery including the choice of conduit to include mammary artery and endoscopically harvested vein, the  location of the surgical incisions, the use of general anesthesia and cardiopulmonary bypass, and the expected postoperative hospital recovery period.  I discussed with the patient the risks to him of  coronary artery bypass grafting including the risk of  bleeding (The patient had been on preoperative Plavix), stroke, postoperative pulmonary problems including pleural effusion, postoperative ventilator dependence because of his history of smoking and COPD, postoperative infection, postoperative MI,  postoperative organ failure, and death.  After reviewing these issues, he demonstrated his understanding and agreed to proceed with surgery under what I felt was an informed consent.  OPERATIVE FINDINGS: 1.  Adequate conduit. 2.  Distal circumflex was atretic and too small to graft. 3.  Deeply intramyocardial OM branches. 4.  Preserved LV function.  After separation from cardiopulmonary bypass by TEE.  DESCRIPTION OF PROCEDURE:  The patient was brought to the operating room and placed supine on the operating table.  General anesthesia was induced under invasive hemodynamic monitoring.  The chest, abdomen and legs were prepped with Betadine and draped  as a sterile field.  A transesophageal echo probe was placed by the anesthesia team.  The patient was prepped and draped as a sterile field.  During induction of anesthesia, the patient had an episode of hypertension, which was treated with short acting  beta blockers - Esmolol and anesthesia agent.  A sternal incision was made as the saphenous vein was harvested endoscopically from the right leg.  The left internal mammary artery was harvested as a pedicle graft.  It was a 1.5 mm vessel with good flow.  The sternal retractor was placed and the  pericardium was opened and suspended.  Pursestrings were placed in the ascending aorta and right atrium and after the vein was harvested, heparin was administered.  The patient was cannulated and placed on cardiopulmonary bypass.  The coronaries were  identified for grafting.  The mammary artery and vein grafts were prepared for the distal anastomoses and cardioplegia cannulas were placed for both antegrade and  retrograde cold blood cardioplegia.  The patient was cooled to 32 degrees and the aortic  crossclamp was applied.  One liter of cold blood cardioplegia was delivered in split doses between the antegrade aortic and retrograde coronary sinus catheters.  There was good cardioplegic arrest and temperature dropped less than 12 degrees.   Cardioplegia was delivered every 20 minutes.  The distal coronary anastomoses were performed.  The first distal anastomosis was the second posterior descending.  This was the largest distal RCA branch.  It was 1.5 mm.  A reverse saphenous vein was sewn end-to-side with running 7-0 Prolene.  There  was good flow through the graft.  Cardioplegia was redosed.  The second distal anastomosis was the OM branch of the circumflex which had a proximal 90% left main stenosis.  It was deep intramyocardial.  A reverse saphenous vein was sewn end-to-side with running 7-0 Prolene.  There was good flow through the graft.   Cardioplegia was redosed.  A third distal anastomosis was the distal third LAD.  It had a proximal 90% left main stenosis.  The left IMA pedicle was brought through an opening and the left lateral pericardium was brought down onto to the LAD and sewn end-to-side with a running 8-0  Prolene.  There was good flow through the anastomosis after briefly removing the pedicle bulldog on the mammary pedicle.  The bulldog was reapplied and the pedicle was secured to the epicardium with 6-0 Prolenes.  Cardioplegia was redosed.  The cross clamp was still in place.  Two proximal vein anastomoses were performed on the ascending aorta using a 4.5 mm punch and running 6-0 Prolene.  Prior to tying down the final proximal anastomosis, areas were infiltrated in the coronaries with a  dose of retrograde warm blood cardioplegia.  The crossclamp was removed.  The vein grafts were deaired and opened, each having good flow.  Hemostasis was documented at the proximal and distal sites.  The  patient was rewarmed to reperfuse.  Heart was cardioverted back to regular rhythm with 1 shock.  The lungs were expanded.   The ventilator was resumed.  The patient was started on low dose milrinone and then transitioned from cardiopulmonary bypass to a normal circulatory cardiac support.  Hemodynamics were stable.  Cardiac output was normal.  Echo showed preserved global LV  function.  Protamine was administered without adverse reaction.  The patient still had coagulopathy after reversal of heparin and was given 2 FFPs with improved coagulation function.  The anterior mediastinum and left pleural chest tubes were placed and brought out through separate incisions.  The sternum was closed with a wire.  The patient remained stable.  The pectoralis fascia was closed with a running #1 Vicryl.  Subcutaneous and  skin layers were closed with a running Vicryl.  The patient returned to the ICU from the OR in stable condition.  Total cardiopulmonary bypass time was 108 minutes.  AN/NUANCE  D:06/23/2017 T:06/23/2017 JOB:[id number]

## 2017-06-23 NOTE — Progress Notes (Signed)
CT surgery p.m. Rounds  Hemodynamics stable after CABG Slow to wake up from anesthesia-Precedex off Chest tube output satisfactory Lungs with scattered rhonchi-history of smoking When patient more alert and responsive proceed with ventilator weaning protocol

## 2017-06-24 ENCOUNTER — Inpatient Hospital Stay (HOSPITAL_COMMUNITY): Payer: Self-pay

## 2017-06-24 ENCOUNTER — Encounter (HOSPITAL_COMMUNITY): Payer: Self-pay | Admitting: Cardiothoracic Surgery

## 2017-06-24 LAB — CBC
HCT: 29.6 % — ABNORMAL LOW (ref 39.0–52.0)
HCT: 31.1 % — ABNORMAL LOW (ref 39.0–52.0)
Hemoglobin: 10.2 g/dL — ABNORMAL LOW (ref 13.0–17.0)
Hemoglobin: 10.5 g/dL — ABNORMAL LOW (ref 13.0–17.0)
MCH: 30.4 pg (ref 26.0–34.0)
MCH: 30.5 pg (ref 26.0–34.0)
MCHC: 34.5 g/dL (ref 30.0–36.0)
MCHC: 33.8 g/dL (ref 30.0–36.0)
MCV: 88.4 fL (ref 78.0–100.0)
MCV: 90.4 fL (ref 78.0–100.0)
PLATELETS: 123 10*3/uL — AB (ref 150–400)
PLATELETS: 133 10*3/uL — AB (ref 150–400)
RBC: 3.35 MIL/uL — ABNORMAL LOW (ref 4.22–5.81)
RBC: 3.44 MIL/uL — ABNORMAL LOW (ref 4.22–5.81)
RDW: 14.6 % (ref 11.5–15.5)
RDW: 15.1 % (ref 11.5–15.5)
WBC: 8.2 10*3/uL (ref 4.0–10.5)
WBC: 10.1 10*3/uL (ref 4.0–10.5)

## 2017-06-24 LAB — GLUCOSE, CAPILLARY
GLUCOSE-CAPILLARY: 107 mg/dL — AB (ref 65–99)
GLUCOSE-CAPILLARY: 104 mg/dL — AB (ref 65–99)
GLUCOSE-CAPILLARY: 100 mg/dL — AB (ref 65–99)
GLUCOSE-CAPILLARY: 91 mg/dL (ref 65–99)
GLUCOSE-CAPILLARY: 77 mg/dL (ref 65–99)
GLUCOSE-CAPILLARY: 118 mg/dL — AB (ref 65–99)
GLUCOSE-CAPILLARY: 146 mg/dL — AB (ref 65–99)
GLUCOSE-CAPILLARY: 171 mg/dL — AB (ref 65–99)
Glucose-Capillary: 103 mg/dL — ABNORMAL HIGH (ref 65–99)
Glucose-Capillary: 112 mg/dL — ABNORMAL HIGH (ref 65–99)
Glucose-Capillary: 123 mg/dL — ABNORMAL HIGH (ref 65–99)
Glucose-Capillary: 129 mg/dL — ABNORMAL HIGH (ref 65–99)
Glucose-Capillary: 134 mg/dL — ABNORMAL HIGH (ref 65–99)
Glucose-Capillary: 229 mg/dL — ABNORMAL HIGH (ref 65–99)
Glucose-Capillary: 97 mg/dL (ref 65–99)
Glucose-Capillary: 99 mg/dL (ref 65–99)

## 2017-06-24 LAB — BPAM FFP
BLOOD PRODUCT EXPIRATION DATE: 201906022359
Blood Product Expiration Date: 201906022359
ISSUE DATE / TIME: 201905281130
ISSUE DATE / TIME: 201905281133
UNIT TYPE AND RH: 6200
Unit Type and Rh: 6200

## 2017-06-24 LAB — POCT I-STAT 3, ART BLOOD GAS (G3+)
ACID-BASE DEFICIT: 3 mmol/L — AB (ref 0.0–2.0)
Bicarbonate: 21.1 mmol/L (ref 20.0–28.0)
O2 Saturation: 97 %
TCO2: 22 mmol/L (ref 22–32)
pCO2 arterial: 32.4 mmHg (ref 32.0–48.0)
pH, Arterial: 7.424 (ref 7.350–7.450)
pO2, Arterial: 87 mmHg (ref 83.0–108.0)

## 2017-06-24 LAB — COAG STUDIES INTERP REPORT

## 2017-06-24 LAB — PREPARE FRESH FROZEN PLASMA
UNIT DIVISION: 0
Unit division: 0

## 2017-06-24 LAB — BASIC METABOLIC PANEL
ANION GAP: 7 (ref 5–15)
BUN: 13 mg/dL (ref 6–20)
CALCIUM: 7.9 mg/dL — AB (ref 8.9–10.3)
CO2: 23 mmol/L (ref 22–32)
Chloride: 106 mmol/L (ref 101–111)
Creatinine, Ser: 1 mg/dL (ref 0.61–1.24)
GFR calc Af Amer: >60 mL/min (ref >60)
GLUCOSE: 112 mg/dL — AB (ref 65–99)
Potassium: 4.4 mmol/L (ref 3.5–5.1)
Sodium: 136 mmol/L (ref 135–145)

## 2017-06-24 LAB — VON WILLEBRAND PANEL
Coagulation Factor VIII: 183 % — ABNORMAL HIGH (ref 57–163)
Ristocetin Co-factor, Plasma: 237 % — ABNORMAL HIGH (ref 50–200)
Von Willebrand Antigen, Plasma: 271 % — ABNORMAL HIGH (ref 50–200)

## 2017-06-24 LAB — POCT I-STAT, CHEM 8
BUN: 14 mg/dL (ref 6–20)
CALCIUM ION: 1.17 mmol/L (ref 1.15–1.40)
Chloride: 101 mmol/L (ref 101–111)
Creatinine, Ser: 0.9 mg/dL (ref 0.61–1.24)
GLUCOSE: 223 mg/dL — AB (ref 65–99)
HCT: 31 % — ABNORMAL LOW (ref 39.0–52.0)
Hemoglobin: 10.5 g/dL — ABNORMAL LOW (ref 13.0–17.0)
Potassium: 4.4 mmol/L (ref 3.5–5.1)
Sodium: 135 mmol/L (ref 135–145)
TCO2: 21 mmol/L — ABNORMAL LOW (ref 22–32)

## 2017-06-24 LAB — CREATININE, SERUM: CREATININE: 1.17 mg/dL (ref 0.61–1.24)

## 2017-06-24 LAB — MAGNESIUM
MAGNESIUM: 1.9 mg/dL (ref 1.7–2.4)
Magnesium: 2.1 mg/dL (ref 1.7–2.4)

## 2017-06-24 MED ORDER — INSULIN ASPART 100 UNIT/ML ~~LOC~~ SOLN
0.0000 [IU] | SUBCUTANEOUS | Status: DC
  Administered 2017-06-24: 8 [IU] via SUBCUTANEOUS
  Administered 2017-06-24 – 2017-06-25 (×2): 4 [IU] via SUBCUTANEOUS
  Administered 2017-06-25: 2 [IU] via SUBCUTANEOUS
  Administered 2017-06-25 (×3): 4 [IU] via SUBCUTANEOUS
  Administered 2017-06-25: 2 [IU] via SUBCUTANEOUS

## 2017-06-24 MED ORDER — INSULIN DETEMIR 100 UNIT/ML ~~LOC~~ SOLN
12.0000 [IU] | Freq: Two times a day (BID) | SUBCUTANEOUS | Status: DC
  Administered 2017-06-24 (×2): 12 [IU] via SUBCUTANEOUS
  Filled 2017-06-24 (×3): qty 0.12

## 2017-06-24 MED ORDER — INSULIN ASPART 100 UNIT/ML ~~LOC~~ SOLN
4.0000 [IU] | Freq: Three times a day (TID) | SUBCUTANEOUS | Status: DC
  Administered 2017-06-24: 4 [IU] via SUBCUTANEOUS

## 2017-06-24 MED ORDER — SODIUM BICARBONATE 8.4 % IV SOLN
50.0000 meq | Freq: Once | INTRAVENOUS | Status: AC
  Administered 2017-06-24: 50 meq via INTRAVENOUS

## 2017-06-24 MED ORDER — FUROSEMIDE 10 MG/ML IJ SOLN
20.0000 mg | Freq: Two times a day (BID) | INTRAMUSCULAR | Status: DC
Start: 2017-06-24 — End: 2017-06-25
  Administered 2017-06-24 (×2): 20 mg via INTRAVENOUS
  Filled 2017-06-24 (×2): qty 2

## 2017-06-24 MED FILL — Sodium Chloride IV Soln 0.9%: INTRAVENOUS | Qty: 2000 | Status: AC

## 2017-06-24 MED FILL — Mannitol IV Soln 20%: INTRAVENOUS | Qty: 500 | Status: AC

## 2017-06-24 MED FILL — Sodium Bicarbonate IV Soln 8.4%: INTRAVENOUS | Qty: 50 | Status: AC

## 2017-06-24 MED FILL — Electrolyte-R (PH 7.4) Solution: INTRAVENOUS | Qty: 4000 | Status: AC

## 2017-06-24 MED FILL — Heparin Sodium (Porcine) Inj 1000 Unit/ML: INTRAMUSCULAR | Qty: 10 | Status: AC

## 2017-06-24 MED FILL — Lidocaine HCl Local Soln Prefilled Syringe 100 MG/5ML (2%): INTRAMUSCULAR | Qty: 5 | Status: AC

## 2017-06-24 NOTE — Progress Notes (Signed)
[facility] BRIEF SICU PROGRESS NOTE  1 Day Post-Op  S/P Procedure(s) (LRB): CORONARY ARTERY BYPASS GRAFTING (CABG) x 3; Using Left Internal Mammary Artery and Right Leg Greater Saphenous Vein harvested endoscopically. (N/A) TRANSESOPHAGEAL ECHOCARDIOGRAM (TEE) (N/A)   Stable day NSR w/ stable BP Breathing comfortably on room air UOP adequate  Plan: Continue current plan  Purcell Nails, MD 06/24/2017 5:33 PM

## 2017-06-24 NOTE — Progress Notes (Signed)
1 Day Post-Op Procedure(s) (LRB): CORONARY ARTERY BYPASS GRAFTING (CABG) x 3; Using Left Internal Mammary Artery and Right Leg Greater Saphenous Vein harvested endoscopically. (N/A) TRANSESOPHAGEAL ECHOCARDIOGRAM (TEE) (N/A) Subjective: Patient anxious with some musculoskeletal discomfort after multivessel CABG for left main stenosis Hemodynamic stable, sinus rhythm Objective: Vital signs in last 24 hours: Temp:  [97.7 F (36.5 C)-100 F (37.8 C)] 97.7 F (36.5 C) (05/29 1609) Pulse Rate:  [71-94] 91 (05/29 1600) Cardiac Rhythm: Normal sinus rhythm (05/29 0800) Resp:  [17-32] 24 (05/29 1600) BP: (103-139)/(59-92) 122/83 (05/29 1500) SpO2:  [96 %-100 %] 99 % (05/29 1600) Arterial Line BP: (111-151)/(49-70) 146/60 (05/29 1500) FiO2 (%):  [40 %-50 %] 40 % (05/28 2001) Weight:  [214 lb 11.2 oz (97.4 kg)] 214 lb 11.2 oz (97.4 kg) (05/29 0600)  Hemodynamic parameters for last 24 hours: PAP: (14-33)/(3-16) 33/7 CO:  [3.8 L/min-5.1 L/min] 5.1 L/min CI:  [1.8 L/min/m2-2.4 L/min/m2] 2.4 L/min/m2  Intake/Output from previous day: 05/28 0701 - 05/29 0700 In: 4815.3 [P.O.:240; I.V.:2955.3; Blood:970; IV Piggyback:650] Out: 4135 [Urine:2865; Blood:650; Chest Tube:620] Intake/Output this shift: Total I/O In: 682.8 [I.V.:682.8] Out: 815 [Urine:380; Chest Tube:435]  Mild edema Breath sounds diminished but clear Abdomen nontender Neuro intact  Lab Results: Recent Labs    06/24/17 0330 06/24/17 1657  WBC 8.2 10.1  HGB 10.2* 10.5*  HCT 29.6* 31.1*  PLT 123* 133*   BMET:  Recent Labs    06/23/17 0451  06/23/17 1953 06/24/17 0330  NA 138   < > 138 136  K 3.9   < > 4.6 4.4  CL 108   < > 107 106  CO2 22  --   --  23  GLUCOSE 118*   < > 133* 112*  BUN 13   < > 11 13  CREATININE 0.99   < > 0.90 1.00  CALCIUM 8.9  --   --  7.9*   < > = values in this interval not displayed.    PT/INR:  Recent Labs    06/23/17 1324  LABPROT 15.8*  INR 1.27   ABG    Component Value  Date/Time   PHART 7.424 06/24/2017 0339   HCO3 21.1 06/24/2017 0339   TCO2 22 06/24/2017 0339   ACIDBASEDEF 3.0 (H) 06/24/2017 0339   O2SAT 97.0 06/24/2017 0339   CBG (last 3)  Recent Labs    06/24/17 1200 06/24/17 1308 06/24/17 1610  GLUCAP 118* 146* 171*    Assessment/Plan: S/P Procedure(s) (LRB): CORONARY ARTERY BYPASS GRAFTING (CABG) x 3; Using Left Internal Mammary Artery and Right Leg Greater Saphenous Vein harvested endoscopically. (N/A) TRANSESOPHAGEAL ECHOCARDIOGRAM (TEE) (N/A) Mobilize See progression orders   LOS: 7 days    Marcus Cummings 06/24/2017

## 2017-06-24 NOTE — Progress Notes (Signed)
Anesthesiology Follow-up:  Awake and alert, neuro intact, sitting in chair, having some incisional soreness. Hemodynamically stable on milrinone  VS: T- 36.6 BP- 128/52 HR- 83 (SR) RR- 25 O2 Sat 98% on RA  PA 33/7 CO/CI: 5.1/2.4   K- 4.4 Na- 136 glucose- 112 H/H- 10.2/29.6 Platelets- 123,000  Extubated 7 hours post-op  53 year old male one day S/P CABG X 3 for unstable angina with preserved LV function. Stable post-op course, no apparent complications.  Kipp Brood

## 2017-06-24 NOTE — Anesthesia Postprocedure Evaluation (Signed)
Anesthesia Post Note  Patient: Marcus GATHRIGHT Sr.  Procedure(s) Performed: CORONARY ARTERY BYPASS GRAFTING (CABG) x 3; Using Left Internal Mammary Artery and Right Leg Greater Saphenous Vein harvested endoscopically. (N/A Chest) TRANSESOPHAGEAL ECHOCARDIOGRAM (TEE) (N/A )     Patient location during evaluation: SICU Anesthesia Type: General Level of consciousness: awake and alert and oriented Pain management: pain level controlled Vital Signs Assessment: post-procedure vital signs reviewed and stable Respiratory status: spontaneous breathing, nonlabored ventilation and respiratory function stable Cardiovascular status: blood pressure returned to baseline Anesthetic complications: no    Last Vitals:  Vitals:   06/24/17 1200 06/24/17 1300  BP: 119/75   Pulse: 83   Resp: (!) 25   Temp:    SpO2: 98% 97%    Last Pain:  Vitals:   06/24/17 1242  TempSrc:   PainSc: 10-Worst pain ever                 Sayda Grable COKER

## 2017-06-24 NOTE — Progress Notes (Addendum)
[facility] DAILY ICU PROGRESS NOTE                   [address]            [place] [address]          [phone]   1 Day Post-Op Procedure(s) (LRB): CORONARY ARTERY BYPASS GRAFTING (CABG) x 3; Using Left Internal Mammary Artery and Right Leg Greater Saphenous Vein harvested endoscopically. (N/A) TRANSESOPHAGEAL ECHOCARDIOGRAM (TEE) (N/A)  Total Length of Stay:  LOS: 7 days   Subjective: No issues overnight. States his pain is an 8/10 but appears to be in and out of sleep.   Objective: Vital signs in last 24 hours: Temp:  [95 F (35 C)-100 F (37.8 C)] 98.6 F (37 C) (05/29 0715) Pulse Rate:  [71-94] 71 (05/29 0715) Cardiac Rhythm: Normal sinus rhythm (05/29 0300) Resp:  [12-32] 18 (05/29 0715) BP: (95-139)/(50-92) 103/67 (05/29 0715) SpO2:  [97 %-100 %] 98 % (05/29 0739) Arterial Line BP: (100-151)/(49-71) 113/51 (05/29 0715) FiO2 (%):  [40 %-50 %] 40 % (05/28 2001) Weight:  [214 lb 11.2 oz (97.4 kg)] 214 lb 11.2 oz (97.4 kg) (05/29 0600)  Filed Weights   06/22/17 0344 06/23/17 0411 06/24/17 0600  Weight: 204 lb 5.9 oz (92.7 kg) 204 lb 4.8 oz (92.7 kg) 214 lb 11.2 oz (97.4 kg)    Weight change: 10 lb 6.4 oz (4.717 kg)   Hemodynamic parameters for last 24 hours: PAP: (14-28)/(3-16) 20/7 CO:  [3.8 L/min-6 L/min] 5.1 L/min CI:  [1.8 L/min/m2-2.4 L/min/m2] 2.4 L/min/m2  Intake/Output from previous day: 05/28 0701 - 05/29 0700 In: 4815.3 [P.O.:240; I.V.:2955.3; Blood:970; IV Piggyback:650] Out: 4135 [Urine:2865; Blood:650; Chest Tube:620]  Intake/Output this shift: No intake/output data recorded.  Current Meds: Scheduled Meds: . acetaminophen  1,000 mg Oral Q6H   Or  . acetaminophen (TYLENOL) oral liquid 160 mg/5 mL  1,000 mg Per Tube Q6H  . aspirin EC  325 mg Oral Daily   Or  . aspirin  324 mg Per Tube Daily  . atorvastatin  80 mg Oral q1800  . bisacodyl  10 mg Oral Daily   Or  . bisacodyl  10 mg Rectal Daily  . Chlorhexidine Gluconate  Cloth  6 each Topical Daily  . docusate sodium  200 mg Oral Daily  . insulin regular  0-10 Units Intravenous TID WC  . levalbuterol  1.25 mg Nebulization Q6H  . metoCLOPramide (REGLAN) injection  10 mg Intravenous Q6H  . metoprolol tartrate  12.5 mg Oral BID   Or  . metoprolol tartrate  12.5 mg Per Tube BID  . mometasone-formoterol  2 puff Inhalation BID  . [START ON 06/25/2017] pantoprazole  40 mg Oral Daily  . sodium chloride flush  10-40 mL Intracatheter Q12H  . sodium chloride flush  3 mL Intravenous Q12H   Continuous Infusions: . sodium chloride 20 mL/hr at 06/23/17 1900  . sodium chloride    . sodium chloride 10 mL/hr at 06/23/17 1900  . albumin human 250 mL (06/23/17 2008)  . cefUROXime (ZINACEF)  IV Stopped (06/24/17 0410)  . dexmedetomidine (PRECEDEX) IV infusion Stopped (06/24/17 0530)  . famotidine (PEPCID) IV    . insulin (NOVOLIN-R) infusion 1.6 Units/hr (06/24/17 0649)  . lactated ringers    . lactated ringers 10 mL/hr at 06/23/17 1900  . lactated ringers 20 mL/hr at 06/23/17 1900  . milrinone 0.3 mcg/kg/min (06/24/17 0400)  . nitroGLYCERIN 30 mcg/min (06/24/17 0400)  . phenylephrine (NEO-SYNEPHRINE)  Adult infusion Stopped (06/23/17 1315)   PRN Meds:.sodium chloride, albumin human, lactated ringers, metoprolol tartrate, midazolam, morphine injection, ondansetron (ZOFRAN) IV, oxyCODONE, sodium chloride flush, sodium chloride flush, traMADol  General appearance: alert, cooperative and no distress Heart: regular rate and rhythm, S1, S2 normal, no murmur, click, rub or gallop Lungs: clear to auscultation bilaterally Abdomen: soft, non-tender; bowel sounds normal; no masses,  no organomegaly Extremities: extremities normal, atraumatic, no cyanosis or edema Wound: clean and dry, covered with sterile dressing  Lab Results: CBC: Recent Labs    06/23/17 1949 06/23/17 1953 06/24/17 0330  WBC 9.2  --  8.2  HGB 11.8* 10.9* 10.2*  HCT 34.7* 32.0* 29.6*  PLT 153  --   123*   BMET:  Recent Labs    06/23/17 0451  06/23/17 1953 06/24/17 0330  NA 138   < > 138 136  K 3.9   < > 4.6 4.4  CL 108   < > 107 106  CO2 22  --   --  23  GLUCOSE 118*   < > 133* 112*  BUN 13   < > 11 13  CREATININE 0.99   < > 0.90 1.00  CALCIUM 8.9  --   --  7.9*   < > = values in this interval not displayed.    CMET: Lab Results  Component Value Date   WBC 8.2 06/24/2017   HGB 10.2 (L) 06/24/2017   HCT 29.6 (L) 06/24/2017   PLT 123 (L) 06/24/2017   GLUCOSE 112 (H) 06/24/2017   CHOL 199 06/19/2017   TRIG 83 06/19/2017   HDL 36 (L) 06/19/2017   LDLCALC 146 (H) 06/19/2017   ALT 50 06/19/2017   AST 39 06/19/2017   NA 136 06/24/2017   K 4.4 06/24/2017   CL 106 06/24/2017   CREATININE 1.00 06/24/2017   BUN 13 06/24/2017   CO2 23 06/24/2017   TSH 2.353 06/19/2017   INR 1.27 06/23/2017   HGBA1C 7.4 (H) 06/17/2017      PT/INR:  Recent Labs    06/23/17 1324  LABPROT 15.8*  INR 1.27   Radiology: Dg Chest Port 1 View  Result Date: 06/23/2017 CLINICAL DATA:  CABG EXAM: PORTABLE CHEST 1 VIEW COMPARISON:  06/17/2017 FINDINGS: Postop CABG. Endotracheal tube in good position. Swan-Ganz catheter in the right main pulmonary artery. NG ina the distal esophagus. Left chest tube in place.  Mediastinal drain in place. Negative for pneumothorax. Small left effusion. Mild bibasilar atelectasis. Negative for edema IMPRESSION: 1. NG in the distal esophagus, recommend advancing into the stomach 2. Other support lines in good position.  No pneumothorax or edema. 3. Mild bibasilar atelectasis and small left pleural effusion. Electronically Signed   By: Marlan Palau M.D.   On: 06/23/2017 14:13     Assessment/Plan: S/P Procedure(s) (LRB): CORONARY ARTERY BYPASS GRAFTING (CABG) x 3; Using Left Internal Mammary Artery and Right Leg Greater Saphenous Vein harvested endoscopically. (N/A) TRANSESOPHAGEAL ECHOCARDIOGRAM (TEE) (N/A)  1. CV-NSR in the 70s. Maps 65-88. Remains on  Milrinone and Nitro.  2. Pulm-CXR this morning without pleural effusions. He is tolerating room air with good oxygen saturation.Chest tubes put out 620cc/24 hours-keep.  3. Renal-creatinine is 1.00 this morning. Electrolytes okay. Mag 2.1 and potassium 4.4.  4. H and H- 10.2/29.6, platelets are 123k 5. Endo-blood glucose level has been well controlled.  Plan: Restart BP meds when taking in more oral. Discontinue swan ganz catheter. Keep a-line for now. OOB to chair. Work on pain  control. Keep chest tubes due to high output.     Sharlene Dory 06/24/2017 7:53 AM    Agree with above plan - leave chest tubes and DC swan, mobilize. Lasix bid and transition off iv insulin

## 2017-06-25 ENCOUNTER — Inpatient Hospital Stay (HOSPITAL_COMMUNITY): Payer: Self-pay

## 2017-06-25 LAB — CBC
HCT: 29 % — ABNORMAL LOW (ref 39.0–52.0)
Hemoglobin: 9.7 g/dL — ABNORMAL LOW (ref 13.0–17.0)
MCH: 30.6 pg (ref 26.0–34.0)
MCHC: 33.4 g/dL (ref 30.0–36.0)
MCV: 91.5 fL (ref 78.0–100.0)
Platelets: 133 10*3/uL — ABNORMAL LOW (ref 150–400)
RBC: 3.17 MIL/uL — ABNORMAL LOW (ref 4.22–5.81)
RDW: 14.9 % (ref 11.5–15.5)
WBC: 10.5 10*3/uL (ref 4.0–10.5)

## 2017-06-25 LAB — GLUCOSE, CAPILLARY
GLUCOSE-CAPILLARY: 172 mg/dL — AB (ref 65–99)
Glucose-Capillary: 132 mg/dL — ABNORMAL HIGH (ref 65–99)
Glucose-Capillary: 147 mg/dL — ABNORMAL HIGH (ref 65–99)
Glucose-Capillary: 172 mg/dL — ABNORMAL HIGH (ref 65–99)
Glucose-Capillary: 173 mg/dL — ABNORMAL HIGH (ref 65–99)
Glucose-Capillary: 184 mg/dL — ABNORMAL HIGH (ref 65–99)
Glucose-Capillary: 87 mg/dL (ref 65–99)

## 2017-06-25 LAB — POCT I-STAT, CHEM 8
BUN: 10 mg/dL (ref 6–20)
Calcium, Ion: 0.99 mmol/L — ABNORMAL LOW (ref 1.15–1.40)
Chloride: 107 mmol/L (ref 101–111)
Creatinine, Ser: 0.6 mg/dL — ABNORMAL LOW (ref 0.61–1.24)
Glucose, Bld: 108 mg/dL — ABNORMAL HIGH (ref 65–99)
HCT: 24 % — ABNORMAL LOW (ref 39.0–52.0)
Hemoglobin: 8.2 g/dL — ABNORMAL LOW (ref 13.0–17.0)
Potassium: 2.9 mmol/L — ABNORMAL LOW (ref 3.5–5.1)
SODIUM: 144 mmol/L (ref 135–145)
TCO2: 18 mmol/L — AB (ref 22–32)

## 2017-06-25 LAB — BASIC METABOLIC PANEL
Anion gap: 8 (ref 5–15)
BUN: 12 mg/dL (ref 6–20)
CO2: 25 mmol/L (ref 22–32)
Calcium: 8.1 mg/dL — ABNORMAL LOW (ref 8.9–10.3)
Chloride: 103 mmol/L (ref 101–111)
Creatinine, Ser: 1.04 mg/dL (ref 0.61–1.24)
GFR calc Af Amer: >60 mL/min (ref >60)
GFR calc non Af Amer: >60 mL/min (ref >60)
Glucose, Bld: 168 mg/dL — ABNORMAL HIGH (ref 65–99)
Potassium: 4.2 mmol/L (ref 3.5–5.1)
Sodium: 136 mmol/L (ref 135–145)

## 2017-06-25 LAB — COOXEMETRY PANEL
Carboxyhemoglobin: 1.7 % — ABNORMAL HIGH (ref 0.5–1.5)
Methemoglobin: 1 % (ref 0.0–1.5)
O2 Saturation: 67.5 %
Total hemoglobin: 10.7 g/dL — ABNORMAL LOW (ref 12.0–16.0)

## 2017-06-25 MED ORDER — FUROSEMIDE 10 MG/ML IJ SOLN
40.0000 mg | Freq: Two times a day (BID) | INTRAMUSCULAR | Status: DC
  Administered 2017-06-25 (×2): 40 mg via INTRAVENOUS
  Filled 2017-06-25 (×2): qty 4

## 2017-06-25 MED ORDER — INSULIN DETEMIR 100 UNIT/ML ~~LOC~~ SOLN
15.0000 [IU] | Freq: Two times a day (BID) | SUBCUTANEOUS | Status: DC
  Administered 2017-06-25 – 2017-07-03 (×15): 15 [IU] via SUBCUTANEOUS
  Filled 2017-06-25 (×20): qty 0.15

## 2017-06-25 MED ORDER — POTASSIUM CHLORIDE CRYS ER 20 MEQ PO TBCR: 40.0000 meq | EXTENDED_RELEASE_TABLET | Freq: Once | ORAL | Status: AC

## 2017-06-25 MED ORDER — POTASSIUM CHLORIDE CRYS ER 20 MEQ PO TBCR
40.0000 meq | EXTENDED_RELEASE_TABLET | Freq: Once | ORAL | Status: AC
  Administered 2017-06-25: 40 meq via ORAL
  Filled 2017-06-25: qty 2

## 2017-06-25 MED ORDER — POTASSIUM CHLORIDE CRYS ER 20 MEQ PO TBCR
40.0000 meq | EXTENDED_RELEASE_TABLET | Freq: Once | ORAL | Status: AC
Start: 2017-06-25 — End: 2017-06-25
  Administered 2017-06-25: 40 meq via ORAL
  Filled 2017-06-25: qty 2

## 2017-06-25 MED ORDER — FUROSEMIDE 10 MG/ML IJ SOLN: 40.0000 mg | Freq: Two times a day (BID) | INTRAMUSCULAR | Status: DC

## 2017-06-25 MED ORDER — INSULIN ASPART 100 UNIT/ML ~~LOC~~ SOLN
5.0000 [IU] | Freq: Three times a day (TID) | SUBCUTANEOUS | Status: DC
  Administered 2017-06-25 – 2017-06-30 (×12): 5 [IU] via SUBCUTANEOUS

## 2017-06-25 MED ORDER — POTASSIUM CHLORIDE CRYS ER 20 MEQ PO TBCR: 20.0000 meq | EXTENDED_RELEASE_TABLET | Freq: Two times a day (BID) | ORAL | Status: DC

## 2017-06-25 MED FILL — Magnesium Sulfate Inj 50%: INTRAMUSCULAR | Qty: 10 | Status: AC

## 2017-06-25 MED FILL — Heparin Sodium (Porcine) Inj 1000 Unit/ML: INTRAMUSCULAR | Qty: 30 | Status: AC

## 2017-06-25 MED FILL — Potassium Chloride Inj 2 mEq/ML: INTRAVENOUS | Qty: 40 | Status: AC

## 2017-06-25 NOTE — Progress Notes (Signed)
Patient ID: Marcus CAMMACK Sr., male   DOB: 1965/12/08, 52 y.o.   MRN: [id number] [facility] Evening Rounds:  Hemodynamically stable in sinus rhythm. sats 99% Urine output good.  BMET    Component Value Date/Time   NA 144 06/25/2017 1716   K 2.9 (L) 06/25/2017 1716   CL 107 06/25/2017 1716   CO2 25 06/25/2017 0403   GLUCOSE 108 (H) 06/25/2017 1716   BUN 10 06/25/2017 1716   CREATININE 0.60 (L) 06/25/2017 1716   CALCIUM 8.1 (L) 06/25/2017 0403   GFRNONAA >60 06/25/2017 0403   GFRAA >60 06/25/2017 0403   CBC    Component Value Date/Time   WBC 10.5 06/25/2017 0403   RBC 3.17 (L) 06/25/2017 0403   HGB 8.2 (L) 06/25/2017 1716   HCT 24.0 (L) 06/25/2017 1716   PLT 133 (L) 06/25/2017 0403   MCV 91.5 06/25/2017 0403   MCH 30.6 06/25/2017 0403   MCHC 33.4 06/25/2017 0403   RDW 14.9 06/25/2017 0403     Replacing K+

## 2017-06-25 NOTE — Progress Notes (Addendum)
T[facility] DAILY ICU PROGRESS NOTE                   [address]            [place] [address]          [phone]   2 Days Post-Op Procedure(s) (LRB): CORONARY ARTERY BYPASS GRAFTING (CABG) x 3; Using Left Internal Mammary Artery and Right Leg Greater Saphenous Vein harvested endoscopically. (N/A) TRANSESOPHAGEAL ECHOCARDIOGRAM (TEE) (N/A)  Total Length of Stay:  LOS: 8 days   Subjective: Feels okay. States that he still has incisional pain but the pain medication is helping.   Objective: Vital signs in last 24 hours: Temp:  [97.7 F (36.5 C)-98.4 F (36.9 C)] 98 F (36.7 C) (05/30 0331) Pulse Rate:  [75-110] 99 (05/30 0600) Cardiac Rhythm: Normal sinus rhythm (05/30 0400) Resp:  [17-29] 24 (05/30 0600) BP: (107-164)/(59-83) 124/73 (05/30 0600) SpO2:  [96 %-100 %] 97 % (05/30 0600) Arterial Line BP: (116-146)/(49-60) 146/60 (05/29 1500) Weight:  [212 lb 11.9 oz (96.5 kg)] 212 lb 11.9 oz (96.5 kg) (05/30 0530)  Filed Weights   06/23/17 0411 06/24/17 0600 06/25/17 0530  Weight: 204 lb 4.8 oz (92.7 kg) 214 lb 11.2 oz (97.4 kg) 212 lb 11.9 oz (96.5 kg)    Weight change: -1 lb 15.3 oz (-0.887 kg)   Hemodynamic parameters for last 24 hours: PAP: (19-33)/(5-7) 33/7 CO:  [5.1 L/min] 5.1 L/min CI:  [2.4 L/min/m2] 2.4 L/min/m2  Intake/Output from previous day: 05/29 0701 - 05/30 0700 In: 1011.8 [I.V.:1011.8] Out: 2605 [Urine:2100; Chest Tube:505]  Intake/Output this shift: No intake/output data recorded.  Current Meds: Scheduled Meds: . acetaminophen  1,000 mg Oral Q6H   Or  . acetaminophen (TYLENOL) oral liquid 160 mg/5 mL  1,000 mg Per Tube Q6H  . aspirin EC  325 mg Oral Daily   Or  . aspirin  324 mg Per Tube Daily  . atorvastatin  80 mg Oral q1800  . bisacodyl  10 mg Oral Daily   Or  . bisacodyl  10 mg Rectal Daily  . Chlorhexidine Gluconate Cloth  6 each Topical Daily  . docusate sodium  200 mg Oral Daily  . furosemide  20 mg Intravenous BID    . insulin aspart  0-24 Units Subcutaneous Q4H  . insulin aspart  4 Units Subcutaneous TID WC  . insulin detemir  12 Units Subcutaneous BID  . levalbuterol  1.25 mg Nebulization Q6H  . metoCLOPramide (REGLAN) injection  10 mg Intravenous Q6H  . metoprolol tartrate  12.5 mg Oral BID   Or  . metoprolol tartrate  12.5 mg Per Tube BID  . mometasone-formoterol  2 puff Inhalation BID  . pantoprazole  40 mg Oral Daily  . sodium chloride flush  10-40 mL Intracatheter Q12H  . sodium chloride flush  3 mL Intravenous Q12H   Continuous Infusions: . sodium chloride Stopped (06/24/17 1114)  . sodium chloride    . sodium chloride Stopped (06/24/17 1116)  . dexmedetomidine (PRECEDEX) IV infusion Stopped (06/24/17 0530)  . insulin (NOVOLIN-R) infusion Stopped (06/24/17 1600)  . lactated ringers    . lactated ringers Stopped (06/24/17 1115)  . lactated ringers 20 mL/hr at 06/24/17 1900  . milrinone 0.125 mcg/kg/min (06/24/17 1900)  . nitroGLYCERIN 30 mcg/min (06/24/17 1300)  . phenylephrine (NEO-SYNEPHRINE) Adult infusion Stopped (06/23/17 1315)   PRN Meds:.sodium chloride, lactated ringers, metoprolol tartrate, midazolam, morphine injection, ondansetron (ZOFRAN) IV, oxyCODONE, sodium chloride flush, sodium chloride flush,  traMADol  General appearance: alert, cooperative and no distress Heart: sinus tachycardia Lungs: clear to auscultation bilaterally Abdomen: soft, non-tender; bowel sounds normal; no masses,  no organomegaly Extremities: extremities normal, atraumatic, no cyanosis or edema Wound: clean and dry covered with a sterile dressing  Lab Results: CBC: Recent Labs    06/24/17 1657 06/24/17 1704 06/25/17 0403  WBC 10.1  --  10.5  HGB 10.5* 10.5* 9.7*  HCT 31.1* 31.0* 29.0*  PLT 133*  --  133*   BMET:  Recent Labs    06/24/17 0330  06/24/17 1704 06/25/17 0403  NA 136  --  135 136  K 4.4  --  4.4 4.2  CL 106  --  101 103  CO2 23  --   --  25  GLUCOSE 112*  --  223* 168*   BUN 13  --  14 12  CREATININE 1.00   < > 0.90 1.04  CALCIUM 7.9*  --   --  8.1*   < > = values in this interval not displayed.    CMET: Lab Results  Component Value Date   WBC 10.5 06/25/2017   HGB 9.7 (L) 06/25/2017   HCT 29.0 (L) 06/25/2017   PLT 133 (L) 06/25/2017   GLUCOSE 168 (H) 06/25/2017   CHOL 199 06/19/2017   TRIG 83 06/19/2017   HDL 36 (L) 06/19/2017   LDLCALC 146 (H) 06/19/2017   ALT 50 06/19/2017   AST 39 06/19/2017   NA 136 06/25/2017   K 4.2 06/25/2017   CL 103 06/25/2017   CREATININE 1.04 06/25/2017   BUN 12 06/25/2017   CO2 25 06/25/2017   TSH 2.353 06/19/2017   INR 1.27 06/23/2017   HGBA1C 7.4 (H) 06/17/2017      PT/INR:  Recent Labs    06/23/17 1324  LABPROT 15.8*  INR 1.27   Radiology: No results found.   Assessment/Plan: S/P Procedure(s) (LRB): CORONARY ARTERY BYPASS GRAFTING (CABG) x 3; Using Left Internal Mammary Artery and Right Leg Greater Saphenous Vein harvested endoscopically. (N/A) TRANSESOPHAGEAL ECHOCARDIOGRAM (TEE) (N/A)  1. CV-sinus tachycardia, rate low 100s. Maps 70-90. Remains on Milrinone but off nitro. coox is 67.5. On Lopressor 12.5mg  BID. Will increase once off milrinone.  2. Pulm-CXR this morning without pleural effusions. He is tolerating room air with good oxygen saturation.Chest tubes put out 505cc/24 hours-keep.  3. Renal-creatinine is 1.04 this morning. Electrolytes okay. Mag 2.1 and potassium 4.2. Making good urine. Continue Lasix  BID for fluid overload.  4. H and H- 9.7/29.0, platelets are 133k 5. Endo-blood glucose level has been well controlled.   Plan: Can probably wean off milrinone since his coox this morning is 67.5. Keep chest tubes. Continue diuretics for fluid overload. Ambulate in the halls today. Work on pain control.     Sharlene Dory 06/25/2017 7:47 AM    patient examined and medical record reviewed,agree with above note.             DC milrinone, chest tubes and cont diuresis              Ambulate             Patient will need SNF since he lives in a group home for [employer] Trigt III 06/25/2017

## 2017-06-25 NOTE — Discharge Summary (Addendum)
Physician Discharge Summary  Patient ID: Marcus BOSTWICK Sr. MRN: [id number] DOB/AGE: 50-Sep-1967 50 y.o.  Admit date: 06/17/2017 Discharge date: 07/03/2017  Admission Diagnoses: Patient Active Problem List   Diagnosis Date Noted  . Essential hypertension   . Dyslipidemia   . Chest pain on exertion   . Stable angina (HCC) 06/17/2017    Discharge Diagnoses:  Active Problems:   Stable angina (HCC)   Chest pain on exertion   Essential hypertension   Dyslipidemia   S/P CABG x 3   Discharged Condition: good  HPI:   52 year old diabetic smoker AA admitted with symptoms of unstable angina.  Patient had PCI at the [facility] 2008.  He has had exertional angina for the past 2 weeks of increasing frequency and intensity.  He was seen at a [place] clinic locally and sent to the [facility] ED.  EKG showed nonspecific changes and troponin was negative.  Patient was admitted and underwent cardiac catheterization which shows EF 55%, new left main stenosis with moderate RCA stenosis.  LVEDP 10 mmHg.  Patient is currently pain-free on return.  Patient has been on chronic Plavix following a stroke last year.  Etiology of stroke is unclear-no specific history of A. fib or carotid disease.  Patient currently lives in a group Home for veterans.  Patient has had previous psych hospitalization for depression.     He has a history of 2 prior stents in 2007 placed at [facility] (records pending). Following his stents, he took plavix. 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 He was subsequently taken off his plavix and had a stroke in 2017 and he was forced to stop driving a truck. He became homeless for a time and is now living in a group home for veterans. He receives 3 meals per day at this home, but it sounds like the diet is not heart healthy. He is working on getting a Community education officer for an apartment. He spent 30 days at the [facility] psychiatric unit for mood stability issues and has been compliant on all  meds since. He smokes 1/2 ppd for the past 20 years. He has a significant family history of heart disease. His mother had a MI and died in her 67s (she was ESRD on HD) and his father had congestive heart failure.   Hospital Course:  Mr. Marcus Cummings underwent a coronary bypass grafting x3 on 06/23/2017 with Dr. Maren Cummings.  He tolerated the procedure well and was transferred to the surgical ICU in stable condition.  He was extubated in a timely manner.  During his stay in the SICU the patient was weaned off Milrinone and NTG drip as hemodynamics allowed.  His chest tubes and arterial lines were removed without difficulty.  He was started on diuretics for volume overload with good response.  He was maintaining NSR and was transferred to the telemetry unit on 06/26/2017.  He continued to make progress.  He developed multiple PVCs and his beta blocker was titrated accordingly.  His pacing wires were removed without difficulty prior to discharge.  His Hgb A1c was 7.4 preoperatively. He was counseled on importance of good glucose control and was transitioned to home medications prior to discharge.  He continues to ambulate without issues.  He was living in a group home prior to admission.  We are working with social work to get discharge placement arranged.  His pain is well controlled.  He is tolerating a diet.  The preceding was dictated by Marcus Crane PA-C. In addition, patient needs to be restarted on Paliperidone and Naltrexone as taken prior to surgery. As discussed with Dr. Donata Clay, patient is surgically stable for discharge to SNF today.  Consults: None  Significant Diagnostic Studies:   CLINICAL DATA:  52 year old male postoperative day 2 status post CABG.  EXAM: PORTABLE CHEST 1 VIEW  COMPARISON:  06/24/2017 and earlier.  FINDINGS: Seated upright AP portable view at 0605 hours. Swan-Ganz catheter removed, right IJ introducer sheath remains. Left chest tube remains. Mediastinal tube remains.  Stable lung volumes and mediastinal contour. No pneumothorax or pulmonary edema. Mild bibasilar opacity most resembling atelectasis. Negative visible bowel gas pattern.  IMPRESSION: 1. Interval removal of Swan-Ganz catheter with otherwise stable lines and tubes. 2. No pneumothorax or pulmonary edema. Stable lung volumes with atelectasis.   Electronically Signed   By: Marcus Cummings M.D.   On: 06/25/2017 08:42  Treatments:  NAME: Trachtenberg SR., Marcus L. MEDICAL RECORD ZO:[id number] ACCOUNT [id number] DATE OF BIRTH:1965/10/26 FACILITY: [facility] LOCATION: [facility] PHYSICIAN:Marcus Griep VAN TRIGT III, MD  OPERATIVE REPORT  DATE OF PROCEDURE:  06/23/2017  OPERATION: 1.  Coronary artery bypass grafting x3 (left internal mammary artery to left anterior descending, saphenous vein graft to posterior descending, saphenous vein graft to obtuse marginal. 2.  Endoscopic harvest of right leg greater saphenous vein.  SURGEON:  Marcus Cummings, M.D.  ASSISTANT:  Marcus Favre, PA-C.  POSTOPERATIVE DIAGNOSES:  Non-ST elevation myocardial  infarction, severe left main and 3-vessel coronary artery disease.  ANESTHESIA:  General by Marcus Brood, MD.   Discharge Exam: Blood pressure 118/79, pulse 81, temperature 98.2 F (36.8 C), temperature source Oral, resp. rate 20, height 6' (1.829 m), weight 189 lb 9.5 oz (86 kg), SpO2 100 %.    General appearance: alert, cooperative and no distress Heart: regular rate and rhythm Lungs: min dim in bases Abdomen: benign Extremities: edema improved Wound: incis healing well    Disposition:   Discharge Instructions    Amb Referral to Cardiac Rehabilitation   Complete by:  As directed    Diagnosis:  CABG   CABG X ___:  3     Allergies as of 07/03/2017   No Known Allergies     Medication List    STOP taking these medications   clopidogrel 75 MG tablet Commonly known as:  PLAVIX   enalapril 10 MG tablet Commonly known as:  VASOTEC    naproxen sodium 220 MG tablet Commonly known as:  ALEVE     TAKE these medications   aspirin 325 MG EC tablet Take 1 tablet (325 mg total) by mouth daily.   atorvastatin 80 MG tablet Commonly known as:  LIPITOR Take 1 tablet (80 mg total) by mouth daily at 6 PM. What changed:    medication strength  how much to take   furosemide 40 MG tablet Commonly known as:  LASIX Take 1 tablet (40 mg total) by mouth daily.   glipiZIDE 5 MG tablet Commonly known as:  GLUCOTROL Take 5 mg by mouth daily before breakfast.   insulin glargine 100 UNIT/ML injection Commonly known as:  LANTUS Inject 30 Units into the skin at bedtime.   Melatonin 3 MG Caps Take 6 mg by mouth at bedtime as needed (for sleep).   metFORMIN 1000 MG tablet Commonly known as:  GLUCOPHAGE Take 1,000 mg by mouth 2 (two) times daily with a meal.   metoprolol tartrate 50 MG tablet Commonly known as:  LOPRESSOR Take 1 tablet (50 mg total) by mouth 2 (two) times daily.   Naltrexone 380 MG Susr Inject 380 mg into the muscle every 28 (twenty-eight) days.   oxyCODONE 5 MG immediate release tablet Commonly known as:  Oxy IR/ROXICODONE Take 1-2 tablets (5-10 mg total) by mouth every 6 (six) hours as needed for up to 7 days for severe pain.   paliperidone 156 MG/ML Susy injection Commonly known as:  INVEGA SUSTENNA Inject 156 mg into the muscle every 28 (twenty-eight) days.   Potassium Chloride ER 20 MEQ Tbcr Take 20 mEq by mouth daily.      Follow-up Information    Kerin Perna, MD Follow up.   Specialty:  Cardiothoracic Surgery Why:  Your follow-up appointment is on July 29, 2017 at 10:30 AM.  Please arrive at 10 AM for a chest x-ray located at [facility] imaging which is on the first floor of our building. Contact information: [address] [place] [address] [phone]        Clinic, [facility] Call in 1 day(s).   Why:  for a follow up appointment regarding further  diabetes treatment and surveillance of HGA1C Contact information: [address] [facility] [place] [address] [phone]        Abelino Derrick, PA-C Follow up.   Specialties:  Cardiology, Radiology Why:  Corine Shelter, PA-C 6/17  ([facility])  Contact information: [address] [place] [address] [phone]          The patient has been discharged on:   1.Beta Blocker:  Yes [ Y  ]                              No   [   ]                              If No, reason:  2.Ace Inhibitor/ARB: Yes [   ]                                     No  [ n   ]                                     If No, reason:low BP  3.Statin:   Yes [ Y  ]                  No  [   ]                  If No, reason:  4.Ecasa:  Yes  [ Y  ]                  No   [   ]                  If No, reason:    Signed: Elenore Rota 07/03/2017, 4:22 PM   patient examined and medical record reviewed,agree with above note. Kathlee Nations Trigt Cummings 07/03/2017

## 2017-06-26 ENCOUNTER — Inpatient Hospital Stay (HOSPITAL_COMMUNITY): Payer: Self-pay

## 2017-06-26 LAB — BPAM RBC
Blood Product Expiration Date: 201906012359
Blood Product Expiration Date: 201906092359
Blood Product Expiration Date: 201906092359
ISSUE DATE / TIME: 201905230717
ISSUE DATE / TIME: 201905230717
ISSUE DATE / TIME: 201905281258
Unit Type and Rh: 6200
Unit Type and Rh: 6200
Unit Type and Rh: 6200

## 2017-06-26 LAB — TYPE AND SCREEN
ABO/RH(D): A POS
Antibody Screen: NEGATIVE
Unit division: 0
Unit division: 0
Unit division: 0

## 2017-06-26 LAB — GLUCOSE, CAPILLARY
GLUCOSE-CAPILLARY: 104 mg/dL — AB (ref 65–99)
GLUCOSE-CAPILLARY: 112 mg/dL — AB (ref 65–99)
GLUCOSE-CAPILLARY: 153 mg/dL — AB (ref 65–99)
GLUCOSE-CAPILLARY: 158 mg/dL — AB (ref 65–99)
Glucose-Capillary: 169 mg/dL — ABNORMAL HIGH (ref 65–99)

## 2017-06-26 LAB — CBC
HCT: 27.9 % — ABNORMAL LOW (ref 39.0–52.0)
Hemoglobin: 9.3 g/dL — ABNORMAL LOW (ref 13.0–17.0)
MCH: 30.2 pg (ref 26.0–34.0)
MCHC: 33.3 g/dL (ref 30.0–36.0)
MCV: 90.6 fL (ref 78.0–100.0)
Platelets: 137 10*3/uL — ABNORMAL LOW (ref 150–400)
RBC: 3.08 MIL/uL — ABNORMAL LOW (ref 4.22–5.81)
RDW: 14.9 % (ref 11.5–15.5)
WBC: 9.7 10*3/uL (ref 4.0–10.5)

## 2017-06-26 LAB — BASIC METABOLIC PANEL
Anion gap: 6 (ref 5–15)
BUN: 16 mg/dL (ref 6–20)
CO2: 23 mmol/L (ref 22–32)
Calcium: 8.1 mg/dL — ABNORMAL LOW (ref 8.9–10.3)
Chloride: 108 mmol/L (ref 101–111)
Creatinine, Ser: 1.04 mg/dL (ref 0.61–1.24)
GFR calc Af Amer: >60 mL/min (ref >60)
GFR calc non Af Amer: >60 mL/min (ref >60)
Glucose, Bld: 105 mg/dL — ABNORMAL HIGH (ref 65–99)
Potassium: 4.5 mmol/L (ref 3.5–5.1)
Sodium: 137 mmol/L (ref 135–145)

## 2017-06-26 MED ORDER — FUROSEMIDE 40 MG PO TABS
40.0000 mg | ORAL_TABLET | Freq: Every day | ORAL | Status: DC
  Administered 2017-06-26 – 2017-07-02 (×7): 40 mg via ORAL
  Filled 2017-06-26 (×7): qty 1

## 2017-06-26 MED ORDER — INSULIN ASPART 100 UNIT/ML ~~LOC~~ SOLN
0.0000 [IU] | Freq: Three times a day (TID) | SUBCUTANEOUS | Status: DC
  Administered 2017-06-26: 4 [IU] via SUBCUTANEOUS
  Administered 2017-06-26 (×2): 2 [IU] via SUBCUTANEOUS
  Administered 2017-06-27: 4 [IU] via SUBCUTANEOUS
  Administered 2017-06-27: 2 [IU] via SUBCUTANEOUS
  Administered 2017-06-27 (×2): 4 [IU] via SUBCUTANEOUS
  Administered 2017-06-28: 8 [IU] via SUBCUTANEOUS
  Administered 2017-06-29: 2 [IU] via SUBCUTANEOUS
  Administered 2017-06-29: 8 [IU] via SUBCUTANEOUS
  Administered 2017-07-01 – 2017-07-02 (×2): 4 [IU] via SUBCUTANEOUS

## 2017-06-26 MED ORDER — SODIUM CHLORIDE 0.9 % IV SOLN: 250.0000 mL | INTRAVENOUS | Status: DC | PRN

## 2017-06-26 MED ORDER — METOPROLOL TARTRATE 25 MG PO TABS
25.0000 mg | ORAL_TABLET | Freq: Two times a day (BID) | ORAL | Status: DC
  Administered 2017-06-26 (×2): 25 mg via ORAL
  Filled 2017-06-26 (×2): qty 1

## 2017-06-26 MED ORDER — CLONAZEPAM 1 MG PO TABS
1.0000 mg | ORAL_TABLET | Freq: Every day | ORAL | Status: DC
  Administered 2017-06-26 – 2017-07-02 (×7): 1 mg via ORAL
  Filled 2017-06-26 (×7): qty 1

## 2017-06-26 MED ORDER — MAGNESIUM HYDROXIDE 400 MG/5ML PO SUSP: 30.0000 mL | Freq: Every day | ORAL | Status: DC | PRN

## 2017-06-26 MED ORDER — SODIUM CHLORIDE 0.9% FLUSH
3.0000 mL | Freq: Two times a day (BID) | INTRAVENOUS | Status: DC
  Administered 2017-06-26 – 2017-07-02 (×11): 3 mL via INTRAVENOUS

## 2017-06-26 MED ORDER — SODIUM CHLORIDE 0.9% FLUSH: 3.0000 mL | INTRAVENOUS | Status: DC | PRN

## 2017-06-26 MED ORDER — POTASSIUM CHLORIDE CRYS ER 20 MEQ PO TBCR
20.0000 meq | EXTENDED_RELEASE_TABLET | Freq: Every day | ORAL | Status: DC
  Administered 2017-06-26 – 2017-07-02 (×7): 20 meq via ORAL
  Filled 2017-06-26 (×7): qty 1

## 2017-06-26 MED ORDER — MOVING RIGHT ALONG BOOK
Freq: Once | Status: AC
  Administered 2017-06-26: 10:00:00
  Filled 2017-06-26: qty 1

## 2017-06-26 NOTE — Progress Notes (Signed)
06/26/2017 1730 Received pt to room 4E-06 from 2H.  Pt is A&O, c/o post-op chest pain, will medicate.  Tele monitor applied and CCMD notified.  Oriented to room, call light and bed.  Call bell In reach. Kathryne Hitch

## 2017-06-26 NOTE — Progress Notes (Addendum)
3 Days Post-Op Procedure(s) (LRB): CORONARY ARTERY BYPASS GRAFTING (CABG) x 3; Using Left Internal Mammary Artery and Right Leg Greater Saphenous Vein harvested endoscopically. (N/A) TRANSESOPHAGEAL ECHOCARDIOGRAM (TEE) (N/A) Subjective: Progressing after CABG Ready for tx to 4E Acute postop blood loss anemia- improved with diuresis Hb 9.2 Objective: Vital signs in last 24 hours: Temp:  [98.1 F (36.7 C)-99.6 F (37.6 C)] 98.1 F (36.7 C) (05/31 0729) Pulse Rate:  [56-103] 56 (05/31 0700) Cardiac Rhythm: Normal sinus rhythm (05/31 0400) Resp:  [18-24] 23 (05/31 0700) BP: (108-142)/(61-95) 109/63 (05/31 0729) SpO2:  [96 %-100 %] 100 % (05/31 0729) Weight:  [206 lb 5.6 oz (93.6 kg)] 206 lb 5.6 oz (93.6 kg) (05/31 0600)  Hemodynamic parameters for last 24 hours:  nsr  Intake/Output from previous day: 05/30 0701 - 05/31 0700 In: 847 [P.O.:800; I.V.:47] Out: 2560 [Urine:2530; Chest Tube:30] Intake/Output this shift: No intake/output data recorded.       Exam    General- alert and comfortable    Neck- no JVD, no cervical adenopathy palpable, no carotid bruit   Lungs- clear without rales, wheezes   Cor- regular rate and rhythm, no murmur , gallop   Abdomen- soft, non-tender   Extremities - warm, non-tender, minimal edema   Neuro- oriented, appropriate, no focal weakness    Lab Results: Recent Labs    06/25/17 0403 06/25/17 1716 06/26/17 0338  WBC 10.5  --  9.7  HGB 9.7* 8.2* 9.3*  HCT 29.0* 24.0* 27.9*  PLT 133*  --  137*   BMET:  Recent Labs    06/25/17 0403 06/25/17 1716 06/26/17 0338  NA 136 144 137  K 4.2 2.9* 4.5  CL 103 107 108  CO2 25  --  23  GLUCOSE 168* 108* 105*  BUN CREATININE 1.04 0.60* 1.04  CALCIUM 8.1*  --  8.1*    PT/INR:  Recent Labs    06/23/17 1324  LABPROT 15.8*  INR 1.27   ABG    Component Value Date/Time   PHART 7.424 06/24/2017 0339   HCO3 21.1 06/24/2017 0339   TCO2 18 (L) 06/25/2017 1716   ACIDBASEDEF 3.0  (H) 06/24/2017 0339   O2SAT 67.5 06/25/2017 0400   CBG (last 3)  Recent Labs    06/25/17 2329 06/26/17 0333 06/26/17 0738  GLUCAP 87 104* 112*    Assessment/Plan: S/P Procedure(s) (LRB): CORONARY ARTERY BYPASS GRAFTING (CABG) x 3; Using Left Internal Mammary Artery and Right Leg Greater Saphenous Vein harvested endoscopically. (N/A) TRANSESOPHAGEAL ECHOCARDIOGRAM (TEE) (N/A) Mobilize Diuresis Diabetes control Plan for transfer to step-down: see transfer orders LSW consult for SNF   LOS: 9 days    Kathlee Nations Trigt III 06/26/2017

## 2017-06-27 ENCOUNTER — Inpatient Hospital Stay (HOSPITAL_COMMUNITY): Payer: Self-pay

## 2017-06-27 LAB — BASIC METABOLIC PANEL
Anion gap: 8 (ref 5–15)
BUN: 15 mg/dL (ref 6–20)
CO2: 25 mmol/L (ref 22–32)
Calcium: 8.5 mg/dL — ABNORMAL LOW (ref 8.9–10.3)
Chloride: 102 mmol/L (ref 101–111)
Creatinine, Ser: 0.96 mg/dL (ref 0.61–1.24)
GFR calc Af Amer: >60 mL/min (ref >60)
GFR calc non Af Amer: >60 mL/min (ref >60)
Glucose, Bld: 143 mg/dL — ABNORMAL HIGH (ref 65–99)
Potassium: 4 mmol/L (ref 3.5–5.1)
Sodium: 135 mmol/L (ref 135–145)

## 2017-06-27 LAB — CBC
HCT: 27.3 % — ABNORMAL LOW (ref 39.0–52.0)
Hemoglobin: 9.1 g/dL — ABNORMAL LOW (ref 13.0–17.0)
MCH: 31 pg (ref 26.0–34.0)
MCHC: 33.3 g/dL (ref 30.0–36.0)
MCV: 92.9 fL (ref 78.0–100.0)
Platelets: 180 10*3/uL (ref 150–400)
RBC: 2.94 MIL/uL — ABNORMAL LOW (ref 4.22–5.81)
RDW: 14.8 % (ref 11.5–15.5)
WBC: 7.9 10*3/uL (ref 4.0–10.5)

## 2017-06-27 LAB — GLUCOSE, CAPILLARY
GLUCOSE-CAPILLARY: 206 mg/dL — AB (ref 65–99)
GLUCOSE-CAPILLARY: 164 mg/dL — AB (ref 65–99)
Glucose-Capillary: 144 mg/dL — ABNORMAL HIGH (ref 65–99)
Glucose-Capillary: 172 mg/dL — ABNORMAL HIGH (ref 65–99)

## 2017-06-27 MED ORDER — DEXTROSE 50 % IV SOLN
INTRAVENOUS | Status: AC
  Filled 2017-06-27: qty 50

## 2017-06-27 MED ORDER — METOPROLOL TARTRATE 25 MG PO TABS
37.5000 mg | ORAL_TABLET | Freq: Two times a day (BID) | ORAL | Status: DC
  Administered 2017-06-27 – 2017-06-30 (×7): 37.5 mg via ORAL
  Filled 2017-06-27 (×7): qty 1

## 2017-06-27 MED ORDER — METFORMIN HCL 500 MG PO TABS
1000.0000 mg | ORAL_TABLET | Freq: Two times a day (BID) | ORAL | Status: DC
  Administered 2017-06-27 – 2017-07-03 (×11): 1000 mg via ORAL
  Filled 2017-06-27 (×13): qty 2

## 2017-06-27 NOTE — Progress Notes (Signed)
CARDIAC REHAB PHASE I   PRE:  Rate/Rhythm: 92 SR PVCs  BP:  Supine: 135/82 Sitting:   Standing:    SaO2: 100% RA  MODE:  Ambulation: 470 ft   POST:  Rate/Rhythm: 111 ST  BP:  Supine:   Sitting: 134/88  Standing:    SaO2: 100% RA  1226/1330 Patient tolerated ambulation well with assist x1 and pushing rolling walker, gait slow, steady, VSS, no c/o. To chair after walk. OHS education complete including IS use, sternal precautions, restrictions, risk factor modification, diabetic and heart healthy diet, and activity progression. Pt verbalizes understanding of instructions given. Discussed phase 2 cardiac rehab, and pt is interested. [facility] given [facility]  Artist Paislinty M Arbor Cohen, MS, ACSM CEP

## 2017-06-27 NOTE — Progress Notes (Addendum)
[address]       [place] [address]             [phone]      4 Days Post-Op Procedure(s) (LRB): CORONARY ARTERY BYPASS GRAFTING (CABG) x 3; Using Left Internal Mammary Artery and Right Leg Greater Saphenous Vein harvested endoscopically. (N/A) TRANSESOPHAGEAL ECHOCARDIOGRAM (TEE) (N/A) Subjective: Feels pretty well, not much pain, + BM, not SOB  Objective: Vital signs in last 24 hours: Temp:  [98.1 F (36.7 C)-98.7 F (37.1 C)] 98.6 F (37 C) (06/01 0446) Pulse Rate:  [79-101] 87 (06/01 0446) Cardiac Rhythm: Normal sinus rhythm (06/01 0700) Resp:  [17-26] 22 (06/01 0446) BP: (103-132)/(71-81) 123/77 (06/01 0446) SpO2:  [97 %-100 %] 98 % (06/01 0446) Weight:  [92.6 kg (204 lb 1.6 oz)] 92.6 kg (204 lb 1.6 oz) (06/01 0535)  Hemodynamic parameters for last 24 hours:    Intake/Output from previous day: 05/31 0701 - 06/01 0700 In: 600 [P.O.:600] Out: 850 [Urine:850] Intake/Output this shift: Total I/O In: -  Out: 375 [Urine:375]  General appearance: alert, cooperative and no distress Heart: regular rate and rhythm and occ extrasystole Lungs: sl coarse, dim in bases Abdomen: soft, nontender Extremities: + LE edema Wound: incis healing well  Lab Results: Recent Labs    06/26/17 0338 06/27/17 0417  WBC 9.7 7.9  HGB 9.3* 9.1*  HCT 27.9* 27.3*  PLT 137* 180   BMET:  Recent Labs    06/26/17 0338 06/27/17 0417  NA 137 135  K 4.5 4.0  CL 108 102  CO2 23 25  GLUCOSE 105* 143*  BUN 16 15  CREATININE 1.04 0.96  CALCIUM 8.1* 8.5*    PT/INR: No results for input(s): LABPROT, INR in the last 72 hours. ABG    Component Value Date/Time   PHART 7.424 06/24/2017 0339   HCO3 21.1 06/24/2017 0339   TCO2 18 (L) 06/25/2017 1716   ACIDBASEDEF 3.0 (H) 06/24/2017 0339   O2SAT 67.5 06/25/2017 0400   CBG (last 3)  Recent Labs    06/26/17 1645 06/26/17 2151 06/27/17 0628  GLUCAP 169* 158* 144*    Meds Scheduled Meds: .  acetaminophen  1,000 mg Oral Q6H   Or  . acetaminophen (TYLENOL) oral liquid 160 mg/5 mL  1,000 mg Per Tube Q6H  . aspirin EC  325 mg Oral Daily   Or  . aspirin  324 mg Per Tube Daily  . atorvastatin  80 mg Oral q1800  . bisacodyl  10 mg Oral Daily   Or  . bisacodyl  10 mg Rectal Daily  . Chlorhexidine Gluconate Cloth  6 each Topical Daily  . clonazePAM  1 mg Oral QHS  . dextrose      . docusate sodium  200 mg Oral Daily  . furosemide  40 mg Oral Daily  . insulin aspart  0-24 Units Subcutaneous TID AC & HS  . insulin aspart  5 Units Subcutaneous TID WC  . insulin detemir  15 Units Subcutaneous BID  . metoprolol tartrate  25 mg Oral BID  . mometasone-formoterol  2 puff Inhalation BID  . pantoprazole  40 mg Oral Daily  . potassium chloride  20 mEq Oral Daily  . sodium chloride flush  10-40 mL Intracatheter Q12H  . sodium chloride flush  3 mL Intravenous Q12H  . sodium chloride flush  3 mL Intravenous Q12H   Continuous Infusions: . sodium chloride Stopped (06/24/17 1114)  . sodium chloride    .  sodium chloride Stopped (06/24/17 1116)  . sodium chloride     PRN Meds:.sodium chloride, sodium chloride, magnesium hydroxide, ondansetron (ZOFRAN) IV, oxyCODONE, sodium chloride flush, sodium chloride flush, sodium chloride flush, traMADol  Xrays Dg Chest Port 1 View  Result Date: 06/26/2017 CLINICAL DATA:  Chest pain.  CABG 3 days ago. EXAM: PORTABLE CHEST 1 VIEW COMPARISON:  One-view chest x-ray 06/25/2017. FINDINGS: The heart is enlarged. Mild interstitial edema is present. The left-sided chest tube is been removed. There is no pneumothorax. Pulmonary vascular congestion is present. Right greater than left basilar airspace disease present. Overall lung volumes have improved. Right IJ sheath was removed. IMPRESSION: 1. Interval removal of chest tube and right IJ line without pneumothorax. 2. Improving lung volumes. 3. Residual bibasilar airspace opacities, likely atelectasis. 4. Suspect  right pleural effusion. Electronically Signed   By: Marin Robertshristopher  Mattern M.D.   On: 06/26/2017 09:41    Assessment/Plan: S/P Procedure(s) (LRB): CORONARY ARTERY BYPASS GRAFTING (CABG) x 3; Using Left Internal Mammary Artery and Right Leg Greater Saphenous Vein harvested endoscopically. (N/A) TRANSESOPHAGEAL ECHOCARDIOGRAM (TEE) (N/A)  1 clinically stable and progressing well 2 freq PVC's , normal K+ /Mg++ levels, will increase beta blocker a little, normal EF but some mild hypokinesis on TEE 3 Hgb A1c 7.4, will resume metformin and cont to transition to home meds, needs aggressive dietary management which is difficult in current social situation( group home). BS control is reasonable currrently 4 SW to assist with short term rehab placement in SNF setting 5 H/H is stable and thrombocytopenia resolved, no leukocytosis 6 push pulm toilet and rehab as able 7 cont gentle diuresis for volume overload, BUN and creat in normal range 8 no ACE-ARB yet - BP controlled and adjusting beta blocker   LOS: 10 days    Rowe ClackWayne E Gold 06/27/2017  I have seen and examined the patient and agree with the assessment and plan as outlined.  Purcell Nailslarence H Owen, MD 06/27/2017 11:20 AM

## 2017-06-27 NOTE — Plan of Care (Signed)
Problem: Activity: Goal: Risk for activity intolerance will decrease Outcome: Progressing Note:  Walking room and halls independantly

## 2017-06-28 LAB — GLUCOSE, CAPILLARY
GLUCOSE-CAPILLARY: 64 mg/dL — AB (ref 65–99)
Glucose-Capillary: 111 mg/dL — ABNORMAL HIGH (ref 65–99)
Glucose-Capillary: 90 mg/dL (ref 65–99)
Glucose-Capillary: 116 mg/dL — ABNORMAL HIGH (ref 65–99)
Glucose-Capillary: 233 mg/dL — ABNORMAL HIGH (ref 65–99)

## 2017-06-28 MED ORDER — GLIPIZIDE 5 MG PO TABS
5.0000 mg | ORAL_TABLET | Freq: Every day | ORAL | Status: DC
  Administered 2017-06-28 – 2017-07-03 (×6): 5 mg via ORAL
  Filled 2017-06-28 (×6): qty 1

## 2017-06-28 NOTE — Progress Notes (Addendum)
[address]       [place] [id number]             [phone]      5 Days Post-Op Procedure(s) (LRB): CORONARY ARTERY BYPASS GRAFTING (CABG) x 3; Using Left Internal Mammary Artery and Right Leg Greater Saphenous Vein harvested endoscopically. (N/A) TRANSESOPHAGEAL ECHOCARDIOGRAM (TEE) (N/A) Subjective: Feels better, no specific c/o,   Objective: Vital signs in last 24 hours: Temp:  [98.2 F (36.8 C)-98.9 F (37.2 C)] 98.9 F (37.2 C) (06/02 0334) Pulse Rate:  [85-97] 85 (06/02 0334) Cardiac Rhythm: Normal sinus rhythm (06/02 0700) Resp:  [20-23] 20 (06/01 2346) BP: (120-135)/(72-81) 123/81 (06/02 0334) SpO2:  [96 %-99 %] 96 % (06/02 0334) Weight:  [91.9 kg (202 lb 8 oz)] 91.9 kg (202 lb 8 oz) (06/02 82420608)  Hemodynamic parameters for last 24 hours:    Intake/Output from previous day: 06/01 0701 - 06/02 0700 In: 1160 [P.O.:1160] Out: 375 [Urine:375] Intake/Output this shift: No intake/output data recorded.  General appearance: alert, cooperative and no distress Heart: regular rate and rhythm Lungs: clear to auscultation bilaterally Abdomen: benign Extremities: + edema BLE Wound: incis healing well  Lab Results: Recent Labs    06/26/17 0338 06/27/17 0417  WBC 9.7 7.9  HGB 9.3* 9.1*  HCT 27.9* 27.3*  PLT 137* 180   BMET:  Recent Labs    06/26/17 0338 06/27/17 0417  NA 137 135  K 4.5 4.0  CL 108 102  CO2 23 25  GLUCOSE 105* 143*  BUN 16 15  CREATININE 1.04 0.96  CALCIUM 8.1* 8.5*    PT/INR: No results for input(s): LABPROT, INR in the last 72 hours. ABG    Component Value Date/Time   PHART 7.424 06/24/2017 0339   HCO3 21.1 06/24/2017 0339   TCO2 18 (L) 06/25/2017 1716   ACIDBASEDEF 3.0 (H) 06/24/2017 0339   O2SAT 67.5 06/25/2017 0400   CBG (last 3)  Recent Labs    06/27/17 1815 06/27/17 2126 06/28/17 0608  GLUCAP 206* 164* 111*    Meds Scheduled Meds: . acetaminophen  1,000 mg Oral Q6H   Or  . acetaminophen  (TYLENOL) oral liquid 160 mg/5 mL  1,000 mg Per Tube Q6H  . aspirin EC  325 mg Oral Daily   Or  . aspirin  324 mg Per Tube Daily  . atorvastatin  80 mg Oral q1800  . bisacodyl  10 mg Oral Daily   Or  . bisacodyl  10 mg Rectal Daily  . Chlorhexidine Gluconate Cloth  6 each Topical Daily  . clonazePAM  1 mg Oral QHS  . docusate sodium  200 mg Oral Daily  . furosemide  40 mg Oral Daily  . insulin aspart  0-24 Units Subcutaneous TID AC & HS  . insulin aspart  5 Units Subcutaneous TID WC  . insulin detemir  15 Units Subcutaneous BID  . metFORMIN  1,000 mg Oral BID WC  . metoprolol tartrate  37.5 mg Oral BID  . mometasone-formoterol  2 puff Inhalation BID  . pantoprazole  40 mg Oral Daily  . potassium chloride  20 mEq Oral Daily  . sodium chloride flush  10-40 mL Intracatheter Q12H  . sodium chloride flush  3 mL Intravenous Q12H  . sodium chloride flush  3 mL Intravenous Q12H   Continuous Infusions: . sodium chloride Stopped (06/24/17 1114)  . sodium chloride    . sodium chloride Stopped (06/24/17 1116)  . sodium chloride  PRN Meds:.sodium chloride, sodium chloride, magnesium hydroxide, ondansetron (ZOFRAN) IV, oxyCODONE, sodium chloride flush, sodium chloride flush, sodium chloride flush, traMADol  Xrays Dg Chest 2 View  Result Date: 06/27/2017 CLINICAL DATA:  52 year old male with a history of coronary artery bypass graft procedure EXAM: CHEST - 2 VIEW COMPARISON:  Prior chest x-ray 06/26/2017 FINDINGS: Improving aeration with decreasing bilateral pleural effusions and improving bibasilar atelectasis. Epicardial pacing leads remain in place. Stable cardiomegaly. Patient is status post median sternotomy with evidence of multivessel CABG. Metallic stents overlie the left heart. No evidence of pneumothorax, pulmonary edema or new airspace consolidation. No acute osseous abnormality. IMPRESSION: 1. Persistent but improving bilateral pleural effusions and associated bibasilar atelectasis.  2. Stable cardiomegaly without evidence of failure. Electronically Signed   By: Malachy Moan M.D.   On: 06/27/2017 09:33    Assessment/Plan: S/P Procedure(s) (LRB): CORONARY ARTERY BYPASS GRAFTING (CABG) x 3; Using Left Internal Mammary Artery and Right Leg Greater Saphenous Vein harvested endoscopically. (N/A) TRANSESOPHAGEAL ECHOCARDIOGRAM (TEE) (N/A)  1 making steady progress 2 less ectopy and HR control is improved on current beta blocker, SBP   120's-130's - will hold on ACE-I/ARB for now as BP acceptable for current guidelines and EF is in normal range 3 CBG's control is fairly good but room to add glucotrol back which he was on preop. Resume Lantus at discharge instead of Levemir. 4 no new labs 5 cont gentle diuresis UOP is good , won't need much longer as he appears to be near prop weight. 6 cont routine pulm toilet/rehab 7 SW has been consulted for short term SNF prior to return to group home if able to arrange.  LOS: 11 days    Rowe Clack 06/28/2017  I have seen and examined the patient and agree with the assessment and plan as outlined.  Clinically doing well.  Needs SNF placement  Purcell Nails, MD 06/28/2017 10:28 AM

## 2017-06-29 LAB — GLUCOSE, CAPILLARY
GLUCOSE-CAPILLARY: 118 mg/dL — AB (ref 65–99)
GLUCOSE-CAPILLARY: 78 mg/dL (ref 65–99)
GLUCOSE-CAPILLARY: 135 mg/dL — AB (ref 65–99)
Glucose-Capillary: 234 mg/dL — ABNORMAL HIGH (ref 65–99)

## 2017-06-29 NOTE — Progress Notes (Signed)
Patient up to chair x1 this AM will monitor patient. [place] Depaz Jessup RN  

## 2017-06-29 NOTE — Progress Notes (Signed)
Inpatient Diabetes Program Recommendations  AACE/ADA: New Consensus Statement on Inpatient Glycemic Control (2019)  Target Ranges:  Prepandial:   less than 140 mg/dL      Peak postprandial:   less than 180 mg/dL (1-2 hours)      Critically ill patients:  140 - 180 mg/dL   Results for Marcus Cummings, [name]. (MRN [id number]) as of 06/29/2017 11:25  Ref. Range 06/28/2017 06:08 06/28/2017 11:41 06/28/2017 16:24 06/28/2017 17:02 06/28/2017 21:51 06/29/2017 06:01  Glucose-Capillary Latest Ref Range: 65 - 99 mg/dL 782111 (H) 90 64 (L) 956116 (H) 233 (H) 118 (H)   Review of Glycemic Control  Current orders for Inpatient glycemic control: Levemir 15 units BID, Novolog 5 units TID with meals for meal coverage, Novolog 0-24 units ACHS, Glipizide 5 mg QAM, Metformin 1000 mg BID  Inpatient Diabetes Program Recommendations: Oral Agents: Noted glucose down to 64 mg/dl on 2/1/306/2/19 and Glipizide was restarted on 06/28/17. While inpatient, please consider discontinuing Glipizide.  Thanks, Orlando PennerMarie Mekenna Finau, RN, MSN, CDE Diabetes Coordinator Inpatient Diabetes Program [phone] (Team Pager from 8am to 5pm)

## 2017-06-29 NOTE — Clinical Social Work Note (Signed)
Clinical Social Work Assessment  Patient Details  Name: Marcus Cummings. MRN: [id number] Date of Birth: 1965/03/11  Date of referral:  06/29/17               Reason for consult:  Discharge Planning, Facility Placement                Permission sought to share information with:  Family Supports Permission granted to share information::  Yes, Verbal Permission Granted  Name::     Marcus Cummings  Agency::  snf  Relationship::  [phone]  Contact Information:  sister  Housing/Transportation Living arrangements for the past 2 months:  Group Home([facility] Source of Information:  Patient Patient Interpreter Needed:  None Criminal Activity/Legal Involvement Pertinent to Current Situation/Hospitalization:    Significant Relationships:  Siblings Lives with:  Roommate Do you feel safe going back to the place where you live?  Yes Need for family participation in patient care:  Yes (Comment)  Care giving concerns: No family at bedside. Patient stated he lives at [facility] a group home geared towards veterans. Patient stated he has a room that he shares with a roommate. Patient stated that the group home has two nurses and nurse tech that rotates Monday - Friday 8am-5pm. Patient stated that he would be the one responsible for his medication and he would have to sign it out everyday. Patient stated he also get drug tested periodically while being at the facility.  Social Worker assessment / plan:  Patient stated he is able to return to the group home but stated that he has certain chores he has to do to be able to stay at the group home. 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 CSW explained the situation to patient about having to contact the [place] for benefit confirmation and to see if can they will approve his stay at a SNF. Patient stated he doubts CSW will be able to reach anyone  and stated that he does not think [facility] will help him. Patient stated if he has to go back to group home then he will because he has his bed there. Patient stated that the Group home is also helping him find stable housing.  Patient stated he doubts CSW will be able to place patient in a SNF but will allow CSW to try and contact [facility] and the [facility] again in the morning. CSW left voice for patients social work at the [facility] [phone] ext: [phone]/ pager# [phone]). CSW contacted [facility] [phone]) but was unable to reach anyone.   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 Patient aware that the [place] may not pay for SNF  Patient/Family's Understanding of and Emotional Response to Diagnosis, Current Treatment, and Prognosis:  Patent stated he will be able to go back to [facility] if SNF is not an option.  Emotional Assessment Appearance:  Appears older than stated age Attitude/Demeanor/Rapport:  Engaged Affect (typically observed):  Accepting Orientation:  Oriented to Situation, Oriented to Self, Oriented to Place, Oriented to  Time Alcohol / Substance use:  Illicit Drugs(has hx of cocaine use. 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

## 2017-06-29 NOTE — Progress Notes (Addendum)
[address]       [place] [address]             [phone]      6 Days Post-Op Procedure(s) (LRB): CORONARY ARTERY BYPASS GRAFTING (CABG) x 3; Using Left Internal Mammary Artery and Right Leg Greater Saphenous Vein harvested endoscopically. (N/A) TRANSESOPHAGEAL ECHOCARDIOGRAM (TEE) (N/A) Subjective: Feels pretty well  Objective: Vital signs in last 24 hours: Temp:  [98.6 F (37 C)-99 F (37.2 C)] 99 F (37.2 C) (06/03 0435) Pulse Rate:  [84-101] 84 (06/03 0435) Cardiac Rhythm: Normal sinus rhythm (06/03 0700) Resp:  [17-25] 17 (06/03 0435) BP: (97-115)/(56-69) 97/56 (06/03 0435) SpO2:  [98 %-99 %] 99 % (06/03 0435) Weight:  [90 kg (198 lb 8 oz)] 90 kg (198 lb 8 oz) (06/03 0435)  Hemodynamic parameters for last 24 hours:    Intake/Output from previous day: No intake/output data recorded. Intake/Output this shift: No intake/output data recorded.  General appearance: alert, cooperative and no distress Heart: regular rate and rhythm Lungs: mildly dim in bases Abdomen: benign Extremities: edema is improving Wound: incis healing well  Lab Results: Recent Labs    06/27/17 0417  WBC 7.9  HGB 9.1*  HCT 27.3*  PLT 180   BMET:  Recent Labs    06/27/17 0417  NA 135  K 4.0  CL 102  CO2 25  GLUCOSE 143*  BUN 15  CREATININE 0.96  CALCIUM 8.5*    PT/INR: No results for input(s): LABPROT, INR in the last 72 hours. ABG    Component Value Date/Time   PHART 7.424 06/24/2017 0339   HCO3 21.1 06/24/2017 0339   TCO2 18 (L) 06/25/2017 1716   ACIDBASEDEF 3.0 (H) 06/24/2017 0339   O2SAT 67.5 06/25/2017 0400   CBG (last 3)  Recent Labs    06/28/17 1702 06/28/17 2151 06/29/17 0601  GLUCAP 116* 233* 118*    Meds Scheduled Meds: . aspirin EC  325 mg Oral Daily   Or  . aspirin  324 mg Per Tube Daily  . atorvastatin  80 mg Oral q1800  . bisacodyl  10 mg Oral Daily   Or  . bisacodyl  10 mg Rectal Daily  . Chlorhexidine Gluconate  Cloth  6 each Topical Daily  . clonazePAM  1 mg Oral QHS  . docusate sodium  200 mg Oral Daily  . furosemide  40 mg Oral Daily  . glipiZIDE  5 mg Oral QAC breakfast  . insulin aspart  0-24 Units Subcutaneous TID AC & HS  . insulin aspart  5 Units Subcutaneous TID WC  . insulin detemir  15 Units Subcutaneous BID  . metFORMIN  1,000 mg Oral BID WC  . metoprolol tartrate  37.5 mg Oral BID  . mometasone-formoterol  2 puff Inhalation BID  . pantoprazole  40 mg Oral Daily  . potassium chloride  20 mEq Oral Daily  . sodium chloride flush  10-40 mL Intracatheter Q12H  . sodium chloride flush  3 mL Intravenous Q12H  . sodium chloride flush  3 mL Intravenous Q12H   Continuous Infusions: . sodium chloride Stopped (06/24/17 1114)  . sodium chloride    . sodium chloride Stopped (06/24/17 1116)  . sodium chloride     PRN Meds:.sodium chloride, sodium chloride, magnesium hydroxide, ondansetron (ZOFRAN) IV, oxyCODONE, sodium chloride flush, sodium chloride flush, sodium chloride flush, traMADol  Xrays Dg Chest 2 View  Result Date: 06/27/2017 CLINICAL DATA:  52 year old male with a history  of coronary artery bypass graft procedure EXAM: CHEST - 2 VIEW COMPARISON:  Prior chest x-ray 06/26/2017 FINDINGS: Improving aeration with decreasing bilateral pleural effusions and improving bibasilar atelectasis. Epicardial pacing leads remain in place. Stable cardiomegaly. Patient is status post median sternotomy with evidence of multivessel CABG. Metallic stents overlie the left heart. No evidence of pneumothorax, pulmonary edema or new airspace consolidation. No acute osseous abnormality. IMPRESSION: 1. Persistent but improving bilateral pleural effusions and associated bibasilar atelectasis. 2. Stable cardiomegaly without evidence of failure. Electronically Signed   By: Malachy MoanHeath  McCullough M.D.   On: 06/27/2017 09:33    Assessment/Plan: S/P Procedure(s) (LRB): CORONARY ARTERY BYPASS GRAFTING (CABG) x 3; Using  Left Internal Mammary Artery and Right Leg Greater Saphenous Vein harvested endoscopically. (N/A) TRANSESOPHAGEAL ECHOCARDIOGRAM (TEE) (N/A)  1 feels well, conts to improve overall 2 SBP stable in low 100's, conts to have some PVC's including trigemminy. No new labs 4 BS pretty good control- cont current RX for now 5 cont pulm toilet and rehab 6 SNF placement per SW assist 7 d/c epw's today 8 cont to diurese  LOS: 12 days    Rowe ClackWayne E Gold 06/29/2017  Ready for SNF 6-4 patient examined and medical record reviewed,agree with above note. Kathlee Nationseter Van Trigt III 06/29/2017

## 2017-06-29 NOTE — Progress Notes (Signed)
CARDIAC REHAB PHASE I   PRE:  Rate/Rhythm: 91 SR  BP:  Sitting: 117/78      SaO2: 100 RA  MODE:  Ambulation: 420 ft   POST:  Rate/Rhythm: 99 SR  BP:  Sitting: 133/76    SaO2: 98 RA  Pt ambulated 46220ft in hallway stand-by assist. Steady gait. Pt did not want to go further. C/o not much sleep last night and incisional pain 8/10. RN made aware. Pt in recliner, call bell within reach.  [phone]  Marcus Boweneresa  Teagan Heidrick, RN BSN 06/29/2017 11:40 AM

## 2017-06-29 NOTE — Progress Notes (Signed)
Pacer wires removed tips intact. No bleeding from sites. Patient informed to remain in bed until 1330.

## 2017-06-30 DIAGNOSIS — Z951 Presence of aortocoronary bypass graft: Secondary | ICD-10-CM

## 2017-06-30 DIAGNOSIS — R000 Tachycardia, unspecified: Secondary | ICD-10-CM

## 2017-06-30 LAB — GLUCOSE, CAPILLARY
GLUCOSE-CAPILLARY: 120 mg/dL — AB (ref 65–99)
GLUCOSE-CAPILLARY: 164 mg/dL — AB (ref 65–99)
GLUCOSE-CAPILLARY: 113 mg/dL — AB (ref 65–99)
GLUCOSE-CAPILLARY: 115 mg/dL — AB (ref 65–99)

## 2017-06-30 MED ORDER — METOPROLOL TARTRATE 50 MG PO TABS
50.0000 mg | ORAL_TABLET | Freq: Two times a day (BID) | ORAL | Status: DC
  Administered 2017-06-30 – 2017-07-03 (×6): 50 mg via ORAL
  Filled 2017-06-30 (×6): qty 1

## 2017-06-30 NOTE — Evaluation (Signed)
Physical Therapy Evaluation Patient Details Name: Marcus Cummings Greenly Sr. MRN: [id number] DOB: 05-Oct-1965 Today's Date: 06/30/2017   History of Present Illness  Marcus Cummings Marcus Sr. is a 52 y.o. male with a history of HTN, DM, MI with 2 stents [facility] hx of stroke in 2017 on plavix, and mood disorder. Admitted with angina; now s/p CABG  Clinical Impression   Patient is s/p above surgery resulting in functional limitations due to the deficits listed below (see PT Problem List). Overall moving well; Independent with ambulation and simple mobility; Will plan to see Mr. Marcus Cummings for likely one more session to reinforce sternal precautions, and see about stair negotiation for community access; Agree with dc to his Group home with familiar routine and environment;  Patient will benefit from skilled PT to increase their independence and safety with mobility to allow discharge to the venue listed below.       Follow Up Recommendations No PT follow up    Equipment Recommendations  None recommended by PT    Recommendations for Other Services OT consult     Precautions / Restrictions Precautions Precautions: Sternal Precaution Booklet Issued: No Precaution Comments: Mr. Marcus Cummings was not open to further education after walking; noted Cardiac Rehab is working with him also with continuing efforts at education      Mobility  Bed Mobility Overal bed mobility: Modified Independent                Transfers Overall transfer level: Modified independent Equipment used: None             General transfer comment: NO difficulty with rise, no need for UE push or steadying  Ambulation/Gait Ambulation/Gait assistance: Min guard;Supervision Ambulation Distance (Feet): 470 Feet Assistive device: None Gait Pattern/deviations: Step-through pattern Gait velocity: approaching WNL   General Gait Details: Noted heavier footfall LLE with slight assymetric Cummings lean; No gross loss of  balance; gait assymetry likely residual from previous CVA  Stairs Stairs: (pt declined)          Wheelchair Mobility    Modified Rankin (Stroke Patients Only)       Balance Overall balance assessment: No apparent balance deficits (not formally assessed)                                           Pertinent Vitals/Pain Pain Assessment: Faces Faces Pain Scale: Hurts a little bit Pain Location: Soreness at chest from surgery Pain Descriptors / Indicators: Aching Pain Intervention(s): Monitored during session    Home Living Family/patient expects to be discharged to:: Group home Living Arrangements: Group Home Available Help at Discharge: Available PRN/intermittently Type of Home: Group Home Home Access: Level entry     Home Layout: One level        Prior Function Level of Independence: Independent               Hand Dominance        Extremity/Trunk Assessment   Upper Extremity Assessment Upper Extremity Assessment: Overall WFL for tasks assessed(within sternal precautions)    Lower Extremity Assessment Lower Extremity Assessment: Overall WFL for tasks assessed;LLE deficits/detail LLE Deficits / Details: Minimal residual weakness from previous CVA; lead to assymetric gait pattern       Communication   Communication: No difficulties  Cognition Arousal/Alertness: Awake/alert Behavior During Therapy: Flat affect Overall Cognitive Status: Within Functional  Limits for tasks assessed(for simple mobiltiy tasks)                                 General Comments: Per RN, pt feeling down today, disappointed that he isn't going home today      General Comments General comments (skin integrity, edema, etc.): VSS on Room Air    Exercises     Assessment/Plan    PT Assessment Patient needs continued PT services  PT Problem List Decreased activity tolerance;Decreased mobility;Decreased knowledge of precautions       PT  Treatment Interventions Gait training;Stair training;Functional mobility training;Therapeutic activities;Therapeutic exercise;Patient/family education    PT Goals (Current goals can be found in the Care Plan section)  Acute Rehab PT Goals Patient Stated Goal: HOpes to be home soon PT Goal Formulation: With patient Time For Goal Achievement: 07/07/17(will likely meet goal after 1 session) Potential to Achieve Goals: Good    Frequency Min 3X/week(Will llikely meet goals next session)   Barriers to discharge   None    Co-evaluation               AM-PAC PT "6 Clicks" Daily Activity  Outcome Measure Difficulty turning over in bed (including adjusting bedclothes, sheets and blankets)?: None Difficulty moving from lying on back to sitting on the side of the bed? : None Difficulty sitting down on and standing up from a chair with arms (e.g., wheelchair, bedside commode, etc,.)?: None Help needed moving to and from a bed to chair (including a wheelchair)?: None Help needed walking in hospital room?: None Help needed climbing 3-5 steps with a railing? : None 6 Click Score: 24    End of Session   Activity Tolerance: Patient tolerated treatment well Patient left: in chair;with call bell/phone within reach Nurse Communication: Mobility status PT Visit Diagnosis: Other abnormalities of gait and mobility (R26.89)    Time: 1445-1500 PT Time Calculation (min) (ACUTE ONLY): 15 min   Charges:   PT Evaluation $PT Eval Low Complexity: 1 Low     PT G Codes:        Van Clines, PT  Acute Rehabilitation Services Pager [phone] Office [phone]   Levi Aland 06/30/2017, 3:41 PM

## 2017-06-30 NOTE — Progress Notes (Addendum)
Progress Note  Patient Name: Marcus POPLASKI Sr. Date of Encounter: 06/30/2017  Primary Cardiologist: Nanetta Batty, MD  Subjective   Feeling well this morning. Ready to to be discharged.   Inpatient Medications    Scheduled Meds: . aspirin EC  325 mg Oral Daily   Or  . aspirin  324 mg Per Tube Daily  . atorvastatin  80 mg Oral q1800  . bisacodyl  10 mg Oral Daily   Or  . bisacodyl  10 mg Rectal Daily  . Chlorhexidine Gluconate Cloth  6 each Topical Daily  . clonazePAM  1 mg Oral QHS  . docusate sodium  200 mg Oral Daily  . furosemide  40 mg Oral Daily  . glipiZIDE  5 mg Oral QAC breakfast  . insulin aspart  0-24 Units Subcutaneous TID AC & HS  . insulin aspart  5 Units Subcutaneous TID WC  . insulin detemir  15 Units Subcutaneous BID  . metFORMIN  1,000 mg Oral BID WC  . metoprolol tartrate  37.5 mg Oral BID  . mometasone-formoterol  2 puff Inhalation BID  . pantoprazole  40 mg Oral Daily  . potassium chloride  20 mEq Oral Daily  . sodium chloride flush  10-40 mL Intracatheter Q12H  . sodium chloride flush  3 mL Intravenous Q12H  . sodium chloride flush  3 mL Intravenous Q12H   Continuous Infusions: . sodium chloride Stopped (06/24/17 1114)  . sodium chloride    . sodium chloride Stopped (06/24/17 1116)  . sodium chloride     PRN Meds: sodium chloride, sodium chloride, magnesium hydroxide, ondansetron (ZOFRAN) IV, oxyCODONE, sodium chloride flush, sodium chloride flush, sodium chloride flush, traMADol   Vital Signs    Vitals:   06/29/17 2009 06/29/17 2020 06/30/17 0414 06/30/17 0805  BP:  111/80 129/75 118/75  Pulse: 100 99 93 87  Resp: 15 (!) 22 (!) 22   Temp:  98.9 F (37.2 C) 98.3 F (36.8 C) 98.5 F (36.9 C)  TempSrc:  Oral Oral Oral  SpO2: 95% 99% 98% 95%  Weight:   195 lb 12.3 oz (88.8 kg)   Height:       No intake or output data in the 24 hours ending 06/30/17 0920 Filed Weights   06/28/17 0608 06/29/17 0435 06/30/17 0414  Weight: 202 lb 8  oz (91.9 kg) 198 lb 8 oz (90 kg) 195 lb 12.3 oz (88.8 kg)    Telemetry    SR with episodes of ST at times - Personally Reviewed  Physical Exam   General: Younger AA male appearing in no acute distress. Head: Normocephalic, atraumatic.  Neck: Supple, no JVD. Lungs:  Resp regular and unlabored, CTA. Heart: RRR, S1, S2. Abdomen: Soft, non-tender, non-distended with normoactive bowel sounds.  Extremities: No clubbing, cyanosis, edema.  Neuro: Alert and oriented X 3. Moves all extremities spontaneously. Psych: Normal affect.  Labs    Chemistry Recent Labs  Lab 06/25/17 0403 06/25/17 1716 06/26/17 0338 06/27/17 0417  NA 136 144 137 135  K 4.2 2.9* 4.5 4.0  CL 103 107 108 102  CO2 25  --  23 25  GLUCOSE 168* 108* 105* 143*  BUN 12 10 16 15   CREATININE 1.04 0.60* 1.04 0.96  CALCIUM 8.1*  --  8.1* 8.5*  GFRNONAA >60  --  >60 >60  GFRAA >60  --  >60 >60  ANIONGAP 8  --  6 8     Hematology Recent Labs  Lab 06/25/17 0403  06/25/17 1716 06/26/17 0338 06/27/17 0417  WBC 10.5  --  9.7 7.9  RBC 3.17*  --  3.08* 2.94*  HGB 9.7* 8.2* 9.3* 9.1*  HCT 29.0* 24.0* 27.9* 27.3*  MCV 91.5  --  90.6 92.9  MCH 30.6  --  30.2 31.0  MCHC 33.4  --  33.3 33.3  RDW 14.9  --  14.9 14.8  PLT 133*  --  137* 180    Cardiac EnzymesNo results for input(s): TROPONINI in the last 168 hours. No results for input(s): TROPIPOC in the last 168 hours.   BNPNo results for input(s): BNP, PROBNP in the last 168 hours.   DDimer No results for input(s): DDIMER in the last 168 hours.    Radiology    No results found.  Cardiac Studies   Cath: 06/18/17  Conclusion     Ost 1st Mrg to 1st Mrg lesion is 90% stenosed.  Prox LAD to Mid LAD lesion is 50% stenosed.  Ost 1st Diag lesion is 80% stenosed.  Prox RCA lesion is 30% stenosed.  Mid RCA-1 lesion is 60% stenosed.  Mid RCA-2 lesion is 20% stenosed.  The left ventricular ejection fraction is 50-55% by visual estimate.  LV end  diastolic pressure is normal.  The left ventricular systolic function is normal.  Mid LM to Ost LAD lesion is 80% stenosed.  Ost Cx to Prox Cx lesion is 95% stenosed.  Mid LM lesion is 80% stenosed.   Multivessel CAD with 80% distal left main stenosis with 80% ostial in-stent restenosis of a previously placed LAD stent and 95% ostial circumflex/Ramus stenosis.  The LAD has a stent extending from the ostium to the mid segment and another stent arises within the stented segment to the first diagonal vessel.  There is proximal in-stent restenosis of 80% extending to the ostium and distal stent restenosis of 50%.  There is 80% ostial stenosis in the stent in the diagonal vessel.  Ramus/circumflex has 95% stenosis.  The major vessel is a ramus vessel which extends towards the apex.  The proximal branch is an AV groove circumflex which is small caliber and has 95% stenosis.  There is collateralization of this distal circumflex marginal by the via the PLA branch of the RCA.  The right coronary artery is tortuous and is 30% proximal stenosis, 50 to 60% stenosis and a band of the vessel followed by 20% stenosis.  The PLA collateralizes the distal circumflex marginal branch.  RECOMMENDATION: Patient has been on Plavix.  Plavix will be held for Plavix washout.  Surgical consultation will be obtained.  Will initiate heparin therapy 8 hours following radial sheath removal and aggressive medical therapy until CABG surgery.   TTE: 06/19/17  Study Conclusions  - Left ventricle: The cavity size was normal. Wall thickness was   increased in a pattern of moderate LVH. Systolic function was   normal. The estimated ejection fraction was in the range of 50%   to 55%. Although no diagnostic regional wall motion abnormality   was identified, this possibility cannot be completely excluded on   the basis of this study. Doppler parameters are consistent with   abnormal left ventricular relaxation (grade 1  diastolic   dysfunction). The E/e&' ratio is between 8-15, suggesting   indeterminate LV filling pressure. - Aortic valve: Trileaflet. Sclerosis without stenosis. There was   mild regurgitation. - Mitral valve: Mildly thickened leaflets . There was trivial   regurgitation. - Left atrium: The atrium was normal in size. -  Inferior vena cava: The vessel was normal in size. The   respirophasic diameter changes were in the normal range (>= 50%),   consistent with normal central venous pressure.  Impressions:  - LVEF 50-55%, moderate LVH, grade 1 DD, indetermiante LV filling   pressure, aortic valve sclerosis with mild AI, trivial MR, normal   LA size, normal IVC.   Patient Profile     52 y.o. male status post remote PCI and stenting back in 2007. He underwent cardiac catheterization by Dr. Tresa Endo revealing left main disease with disease in the LAD and circumflex as well as ramus branch as well. Seen by Dr. Donata Clay and underwent CABG x3. Had progressed well post op.   Assessment & Plan    1. CAD: s/p 3v CABG with Dr. Donata Clay and progressing well. Troponin only peaked at 0.05. Plans for discharge to possible SNF if SW is able to place. On full dose ASA.   2. HTN: stable with current therapy with BB  3. HL: remains on statin therapy. Lipitor was increased from 40 to 80mg  this admission. Will need FLP/LFTs in 6 weeks.   4. Anemia: stable post op.   Signed, Laverda Page, NP  06/30/2017, 9:20 AM  Pager # [phone]   For questions or updates, please contact [facility] Please consult [email or web address] for contact info under Cardiology/STEMI.  Patient seen and examined. Agree with assessment and plan. Pt known to me from cath. Doing well.  HR 106, with rare to occ PVC.  Will increase metoprolol to 50 mg bid.   Lennette Bihari, MD, [facility] 06/30/2017 6:16 PM

## 2017-06-30 NOTE — Discharge Instructions (Signed)

## 2017-06-30 NOTE — Progress Notes (Signed)
CARDIAC REHAB PHASE I   PRE:  Rate/Rhythm: 105 ST  BP:  Supine:   Sitting: 118/75  Standing:    SaO2: 97%RA  MODE:  Ambulation: 470 ft   POST:  Rate/Rhythm: 126 ST pair PVCS, PVCs  BP:  Supine:   Sitting: 140/87  Standing:    SaO2: 99%RA [id number] Pt walked 470 ft with stand by asst with fairly steady gait. Tolerated well. Pt not very talkative. Tried to engage in conversation. To recliner after walk. Pt given a new IS as the one he was using kept getting stuck. He demonstrated 500 ml correctly.    Luetta Nuttingharlene Navarre Diana, RN BSN  06/30/2017 9:30 AM

## 2017-06-30 NOTE — Progress Notes (Signed)
Patient educated on  Doctors orders to walk. Patient refused  and informed nurse " I do not want to" . Will encourage patient again

## 2017-06-30 NOTE — Progress Notes (Addendum)
[address]       [place] [address]             [phone]      7 Days Post-Op Procedure(s) (LRB): CORONARY ARTERY BYPASS GRAFTING (CABG) x 3; Using Left Internal Mammary Artery and Right Leg Greater Saphenous Vein harvested endoscopically. (N/A) TRANSESOPHAGEAL ECHOCARDIOGRAM (TEE) (N/A) Subjective: Feels ok, no new issues  Objective: Vital signs in last 24 hours: Temp:  [98.3 F (36.8 C)-98.9 F (37.2 C)] 98.3 F (36.8 C) (06/04 0414) Pulse Rate:  [79-108] 93 (06/04 0414) Cardiac Rhythm: Normal sinus rhythm (06/04 0700) Resp:  [14-24] 22 (06/04 0414) BP: (111-133)/(61-87) 129/75 (06/04 0414) SpO2:  [95 %-99 %] 98 % (06/04 0414) Weight:  [88.8 kg (195 lb 12.3 oz)] 88.8 kg (195 lb 12.3 oz) (06/04 0414)  Hemodynamic parameters for last 24 hours:    Intake/Output from previous day: 06/03 0701 - 06/04 0700 In: 118 [P.O.:118] Out: -  Intake/Output this shift: No intake/output data recorded.  General appearance: alert, cooperative and no distress Heart: regular rate and rhythm Lungs: clear to auscultation bilaterally Abdomen: benign Extremities: + edema Wound: incis healing well  Lab Results: No results for input(s): WBC, HGB, HCT, PLT in the last 72 hours. BMET: No results for input(s): NA, K, CL, CO2, GLUCOSE, BUN, CREATININE, CALCIUM in the last 72 hours.  PT/INR: No results for input(s): LABPROT, INR in the last 72 hours. ABG    Component Value Date/Time   PHART 7.424 06/24/2017 0339   HCO3 21.1 06/24/2017 0339   TCO2 18 (L) 06/25/2017 1716   ACIDBASEDEF 3.0 (H) 06/24/2017 0339   O2SAT 67.5 06/25/2017 0400   CBG (last 3)  Recent Labs    06/29/17 1637 06/29/17 2141 06/30/17 0642  GLUCAP 78 135* 120*    Meds Scheduled Meds: . aspirin EC  325 mg Oral Daily   Or  . aspirin  324 mg Per Tube Daily  . atorvastatin  80 mg Oral q1800  . bisacodyl  10 mg Oral Daily   Or  . bisacodyl  10 mg Rectal Daily  . Chlorhexidine  Gluconate Cloth  6 each Topical Daily  . clonazePAM  1 mg Oral QHS  . docusate sodium  200 mg Oral Daily  . furosemide  40 mg Oral Daily  . glipiZIDE  5 mg Oral QAC breakfast  . insulin aspart  0-24 Units Subcutaneous TID AC & HS  . insulin aspart  5 Units Subcutaneous TID WC  . insulin detemir  15 Units Subcutaneous BID  . metFORMIN  1,000 mg Oral BID WC  . metoprolol tartrate  37.5 mg Oral BID  . mometasone-formoterol  2 puff Inhalation BID  . pantoprazole  40 mg Oral Daily  . potassium chloride  20 mEq Oral Daily  . sodium chloride flush  10-40 mL Intracatheter Q12H  . sodium chloride flush  3 mL Intravenous Q12H  . sodium chloride flush  3 mL Intravenous Q12H   Continuous Infusions: . sodium chloride Stopped (06/24/17 1114)  . sodium chloride    . sodium chloride Stopped (06/24/17 1116)  . sodium chloride     PRN Meds:.sodium chloride, sodium chloride, magnesium hydroxide, ondansetron (ZOFRAN) IV, oxyCODONE, sodium chloride flush, sodium chloride flush, sodium chloride flush, traMADol  Xrays No results found.  Assessment/Plan: S/P Procedure(s) (LRB): CORONARY ARTERY BYPASS GRAFTING (CABG) x 3; Using Left Internal Mammary Artery and Right Leg Greater Saphenous Vein harvested endoscopically. (N/A) TRANSESOPHAGEAL ECHOCARDIOGRAM (TEE) (N/A)  1 conts to progress well 2 hemodyn stable, some PVC's 3 sugar control is reasonable, no new labs 4 SW work assisting with placement, may need to return to group home if not approved.   LOS: 13 days    Marcus Cummings 06/30/2017 Patient in bathroom No complaints - LSW working on disposition Agree with above assessment  P Donata ClayVan Trigt MD

## 2017-06-30 NOTE — Progress Notes (Signed)
Patient educated for MD order for ambulation x 3 today. Patient asked x 2 since first ambulation this morning. Patient refused x 2. Will continue to encourage patient to ambulate per MD orders.

## 2017-06-30 NOTE — Care Management Note (Signed)
Case Management Note Donn PieriniKristi Moksh Loomer RN, BSN Unit 4E-Case Manager [phone]  Patient Details  Name: Marcus BatheGregory L Lienhard Cummings. MRN: [id number] Date of Birth: 1965/01/31  Subjective/Objective:   [place] with [place], MVD, s/p CABGx3 on 5/28-                  Action/Plan: PTA pt was from Group Home- CSW following for transition either back to group home vs STSNF- pt will need PT/OT evals to help assist with appropriate plan. CM has also [facility] with [facility]- [facility] Dr. Terrilee FilesSalman- CSW for Dr. Terrilee FilesSalman is Marcus Cummings- [phone] ext [phone] pager- [phone]. Per [facility] pt is not 100% service connected for long term placement however could be [facility] if [facility] does not agree pt can return to group home. (if needed would need to fill out paperwork for [facility] and fax prior to 11am for [facility] to review)- April to fax paperwork to have on hand if needed. CSW following for return to group home vs SNF.     Expected Discharge Date:  07/01/17                Expected Discharge Plan:  Group Home  In-House Referral:  Clinical Social Work  Discharge planning Services  CM Consult  Post Acute Care Choice:    Choice offered to:     DME Arranged:    DME Agency:     HH Arranged:    HH Agency:     Status of Service:  In process, will continue to follow  If discussed at Long Length of Stay Meetings, dates discussed:    Discharge Disposition:   Additional Comments:  Darrold SpanWebster, Jameela Michna Hall, RN 06/30/2017, 3:31 PM

## 2017-06-30 NOTE — Clinical Social Work Note (Addendum)
CSW left voicemails for group home director on both extensions provided by unit CSW.  Charlynn CourtSarah Cortlin Marano, CSW [phone]  1:00 pm CSW spoke with group home staff. They want confirmation that patient is independent with ambulation and ADL's prior to returning. Asked RNCM for PT/OT orders. Patient walked 470 feet with cardiac rehab today and is currently not appropriate for SNF. They will also have to come assess him before taking him back. [facility]  Charlynn CourtSarah Ishmel Acevedo, CSW [phone]

## 2017-06-30 NOTE — Progress Notes (Signed)
Patient lying in bed with food tray covered. Patient asked if he needed assistance with tray setup. Patient declined. Patient offered alternate menu option from cafeteria. Patient declined. Patient stated that he did not have an appetite for this meal. SSC insulin held due to scheduled dose and CBG 164. Off-going nurse reported that CBGs have a tendency to fluctuate at times due to missing a meal. Will encourage snack option.

## 2017-07-01 LAB — GLUCOSE, CAPILLARY
GLUCOSE-CAPILLARY: 174 mg/dL — AB (ref 65–99)
GLUCOSE-CAPILLARY: 102 mg/dL — AB (ref 65–99)
Glucose-Capillary: 125 mg/dL — ABNORMAL HIGH (ref 65–99)
Glucose-Capillary: 88 mg/dL (ref 65–99)

## 2017-07-01 MED ORDER — OXYCODONE HCL 5 MG PO TABS: 5.0000 mg | ORAL_TABLET | Freq: Four times a day (QID) | ORAL | 0 refills | Status: AC | PRN

## 2017-07-01 MED ORDER — POTASSIUM CHLORIDE ER 20 MEQ PO TBCR: 20.0000 meq | EXTENDED_RELEASE_TABLET | Freq: Every day | ORAL | 0 refills | Status: DC

## 2017-07-01 MED ORDER — ATORVASTATIN CALCIUM 80 MG PO TABS: 80.0000 mg | ORAL_TABLET | Freq: Every day | ORAL | 1 refills | Status: DC

## 2017-07-01 MED ORDER — ASPIRIN 325 MG PO TBEC: 325.0000 mg | DELAYED_RELEASE_TABLET | Freq: Every day | ORAL | 0 refills | Status: DC

## 2017-07-01 MED ORDER — FUROSEMIDE 40 MG PO TABS: 40.0000 mg | ORAL_TABLET | Freq: Every day | ORAL | 0 refills | Status: DC

## 2017-07-01 MED ORDER — METOPROLOL TARTRATE 50 MG PO TABS: 50.0000 mg | ORAL_TABLET | Freq: Two times a day (BID) | ORAL | 1 refills | Status: DC

## 2017-07-01 NOTE — Progress Notes (Signed)
CARDIAC REHAB PHASE I   PRE:  Rate/Rhythm: 95 SR  BP:  Supine: 100/64  Sitting:   Standing:    SaO2: 98 RA  MODE:  Ambulation: 470 ft   POST:  Rate/Rhythm: 93 SR  BP:  Supine:   Sitting: 120/68   Standing:    SaO2: 100 RA 1400-1425 Assisted x 1 to ambulate. Gait fairly steady. He holds to handrail in hall walking. VS stable Pt back to side of bed after walk with call light in reach.  Melina CopaLisa Annya Lizana RN 07/01/2017 2:24 PM

## 2017-07-01 NOTE — Progress Notes (Addendum)
[address]       [place] [address]             [phone]      8 Days Post-Op Procedure(s) (LRB): CORONARY ARTERY BYPASS GRAFTING (CABG) x 3; Using Left Internal Mammary Artery and Right Leg Greater Saphenous Vein harvested endoscopically. (N/A) TRANSESOPHAGEAL ECHOCARDIOGRAM (TEE) (N/A) Subjective: SW conts to work out disposition Feels well, pretty quiet this am  Objective: Vital signs in last 24 hours: Temp:  [98.3 F (36.8 C)-99.4 F (37.4 C)] 98.3 F (36.8 C) (06/05 0534) Pulse Rate:  [85-104] 104 (06/05 0534) Cardiac Rhythm: Normal sinus rhythm (06/05 0700) Resp:  [19-25] 19 (06/05 0534) BP: (100-124)/(53-84) 100/67 (06/05 0534) SpO2:  [95 %-100 %] 100 % (06/05 0534) Weight:  [87 kg (191 lb 12.8 oz)] 87 kg (191 lb 12.8 oz) (06/05 0534)  Hemodynamic parameters for last 24 hours:    Intake/Output from previous day: 06/04 0701 - 06/05 0700 In: 243 [P.O.:240; I.V.:3] Out: -  Intake/Output this shift: No intake/output data recorded.  General appearance: alert, cooperative and no distress Heart: regular rate and rhythm Lungs: min dim in bases Abdomen: benign Extremities: edema improved Wound: incis healing well  Lab Results: No results for input(s): WBC, HGB, HCT, PLT in the last 72 hours. BMET: No results for input(s): NA, K, CL, CO2, GLUCOSE, BUN, CREATININE, CALCIUM in the last 72 hours.  PT/INR: No results for input(s): LABPROT, INR in the last 72 hours. ABG    Component Value Date/Time   PHART 7.424 06/24/2017 0339   HCO3 21.1 06/24/2017 0339   TCO2 18 (L) 06/25/2017 1716   ACIDBASEDEF 3.0 (H) 06/24/2017 0339   O2SAT 67.5 06/25/2017 0400   CBG (last 3)  Recent Labs    06/30/17 1605 06/30/17 2114 07/01/17 0606  GLUCAP 113* 115* 125*    Meds Scheduled Meds: . aspirin EC  325 mg Oral Daily   Or  . aspirin  324 mg Per Tube Daily  . atorvastatin  80 mg Oral q1800  . bisacodyl  10 mg Oral Daily   Or  . bisacodyl  10  mg Rectal Daily  . Chlorhexidine Gluconate Cloth  6 each Topical Daily  . clonazePAM  1 mg Oral QHS  . docusate sodium  200 mg Oral Daily  . furosemide  40 mg Oral Daily  . glipiZIDE  5 mg Oral QAC breakfast  . insulin aspart  0-24 Units Subcutaneous TID AC & HS  . insulin aspart  5 Units Subcutaneous TID WC  . insulin detemir  15 Units Subcutaneous BID  . metFORMIN  1,000 mg Oral BID WC  . metoprolol tartrate  50 mg Oral BID  . mometasone-formoterol  2 puff Inhalation BID  . pantoprazole  40 mg Oral Daily  . potassium chloride  20 mEq Oral Daily  . sodium chloride flush  10-40 mL Intracatheter Q12H  . sodium chloride flush  3 mL Intravenous Q12H  . sodium chloride flush  3 mL Intravenous Q12H   Continuous Infusions: . sodium chloride Stopped (06/24/17 1114)  . sodium chloride    . sodium chloride Stopped (06/24/17 1116)  . sodium chloride     PRN Meds:.sodium chloride, sodium chloride, magnesium hydroxide, ondansetron (ZOFRAN) IV, oxyCODONE, sodium chloride flush, sodium chloride flush, sodium chloride flush, traMADol  Xrays No results found.  Assessment/Plan: S/P Procedure(s) (LRB): CORONARY ARTERY BYPASS GRAFTING (CABG) x 3; Using Left Internal Mammary Artery and Right Leg Greater Saphenous  Vein harvested endoscopically. (N/A) TRANSESOPHAGEAL ECHOCARDIOGRAM (TEE) (N/A)  1 conts to do well, hemodyn stable in sinus rhythm, RA sats 100% 2 CBG's ok control 3 No other new labs 4 Disposition pending- prob return to group home soon   LOS: 14 days    Rowe ClackWayne E Gold patient examined and medical record reviewed,agree with above note. Kathlee Nationseter Van Trigt III 07/01/2017

## 2017-07-01 NOTE — Evaluation (Signed)
Occupational Therapy Evaluation Patient Details Name: Marcus BatheGregory L Duddy Sr. MRN: [id number] DOB: Dec 26, 1965 Today's Date: 07/01/2017    History of Present Illness Marcus BatheGregory L Corsetti Sr. is a 52 y.o. male with a history of HTN, DM, MI with 2 stents placed in 2007 [facility] hx of stroke in 2017 on plavix, and mood disorder. Admitted with angina; now s/p CABG   Clinical Impression   PTA, pt was living in a group home and was performing BADLs. Pt reports difficulty with LB ADLs and that group home staff assist with some IADLs. Pt requiring Min A for bathing and dressing to adhere to sternal precautions. Providing pt with handout and education on sternal precautions and adherence during ADLs including dressing, bathing, and toileting. Pt verbalizing frustrations reporting he plans to do ADLs how he wants. Reinforced importance of sternal precautions. Pt will require further acute OT to facilitate safe dc. Recommend dc to group home with HHOT for further OT to optimize safety, independence with ADLs, and return to PLOF.     Follow Up Recommendations  Home health OT;Supervision/Assistance - 24 hour    Equipment Recommendations  None recommended by OT    Recommendations for Other Services PT consult     Precautions / Restrictions Precautions Precautions: Sternal Precaution Booklet Issued: Yes (comment) Precaution Comments: Reviewed all sternal precautions and adherance during ADLs. Pt verbalizing frustration with sternal precautions stating "what is the big deal? If I need to do something, I am going to do it the way I want." Restrictions Weight Bearing Restrictions: Yes(sternal precautions)      Mobility Bed Mobility Overal bed mobility: Modified Independent             General bed mobility comments: In recliner upon arrival  Transfers Overall transfer level: Modified independent Equipment used: None             General transfer comment: Educating pt on no pushing or  pulling. continued to push with BUEs to stand    Balance Overall balance assessment: No apparent balance deficits (not formally assessed)                                         ADL either performed or assessed with clinical judgement   ADL Overall ADL's : Needs assistance/impaired Eating/Feeding: Set up;Sitting   Grooming: Set up;Supervision/safety;Standing   Upper Body Bathing: Minimal assistance;Sitting   Lower Body Bathing: Minimal assistance;Sit to/from stand   Upper Body Dressing : Minimal assistance;Standing;Cueing for compensatory techniques;Cueing for safety Upper Body Dressing Details (indicate cue type and reason): Pt donning second gown as jacket with Min A to adhereto sternal precautions Lower Body Dressing: Minimal assistance;Sit to/from stand Lower Body Dressing Details (indicate cue type and reason): Min A to adhere to sternal precautions. Able to bring ankles to knees for donning/doffing socks with cues for sequencing Toilet Transfer: Min Pension scheme managerguard Toilet Transfer Details (indicate cue type and reason): Min Guard for safety   Toileting - Clothing Manipulation Details (indicate cue type and reason): Discussed toilet hygiene techniques for adhereance to sternal precautions. Pt stating " I can't do it that way. It isn't comfortable."     Functional mobility during ADLs: Min guard General ADL Comments: Providing education on sternal precautions during ADLs. Pt will need further educaiton due to decreased cognition and frustrations with education.     Vision  Perception     Praxis      Pertinent Vitals/Pain Pain Assessment: No/denies pain     Hand Dominance Right   Extremity/Trunk Assessment Upper Extremity Assessment Upper Extremity Assessment: Overall WFL for tasks assessed   Lower Extremity Assessment Lower Extremity Assessment: Overall WFL for tasks assessed LLE Deficits / Details: Minimal residual weakness from previous CVA;  lead to assymetric gait pattern   Cervical / Trunk Assessment Cervical / Trunk Assessment: Normal;Other exceptions Cervical / Trunk Exceptions: s/p CABG   Communication Communication Communication: No difficulties   Cognition Arousal/Alertness: Awake/alert Behavior During Therapy: Flat affect Overall Cognitive Status: Within Functional Limits for tasks assessed(Mood disorder and CVA at baseline)                                 General Comments: Pt disagreeable to learning sternal precautions. Verbalizing frustration.   General Comments  VSS    Exercises     Shoulder Instructions      Home Living Family/patient expects to be discharged to:: Group home Living Arrangements: Group Home Available Help at Discharge: Available PRN/intermittently Type of Home: Group Home Home Access: Level entry     Home Layout: One level                   Additional Comments: Group home staff provide meals and bus pass.      Prior Functioning/Environment Level of Independence: Independent        Comments: Pt reporitng he has difficulty PTA with LB ADLs.         OT Problem List: Decreased range of motion;Decreased activity tolerance;Impaired balance (sitting and/or standing);Decreased cognition;Decreased safety awareness;Decreased knowledge of use of DME or AE;Decreased knowledge of precautions      OT Treatment/Interventions: Self-care/ADL training;Therapeutic exercise;Energy conservation;DME and/or AE instruction;Therapeutic activities;Patient/family education    OT Goals(Current goals can be found in the care plan section) Acute Rehab OT Goals Patient Stated Goal: Go home and get out of this place OT Goal Formulation: With patient Time For Goal Achievement: 07/15/17 Potential to Achieve Goals: Good  OT Frequency: Min 3X/week   Barriers to D/C:            Co-evaluation              AM-PAC PT "6 Clicks" Daily Activity     Outcome Measure Help from  another person eating meals?: None Help from another person taking care of personal grooming?: A Little Help from another person toileting, which includes using toliet, bedpan, or urinal?: A Lot Help from another person bathing (including washing, rinsing, drying)?: A Little Help from another person to put on and taking off regular upper body clothing?: A Little Help from another person to put on and taking off regular lower body clothing?: A Little 6 Click Score: 18   End of Session Nurse Communication: Mobility status  Activity Tolerance: Patient tolerated treatment well;Treatment limited secondary to agitation Patient left: in chair;with call bell/phone within reach  OT Visit Diagnosis: Unsteadiness on feet (R26.81);Other abnormalities of gait and mobility (R26.89);Muscle weakness (generalized) (M62.81);Other symptoms and signs involving cognitive function                Time: 1610-9604 OT Time Calculation (min): 17 min Charges:  OT General Charges $OT Visit: 1 Visit OT Evaluation $OT Eval Moderate Complexity: 1 Mod G-Codes:     Marcus Cummings MSOT, OTR/L Acute Rehab Pager: [phone] Office: [phone]  Theodoro Grist Carra Brindley 07/01/2017, 10:21 AM

## 2017-07-01 NOTE — Progress Notes (Addendum)
Clinical Social Worker following patient for support and discharge need. CSW spoke to Otho a Wellsite geologist at [facility]. Cecilie Lowers stated he will be at [facility] to assess patient at 2 pm. Cecilie Lowers stated if patient is medically ready and they are able to take him back could patient come back to group home today. CSW stated she will reach out to medical team to see if patient is medically ready from there stand point.  2:30pm:  CSW and RNCM on the floor met representative Cecilie Lowers at patients bedside. Cecilie Lowers stated at this time they will be unable to take patient back to [facility]. Cecilie Lowers stated that patient needs to be completely independent prior to readmission. Cecilie Lowers stated that patient will need to get out of bed on his own and patient will not be allowed to lay around all day. Patient will also need to be able to do his chores without assistance from other residents. Cecilie Lowers stated that at this time patient is receiving pain medication every 3-4 hours and the facility will be unable to accommodate that since there will be no staff member to give home his medications after 5pm. RNCM stated that at this time patient is medically cleared and will be unable to stay at a hospital level of care. CSW will request a SNF level of care through the [place]. CSW faxed clinicals that was requested by the [facility] in the hopes patient will be placed into a rehab facility.    Rhea Pink, MSW,  [place]

## 2017-07-01 NOTE — Progress Notes (Signed)
Order to discontinue chest tube sutures received. Patient lying recumbent in bed. 2 substernal sutures removed without drainage noted. Patient tolerated well. 3 steri-strips placed to each chest tube suture site.

## 2017-07-01 NOTE — Progress Notes (Signed)
Physical Therapy Treatment Patient Details Name: Marcus BatheGregory L Bender Sr. MRN: [id number] DOB: 07/19/1965 Today's Date: 07/01/2017    History of Present Illness Marcus BatheGregory L Mahajan Sr. is a 52 y.o. male with a history of HTN, DM, MI with 2 stents placed in 2007 [facility] hx of stroke in 2017 on plavix, and mood disorder. Admitted with angina; now s/p CABG    PT Comments    Patient making steady progress towards PT goals. Current POC remains appropriate.   Follow Up Recommendations  No PT follow up     Equipment Recommendations  None recommended by PT    Recommendations for Other Services OT consult     Precautions / Restrictions Precautions Precautions: Sternal Precaution Booklet Issued: No Precaution Comments: Marcus Cummings was not open to further education after walking; noted Cardiac Rehab is working with him also with continuing efforts at education    Mobility  Bed Mobility Overal bed mobility: Modified Independent                Transfers Overall transfer level: Modified independent Equipment used: None             General transfer comment: NO difficulty with rise, no need for UE push or steadying  Ambulation/Gait Ambulation/Gait assistance: Supervision Ambulation Distance (Feet): 500 Feet Assistive device: None Gait Pattern/deviations: Step-through pattern Gait velocity: approaching WNL   General Gait Details: baseline circumduction LLE due to prior stroke, decreased cadence.   Stairs             Wheelchair Mobility    Modified Rankin (Stroke Patients Only)       Balance Overall balance assessment: No apparent balance deficits (not formally assessed)                                          Cognition Arousal/Alertness: Awake/alert Behavior During Therapy: Flat affect Overall Cognitive Status: Within Functional Limits for tasks assessed(for simple mobiltiy tasks)                                 General  Comments: Per RN, pt feeling down today, disappointed that he isn't going home today      Exercises      General Comments General comments (skin integrity, edema, etc.): VSS HR 119 peak, saturations 98%      Pertinent Vitals/Pain Pain Assessment: No/denies pain    Home Living                      Prior Function            PT Goals (current goals can now be found in the care plan section) Acute Rehab PT Goals Patient Stated Goal: HOpes to be home soon PT Goal Formulation: With patient Time For Goal Achievement: 07/07/17(will likely meet goal after 1 session) Potential to Achieve Goals: Good Progress towards PT goals: Progressing toward goals    Frequency    Min 3X/week(Will llikely meet goals next session)      PT Plan Current plan remains appropriate    Co-evaluation              AM-PAC PT "6 Clicks" Daily Activity  Outcome Measure  Difficulty turning over in bed (including adjusting bedclothes, sheets and blankets)?: None Difficulty moving from lying on back to sitting  on the side of the bed? : None Difficulty sitting down on and standing up from a chair with arms (e.g., wheelchair, bedside commode, etc,.)?: None Help needed moving to and from a bed to chair (including a wheelchair)?: None Help needed walking in hospital room?: None Help needed climbing 3-5 steps with a railing? : None 6 Click Score: 24    End of Session   Activity Tolerance: Patient tolerated treatment well Patient left: in chair;with call bell/phone within reach Nurse Communication: Mobility status PT Visit Diagnosis: Other abnormalities of gait and mobility (R26.89)     Time: 1610-9604 PT Time Calculation (min) (ACUTE ONLY): 15 min  Charges:  $Gait Training: 8-22 mins                    G Codes:       Charlotte Crumb, PT DPT  Board Certified Neurologic Specialist [phone]    Fabio Asa 07/01/2017, 8:29 AM

## 2017-07-02 LAB — DRUG SCREEN 10 W/CONF, SERUM
AMPHETAMINES, IA: NEGATIVE ng/mL
Barbiturates, IA: NEGATIVE ug/mL
Benzodiazepines, IA: POSITIVE ng/mL
COCAINE & METABOLITE, IA: NEGATIVE ng/mL
METHADONE, IA: NEGATIVE ng/mL
Opiates, IA: NEGATIVE ng/mL
Oxycodones, IA: NEGATIVE ng/mL
PROPOXYPHENE, IA: NEGATIVE ng/mL
Phencyclidine, IA: NEGATIVE ng/mL
THC(Marijuana) Metabolite, IA: NEGATIVE ng/mL

## 2017-07-02 LAB — BENZODIAZEPINES,MS,WB/SP RFX
7-Aminoclonazepam: NEGATIVE ng/mL
Alprazolam: NEGATIVE ng/mL
BENZODIAZEPINES CONFIRM: POSITIVE
CHLORDIAZEPOXIDE: NEGATIVE ng/mL
Clonazepam: NEGATIVE ng/mL
DESMETHYLCHLORDIAZEPOXIDE: NEGATIVE ng/mL
DESMETHYLDIAZEPAM: NEGATIVE ng/mL
DIAZEPAM: 22 ng/mL
Desalkylflurazepam: NEGATIVE ng/mL
Flurazepam: NEGATIVE ng/mL
LORAZEPAM: NEGATIVE ng/mL
Midazolam: 23 ng/mL
OXAZEPAM: NEGATIVE ng/mL
Temazepam: NEGATIVE ng/mL
Triazolam: NEGATIVE ng/mL

## 2017-07-02 LAB — GLUCOSE, CAPILLARY
GLUCOSE-CAPILLARY: 107 mg/dL — AB (ref 65–99)
GLUCOSE-CAPILLARY: 162 mg/dL — AB (ref 65–99)
Glucose-Capillary: 101 mg/dL — ABNORMAL HIGH (ref 65–99)
Glucose-Capillary: 129 mg/dL — ABNORMAL HIGH (ref 65–99)

## 2017-07-02 NOTE — Progress Notes (Signed)
Initial Nutrition Assessment  DOCUMENTATION CODES:   Not applicable  INTERVENTION:    Continue Heart Healthy/Carbohydrate Modified diet. Pt declined nutrition supplements.  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   Pt s/p procedure 5/28: CORONARY ARTERY BYPASS GRAFTING  Pt with flat affect upon RD visit. Sitting in his recliner. Lunch tray untouched.  Reports his appetite is "okay". PO intake variable at 0-75% per flowsheet records. He typically consumes 2 meals per day. Noted pt lives at a group home for veterans.  Diet recall: Breakfast: grits, eggs, sausage Lunch: chicken, potatoes, vegetable   Pt denies any recent unintentional weight loss. Labs and medications reviewed. CBG's [id number].  NUTRITION - FOCUSED PHYSICAL EXAM:  Completed. No muslce or fat depletions noticed.  Diet Order:   Diet Order           Diet heart healthy/carb modified Room service appropriate? Yes; Fluid consistency: Thin  Diet effective now         EDUCATION NEEDS:   No education needs have been identified at this time  Skin:  Skin Assessment: Reviewed RN Assessment  Last BM:  6/3    Intake/Output Summary (Last 24 hours) at 07/02/2017 1628 Last data filed at 07/02/2017 1201 Gross per 24 hour  Intake 843 ml  Output -  Net 843 ml   Height:   Ht Readings from Last 1 Encounters:  06/18/17 6' (1.829 m)   Weight:   Wt Readings from Last 1 Encounters:  07/02/17 190 lb 3.2 oz (86.3 kg)   BMI:  Body mass index is 25.8 kg/m.  Estimated Nutritional Needs:   Kcal:   2100-2300  Protein:  105-120 gm  Fluid:  2.2-2.3 L  Maureen ChattersKatie Shaquita Fort, RD, LDN Pager #: [phone] After-Hours Pager #: [phone]

## 2017-07-02 NOTE — Progress Notes (Signed)
Patient not compliant with sternal precautions, sternal precautions demonstrated to the patient, he verbalized understanding, will continue to monitor and reinforce teachings

## 2017-07-02 NOTE — NC FL2 (Signed)
[address] MEDICAID FL2 LEVEL OF CARE SCREENING TOOL     IDENTIFICATION  Patient Name: Marcus BatheGregory L Riedlinger Sr. Birthdate: Oct 22, 1965 Sex: male Admission Date (Current Location): 06/17/2017  [facility] [place]   Facility and Address:  [facility] [place], [address], [place], [place] [address]      Provider Number: [id number]  Attending Physician Name and Address:  Kerin PernaVan Trigt, Peter, MD  Relative Name and Phone Number:  Zenaida NieceZanneta Rueth, [phone]    Current Level of Care: Hospital Recommended Level of Care: Skilled Nursing Facility Prior Approval Number:    Date Approved/Denied:   PASRR Number: [id number]  Discharge Plan: SNF    Current Diagnoses: Patient Active Problem List   Diagnosis Date Noted  . 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 Essential hypertension   . Dyslipidemia   . Chest pain on exertion   . Stable angina (HCC) 06/17/2017    Orientation RESPIRATION BLADDER Height & Weight     Self, Time, Situation, Place  Normal Continent Weight: 190 lb 3.2 oz (86.3 kg) Height:  6' (182.9 cm)  BEHAVIORAL SYMPTOMS/MOOD NEUROLOGICAL BOWEL NUTRITION STATUS      Continent Diet(heart healthy/carb modified)  AMBULATORY STATUS COMMUNICATION OF NEEDS Skin   Limited Assist Verbally Surgical wounds(pt had a cabgx3)                       Personal Care Assistance Level of Assistance  Bathing, Feeding, Dressing Bathing Assistance: Limited assistance Feeding assistance: Independent Dressing Assistance: Limited assistance     Functional Limitations Info  Sight, Hearing, Speech Sight Info: Adequate Hearing Info: Adequate Speech Info: Adequate    SPECIAL CARE FACTORS FREQUENCY  OT (By licensed OT)       OT Frequency: 5x wk            Contractures Contractures Info: Not present    Additional Factors Info  Code Status, Allergies Code Status Info: full code Allergies Info: no known allergies           Current Medications  (07/02/2017):  This is the current hospital active medication list Current Facility-Administered Medications  Medication Dose Route Frequency Provider Last Rate Last Dose  . 0.45 % sodium chloride infusion   Intravenous Continuous PRN Sharlene DoryConte, Tessa N, PA-C   Stopped at 06/24/17 1114  . 0.9 %  sodium chloride infusion  250 mL Intravenous Continuous Conte, Tessa N, PA-C      . 0.9 %  sodium chloride infusion   Intravenous Continuous Sharlene DoryConte, Tessa N, PA-C   Stopped at 06/24/17 1116  . 0.9 %  sodium chloride infusion  250 mL Intravenous PRN Kerin PernaVan Trigt, Peter, MD      . aspirin EC tablet 325 mg  325 mg Oral Daily Jari FavreConte, Tessa N, [place]   325 mg at 07/02/17 [id number]   Or  . aspirin chewable tablet 324 mg  324 mg Per Tube Daily Conte, Tessa N, PA-C      . atorvastatin (LIPITOR) tablet 80 mg  80 mg Oral q1800 Conte, Tessa N, PA-C   80 mg at 07/02/17 1729  . bisacodyl (DULCOLAX) EC tablet 10 mg  10 mg Oral Daily Jari FavreConte, Tessa N, PA-C   10 mg at 06/25/17 1043   Or  . bisacodyl (DULCOLAX) suppository 10 mg  10 mg Rectal Daily Asa Lenteonte, Tessa N, PA-C      . Chlorhexidine Gluconate Cloth 2 % PADS 6 each  6 each Topical Daily  Kerin Perna, MD   6 each at 07/01/17 [phone]  . clonazePAM (KLONOPIN) tablet 1 mg  1 mg Oral QHS Donata Clay, Theron Arista, MD   1 mg at 07/01/17 2153  . docusate sodium (COLACE) capsule 200 mg  200 mg Oral Daily Jari Favre N, PA-C   200 mg at 06/29/17 1028  . furosemide (LASIX) tablet 40 mg  40 mg Oral Daily Donata Clay, Theron Arista, MD   40 mg at 07/02/17 0851  . glipiZIDE (GLUCOTROL) tablet 5 mg  5 mg Oral QAC breakfast Gold, Wayne E, PA-C   5 mg at 07/02/17 1308  . insulin aspart (novoLOG) injection 0-24 Units  0-24 Units Subcutaneous TID AC & HS Kerin Perna, MD   4 Units at 07/01/17 1226  . insulin aspart (novoLOG) injection 5 Units  5 Units Subcutaneous TID WC Jari Favre N, PA-C   5 Units at 06/30/17 1712  . insulin detemir (LEVEMIR) injection 15 Units  15 Units Subcutaneous BID Sharlene Dory, [place]    15 Units at 07/02/17 1159  . magnesium hydroxide (MILK OF MAGNESIA) suspension 30 mL  30 mL Oral Daily PRN Donata Clay, Theron Arista, MD      . metFORMIN (GLUCOPHAGE) tablet 1,000 mg  1,000 mg Oral BID WC Gold, Wayne E, PA-C   1,000 mg at 07/02/17 6578  . metoprolol tartrate (LOPRESSOR) tablet 50 mg  50 mg Oral BID Lennette Bihari, MD   50 mg at 07/02/17 4696  . mometasone-formoterol (DULERA) 200-5 MCG/ACT inhaler 2 puff  2 puff Inhalation BID Donata Clay, Theron Arista, MD   2 puff at 07/02/17 0845  . ondansetron (ZOFRAN) injection 4 mg  4 mg Intravenous Q6H PRN Asa Lente, Tessa N, PA-C      . oxyCODONE (Oxy IR/ROXICODONE) immediate release tablet 5-10 mg  5-10 mg Oral Q3H PRN Asa Lente, Tessa N, PA-C   10 mg at 07/01/17 1549  . pantoprazole (PROTONIX) EC tablet 40 mg  40 mg Oral Daily Sharlene Dory, PA-C   40 mg at 07/02/17 2952  . potassium chloride SA (K-DUR,KLOR-CON) CR tablet 20 mEq  20 mEq Oral Daily Donata Clay, Theron Arista, MD   20 mEq at 07/02/17 [phone]  . sodium chloride flush (NS) 0.9 % injection 10-40 mL  10-40 mL Intracatheter Q12H Donata Clay, Theron Arista, MD   10 mL at 07/01/17 0844  . sodium chloride flush (NS) 0.9 % injection 10-40 mL  10-40 mL Intracatheter PRN Donata Clay, Theron Arista, MD      . sodium chloride flush (NS) 0.9 % injection 3 mL  3 mL Intravenous Q12H Conte, Tessa N, PA-C   3 mL at 07/02/17 1201  . sodium chloride flush (NS) 0.9 % injection 3 mL  3 mL Intravenous PRN Asa Lente, Tessa N, PA-C      . sodium chloride flush (NS) 0.9 % injection 3 mL  3 mL Intravenous Q12H Donata Clay, Theron Arista, MD   3 mL at 07/02/17 1201  . sodium chloride flush (NS) 0.9 % injection 3 mL  3 mL Intravenous PRN Donata Clay, Theron Arista, MD      . traMADol Janean Sark) tablet 50-100 mg  50-100 mg Oral Q4H PRN Asa Lente, Tessa N, PA-C   100 mg at 06/26/17 1427     Discharge Medications: Please see discharge summary for a list of discharge medications.  Relevant Imaging Results:  Relevant Lab Results:   Additional Information SS# [id number]  Althea Charon, LCSW

## 2017-07-02 NOTE — Progress Notes (Addendum)
[address]       [place] [address]             [phone]      9 Days Post-Op Procedure(s) (LRB): CORONARY ARTERY BYPASS GRAFTING (CABG) x 3; Using Left Internal Mammary Artery and Right Leg Greater Saphenous Vein harvested endoscopically. (N/A) TRANSESOPHAGEAL ECHOCARDIOGRAM (TEE) (N/A) Subjective: Feels fine  Objective: Vital signs in last 24 hours: Temp:  [97.8 F (36.6 C)-99.1 F (37.3 C)] 97.8 F (36.6 C) (06/06 0435) Pulse Rate:  [83-96] 83 (06/06 0435) Cardiac Rhythm: Normal sinus rhythm (06/06 0700) Resp:  [17-20] 17 (06/06 0435) BP: (105-123)/(75-86) 123/86 (06/06 0435) SpO2:  [96 %-100 %] 99 % (06/06 0435) Weight:  [86.3 kg (190 lb 3.2 oz)] 86.3 kg (190 lb 3.2 oz) (06/06 0434)  Hemodynamic parameters for last 24 hours:    Intake/Output from previous day: 06/05 0701 - 06/06 0700 In: 600 [P.O.:600] Out: -  Intake/Output this shift: No intake/output data recorded.  General appearance: alert, cooperative and no distress Heart: regular rate and rhythm Lungs: clear to auscultation bilaterally Abdomen: benign Extremities: min edema Wound: incis healing well  Lab Results: No results for input(s): WBC, HGB, HCT, PLT in the last 72 hours. BMET: No results for input(s): NA, K, CL, CO2, GLUCOSE, BUN, CREATININE, CALCIUM in the last 72 hours.  PT/INR: No results for input(s): LABPROT, INR in the last 72 hours. ABG    Component Value Date/Time   PHART 7.424 06/24/2017 0339   HCO3 21.1 06/24/2017 0339   TCO2 18 (L) 06/25/2017 1716   ACIDBASEDEF 3.0 (H) 06/24/2017 0339   O2SAT 67.5 06/25/2017 0400   CBG (last 3)  Recent Labs    07/01/17 1627 07/01/17 2053 07/02/17 0612  GLUCAP 102* 88 107*    Meds Scheduled Meds: . aspirin EC  325 mg Oral Daily   Or  . aspirin  324 mg Per Tube Daily  . atorvastatin  80 mg Oral q1800  . bisacodyl  10 mg Oral Daily   Or  . bisacodyl  10 mg Rectal Daily  . Chlorhexidine Gluconate Cloth  6  each Topical Daily  . clonazePAM  1 mg Oral QHS  . docusate sodium  200 mg Oral Daily  . furosemide  40 mg Oral Daily  . glipiZIDE  5 mg Oral QAC breakfast  . insulin aspart  0-24 Units Subcutaneous TID AC & HS  . insulin aspart  5 Units Subcutaneous TID WC  . insulin detemir  15 Units Subcutaneous BID  . metFORMIN  1,000 mg Oral BID WC  . metoprolol tartrate  50 mg Oral BID  . mometasone-formoterol  2 puff Inhalation BID  . pantoprazole  40 mg Oral Daily  . potassium chloride  20 mEq Oral Daily  . sodium chloride flush  10-40 mL Intracatheter Q12H  . sodium chloride flush  3 mL Intravenous Q12H  . sodium chloride flush  3 mL Intravenous Q12H   Continuous Infusions: . sodium chloride Stopped (06/24/17 1114)  . sodium chloride    . sodium chloride Stopped (06/24/17 1116)  . sodium chloride     PRN Meds:.sodium chloride, sodium chloride, magnesium hydroxide, ondansetron (ZOFRAN) IV, oxyCODONE, sodium chloride flush, sodium chloride flush, sodium chloride flush, traMADol  Xrays No results found.  Assessment/Plan: S/P Procedure(s) (LRB): CORONARY ARTERY BYPASS GRAFTING (CABG) x 3; Using Left Internal Mammary Artery and Right Leg Greater Saphenous Vein harvested endoscopically. (N/A) TRANSESOPHAGEAL ECHOCARDIOGRAM (TEE) (N/A)  1 doing  well 2 disposition pending , not quite ready on evaluation for group home setting. SW assisting 3 CBG's ok 4 hemodyn stable, short episode of sinus tachy 5 cont routine pulm toilet and cardiac rehab    LOS: 15 days    Marcus Cummings 6/6/2019patient doing well    Cont current care until disposition is finalized patient examined and medical record reviewed,agree with above note. Kathlee Nationseter Van Trigt III 07/02/2017

## 2017-07-02 NOTE — Progress Notes (Signed)
Clinical Social Worker following patient for support and discharge needs. Clinical information [facility] [place] requesting short term rehab at their facility. CSW will follow up with the [facility] in the morning to see if they will be able to offer patient a bed.   Marrianne MoodAshley Zyanna Leisinger, MSW,  [employer] [phone]

## 2017-07-02 NOTE — Progress Notes (Signed)
1130 Patient walked with RN this morning will follow up with patient later.Marcus LighterMaria Deziray Nabi, RN,BSN 07/02/2017 11:35 AM

## 2017-07-02 NOTE — Progress Notes (Signed)
Patient did not eat any meal today saying ''I do not like hospital foods'', patient had glucerna once at lunch , refusing glucerna now, patient encouraged to eat or take glucerna, will continue to monitor.

## 2017-07-03 LAB — GLUCOSE, CAPILLARY
GLUCOSE-CAPILLARY: 115 mg/dL — AB (ref 65–99)
Glucose-Capillary: 86 mg/dL (ref 65–99)
Glucose-Capillary: 92 mg/dL (ref 65–99)

## 2017-07-03 NOTE — Clinical Social Work Note (Signed)
VA is just now getting patient's referral through the printer and will not be able to review referral until Monday. CSW discussed with patient. Patient and his sister are agreeable to [facility] with 7-day LOG. Hospital liaison notified and will come do paperwork with patient. CSW paged PA.  Charlynn CourtSarah Kamrynn Melott, CSW [phone]

## 2017-07-03 NOTE — Progress Notes (Addendum)
Patient refusing meals he said he does not like hospital foods, has a flat affect, this RN asked the patient if he has any concerns, feels sad or has thoughts of harming himslef or others, he smiled and stated that '' I am fine, people always think I am sad because I don't talk a whole lot'', he denied thoughts of harming himself or others, patient given  some cereal and milk as requested by him, will continue to monitor.

## 2017-07-03 NOTE — Progress Notes (Addendum)
[address]       [place] [address]             [phone]      10 Days Post-Op Procedure(s) (LRB): CORONARY ARTERY BYPASS GRAFTING (CABG) x 3; Using Left Internal Mammary Artery and Right Leg Greater Saphenous Vein harvested endoscopically. (N/A) TRANSESOPHAGEAL ECHOCARDIOGRAM (TEE) (N/A) Subjective: Feels fine  Objective: Vital signs in last 24 hours: Temp:  [98.4 F (36.9 C)-99.2 F (37.3 C)] 98.5 F (36.9 C) (06/07 0226) Pulse Rate:  [84-93] 90 (06/06 2237) Cardiac Rhythm: Normal sinus rhythm (06/06 2245) Resp:  [16-25] 18 (06/07 0634) BP: (92-127)/(62-85) 92/62 (06/07 0226) SpO2:  [98 %-100 %] 98 % (06/07 0226) Weight:  [86 kg (189 lb 9.5 oz)] 86 kg (189 lb 9.5 oz) (06/07 0634)  Hemodynamic parameters for last 24 hours:    Intake/Output from previous day: 06/06 0701 - 06/07 0700 In: 726 [P.O.:720; I.V.:6] Out: 450 [Urine:450] Intake/Output this shift: No intake/output data recorded.  General appearance: alert, cooperative and no distress Heart: regular rate and rhythm Lungs: clear to auscultation bilaterally Abdomen: benign Extremities: min edema Wound: incis healing well  Lab Results: No results for input(s): WBC, HGB, HCT, PLT in the last 72 hours. BMET: No results for input(s): NA, K, CL, CO2, GLUCOSE, BUN, CREATININE, CALCIUM in the last 72 hours.  PT/INR: No results for input(s): LABPROT, INR in the last 72 hours. ABG    Component Value Date/Time   PHART 7.424 06/24/2017 0339   HCO3 21.1 06/24/2017 0339   TCO2 18 (L) 06/25/2017 1716   ACIDBASEDEF 3.0 (H) 06/24/2017 0339   O2SAT 67.5 06/25/2017 0400   CBG (last 3)  Recent Labs    07/02/17 1108 07/02/17 1620 07/02/17 2200  GLUCAP 101* 129* 162*    Meds Scheduled Meds: . aspirin EC  325 mg Oral Daily   Or  . aspirin  324 mg Per Tube Daily  . atorvastatin  80 mg Oral q1800  . bisacodyl  10 mg Oral Daily   Or  . bisacodyl  10 mg Rectal Daily  . Chlorhexidine  Gluconate Cloth  6 each Topical Daily  . clonazePAM  1 mg Oral QHS  . docusate sodium  200 mg Oral Daily  . furosemide  40 mg Oral Daily  . glipiZIDE  5 mg Oral QAC breakfast  . insulin aspart  0-24 Units Subcutaneous TID AC & HS  . insulin aspart  5 Units Subcutaneous TID WC  . insulin detemir  15 Units Subcutaneous BID  . metFORMIN  1,000 mg Oral BID WC  . metoprolol tartrate  50 mg Oral BID  . mometasone-formoterol  2 puff Inhalation BID  . pantoprazole  40 mg Oral Daily  . potassium chloride  20 mEq Oral Daily  . sodium chloride flush  10-40 mL Intracatheter Q12H  . sodium chloride flush  3 mL Intravenous Q12H  . sodium chloride flush  3 mL Intravenous Q12H   Continuous Infusions: . sodium chloride Stopped (06/24/17 1114)  . sodium chloride    . sodium chloride Stopped (06/24/17 1116)  . sodium chloride     PRN Meds:.sodium chloride, sodium chloride, magnesium hydroxide, ondansetron (ZOFRAN) IV, oxyCODONE, sodium chloride flush, sodium chloride flush, sodium chloride flush, traMADol  Xrays No results found.  Assessment/Plan: S/P Procedure(s) (LRB): CORONARY ARTERY BYPASS GRAFTING (CABG) x 3; Using Left Internal Mammary Artery and Right Leg Greater Saphenous Vein harvested endoscopically. (N/A) TRANSESOPHAGEAL ECHOCARDIOGRAM (TEE) (N/A)  1  doing well  2 will stop lasix at this point 3 BS control is good 4 awaits disposition, SW assisting   LOS: 16 days    Rowe ClackWayne E Gold patient examined and medical record reviewed,agree with above note. Kathlee Nationseter Van Trigt III 07/03/2017

## 2017-07-03 NOTE — Clinical Social Work Note (Signed)
CSW facilitated patient discharge including contacting patient family and facility to confirm patient discharge plans. Clinical information faxed to facility and family agreeable with plan. CSW arranged ambulance transport via PTAR to [facility]. RN to call report prior to discharge [phone] Room 119A).  CSW will sign off for now as social work intervention is no longer needed. Please consult us again if new needs arise.  Charlynn CourtSarah Hoyle Barkdull, CSW [phone]

## 2017-07-03 NOTE — Progress Notes (Signed)
Offered to walk pt. Pt declining stating he had just walked before lunch. Pt states he's ready to get out of the hospital. Encouraged pt to walk 2 more times today.   Marcus Cummings  [name], RN BSN 07/03/2017 1:56 PM

## 2017-07-03 NOTE — Progress Notes (Signed)
Physical Therapy Treatment Patient Details Name: Marcus BatheGregory L Bubeck Sr. MRN: [id number] DOB: 1965/07/29 Today's Date: 07/03/2017    History of Present Illness Marcus BatheGregory L Wendt Sr. is a 52 y.o. male with a history of HTN, DM, MI with 2 stents placed in 2007 [facility] [facility]), hx of stroke in 2017 on plavix, and mood disorder. Admitted with angina; now s/p CABG    PT Comments    Pt making good progress.    Follow Up Recommendations  No PT follow up     Equipment Recommendations  None recommended by PT    Recommendations for Other Services       Precautions / Restrictions Precautions Precautions: Sternal Precaution Booklet Issued: Yes (comment)    Mobility  Bed Mobility Overal bed mobility: Modified Independent                Transfers Overall transfer level: Modified independent Equipment used: None             General transfer comment: Pt able to perform and adhere to sternal precautions  Ambulation/Gait Ambulation/Gait assistance: Supervision Ambulation Distance (Feet): 500 Feet Assistive device: None Gait Pattern/deviations: Step-through pattern   Gait velocity interpretation: 1.31 - 2.62 ft/sec, indicative of limited community ambulator General Gait Details: baseline circumduction LLE due to prior stroke, decreased cadence.   Stairs             Wheelchair Mobility    Modified Rankin (Stroke Patients Only)       Balance Overall balance assessment: No apparent balance deficits (not formally assessed)                                          Cognition Arousal/Alertness: Awake/alert Behavior During Therapy: Flat affect Overall Cognitive Status: Within Functional Limits for tasks assessed                                        Exercises      General Comments        Pertinent Vitals/Pain Pain Assessment: No/denies pain    Home Living                      Prior Function            PT  Goals (current goals can now be found in the care plan section) Progress towards PT goals: Progressing toward goals    Frequency    Min 3X/week      PT Plan Current plan remains appropriate    Co-evaluation              AM-PAC PT "6 Clicks" Daily Activity  Outcome Measure  Difficulty turning over in bed (including adjusting bedclothes, sheets and blankets)?: None Difficulty moving from lying on back to sitting on the side of the bed? : None Difficulty sitting down on and standing up from a chair with arms (e.g., wheelchair, bedside commode, etc,.)?: None Help needed moving to and from a bed to chair (including a wheelchair)?: None Help needed walking in hospital room?: None Help needed climbing 3-5 steps with a railing? : A Little 6 Click Score: 23    End of Session   Activity Tolerance: Patient tolerated treatment well Patient left: with call bell/phone within reach;in bed Nurse Communication: Mobility status  PT Visit Diagnosis: Other abnormalities of gait and mobility (R26.89)     Time: 1610-9604 PT Time Calculation (min) (ACUTE ONLY): 13 min  Charges:  $Gait Training: 8-22 mins                    G Codes:       [facility] PT [phone]    Angelina Ok [facility] 07/03/2017, 4:29 PM

## 2017-07-03 NOTE — Progress Notes (Signed)
Patient in a stable condition, discharge education reviewed with patient at bedside, he verbalized understanding, report call to nurse Rinaldo CloudPamela at [facility], patient belongings at bedside,tele dc CCMD notified, [facility]

## 2017-07-03 NOTE — Progress Notes (Signed)
Patient given a sandwich to eat since he did not eat from his dinner tray. Patient consumed less than 50% of it. Will continue to monitor eating habits. Marcus Cummings Samad, RN

## 2017-07-03 NOTE — Care Management Note (Signed)
Case Management Note Donn PieriniKristi Zakariya Knickerbocker RN, BSN Unit 4E-Case Manager [phone]  Patient Details  Name: Velvet BatheGregory L Klaus Sr. MRN: [id number] Date of Birth: 08/31/1965  Subjective/Objective:   Pt admitted with [place], MVD, s/p CABGx3 on 5/28-                  Action/Plan: PTA pt was from Group Home- CSW following for transition either back to group home vs STSNF- pt will need PT/OT evals to help assist with appropriate plan. CM has also spoken with [facility] at [facility]- [facility] Dr. Terrilee FilesSalman- CSW for Dr. Terrilee FilesSalman is Idelia SalmMarlon Nolen- [phone]- pager- [phone]. [facility] pt is not 100% service connected for long term placement however could be assessed for [facility] STSNF if group home does not agree pt can return to group home. (if needed would need to fill out paperwork for [facility] and fax prior to 11am for [facility] to review)- April to fax paperwork to have on hand if needed. CSW following for return to group home vs SNF.     Expected Discharge Date:  07/01/17                Expected Discharge Plan:  Skilled Nursing Facility  In-House Referral:  Clinical Social Work  Discharge planning Services  CM Consult  Post Acute Care Choice:    Choice offered to:     DME Arranged:    DME Agency:     HH Arranged:    HH Agency:     Status of Service:  Completed, signed off  If discussed at [employer] Length of Stay Meetings, dates discussed:    Discharge Disposition: skilled facility   Additional Comments:  07/04/27- 1630- Niang Mitcheltree RN, CM- pt for d/c today- group home wants pt to be closer to baseline with independence were not agreeable for pt to return to group home yet however will save pt's bed. [place] [facility] [place] [facility] however process hindered by them not receiving fax in time for them to review it until Monday. Pt has been given approval for 7 day LOG with OT- CSW working on placement with LOG-   Zenda AlpersWebster,  Lenn SinkKristi Hall, RN 07/03/2017, 4:32 PM

## 2017-07-03 NOTE — Progress Notes (Signed)
Patient ambulated 87400ft in the hall with a standby assist, ambulation well tolerated.

## 2017-07-03 NOTE — Clinical Social Work Placement (Signed)
   CLINICAL SOCIAL WORK PLACEMENT  NOTE  Date:  07/03/2017  Patient Details  Name: Velvet BatheGregory L Streiff Sr. MRN: [id number] Date of Birth: Nov 09, 1965  Clinical Social Work is seeking post-discharge placement for this patient at the Skilled  Nursing Facility level of care (*CSW will initial, date and re-position this form in  chart as items are completed):  Yes   Patient/family provided with [place] Clinical Social Work Department's list of facilities offering this level of care within the geographic area requested by the patient (or if unable, by the patient's family).  Yes   Patient/family informed of their freedom to choose among providers that offer the needed level of care, that participate in Medicare, Medicaid or managed care program needed by the patient, have an available bed and are willing to accept the patient.  Yes   Patient/family informed of [facility]'s ownership interest in [facility] and [facility], as well as of the fact that they are under no obligation to receive care at these facilities.  PASRR submitted to EDS on 07/02/17     PASRR number received on 07/02/17     Existing PASRR number confirmed on       FL2 transmitted to all facilities in geographic area requested by pt/family on 07/02/17     FL2 transmitted to all facilities within larger geographic area on       Patient informed that his/her managed care company has contracts with or will negotiate with certain facilities, including the following:        Yes   Patient/family informed of bed offers received.  [facility])     Physician recommends and patient chooses bed at      [facility] on 07/03/17.  Patient to be transferred to facility by PTAR     Patient family notified on 07/03/17 of transfer.  Name of family member notified:  Lianne BushyZannetta Rosol      PHYSICIAN       Additional Comment:    _______________________________________________ Margarito LinerSarah C Tia Gelb, LCSW 07/03/2017, 4:41 PM

## 2017-07-13 ENCOUNTER — Ambulatory Visit: Payer: Non-veteran care | Admitting: Cardiology

## 2017-07-14 ENCOUNTER — Encounter: Payer: Self-pay | Admitting: Cardiology

## 2017-07-24 ENCOUNTER — Other Ambulatory Visit: Payer: Self-pay | Admitting: Cardiothoracic Surgery

## 2017-07-24 DIAGNOSIS — Z951 Presence of aortocoronary bypass graft: Secondary | ICD-10-CM

## 2017-07-29 ENCOUNTER — Encounter: Payer: Non-veteran care | Admitting: Cardiothoracic Surgery

## 2017-08-21 ENCOUNTER — Telehealth (HOSPITAL_COMMUNITY): Payer: Self-pay

## 2017-08-21 NOTE — Telephone Encounter (Signed)
Attempted to follow up with patient about SNF - cannot leave vm. Sending letter.

## 2017-09-29 ENCOUNTER — Encounter (HOSPITAL_COMMUNITY): Payer: Self-pay | Admitting: Emergency Medicine

## 2017-09-29 ENCOUNTER — Emergency Department (HOSPITAL_COMMUNITY)
Admission: EM | Admit: 2017-09-29 | Discharge: 2017-09-30 | Disposition: A | Discharge status: Other Acute Inpatient Hospital | Payer: Non-veteran care | Attending: Emergency Medicine | Admitting: Emergency Medicine

## 2017-09-29 ENCOUNTER — Emergency Department (HOSPITAL_COMMUNITY): Payer: Non-veteran care

## 2017-09-29 DIAGNOSIS — Z79899 Other long term (current) drug therapy: Secondary | ICD-10-CM | POA: Diagnosis not present

## 2017-09-29 DIAGNOSIS — F332 Major depressive disorder, recurrent severe without psychotic features: Secondary | ICD-10-CM | POA: Diagnosis present

## 2017-09-29 DIAGNOSIS — R079 Chest pain, unspecified: Secondary | ICD-10-CM

## 2017-09-29 DIAGNOSIS — F4310 Post-traumatic stress disorder, unspecified: Secondary | ICD-10-CM | POA: Insufficient documentation

## 2017-09-29 DIAGNOSIS — E119 Type 2 diabetes mellitus without complications: Secondary | ICD-10-CM | POA: Insufficient documentation

## 2017-09-29 DIAGNOSIS — Z794 Long term (current) use of insulin: Secondary | ICD-10-CM | POA: Insufficient documentation

## 2017-09-29 DIAGNOSIS — Z7982 Long term (current) use of aspirin: Secondary | ICD-10-CM | POA: Insufficient documentation

## 2017-09-29 DIAGNOSIS — R45851 Suicidal ideations: Secondary | ICD-10-CM | POA: Insufficient documentation

## 2017-09-29 DIAGNOSIS — I10 Essential (primary) hypertension: Secondary | ICD-10-CM | POA: Insufficient documentation

## 2017-09-29 LAB — CBC
HCT: 41.2 % (ref 39.0–52.0)
Hemoglobin: 13.6 g/dL (ref 13.0–17.0)
MCH: 28.6 pg (ref 26.0–34.0)
MCHC: 33 g/dL (ref 30.0–36.0)
MCV: 86.7 fL (ref 78.0–100.0)
PLATELETS: 222 10*3/uL (ref 150–400)
RBC: 4.75 MIL/uL (ref 4.22–5.81)
RDW: 15.6 % — AB (ref 11.5–15.5)
WBC: 5 10*3/uL (ref 4.0–10.5)

## 2017-09-29 LAB — COMPREHENSIVE METABOLIC PANEL
ALK PHOS: 103 U/L (ref 38–126)
ALT: 42 U/L (ref 0–44)
AST: 44 U/L — AB (ref 15–41)
Albumin: 3.6 g/dL (ref 3.5–5.0)
Anion gap: 12 (ref 5–15)
BILIRUBIN TOTAL: 1 mg/dL (ref 0.3–1.2)
BUN: 10 mg/dL (ref 6–20)
CALCIUM: 9.2 mg/dL (ref 8.9–10.3)
CO2: 22 mmol/L (ref 22–32)
Chloride: 100 mmol/L (ref 98–111)
Creatinine, Ser: 1.07 mg/dL (ref 0.61–1.24)
GFR calc Af Amer: >60 mL/min (ref >60)
Glucose, Bld: 350 mg/dL — ABNORMAL HIGH (ref 70–99)
POTASSIUM: 5 mmol/L (ref 3.5–5.1)
Sodium: 134 mmol/L — ABNORMAL LOW (ref 135–145)
TOTAL PROTEIN: 6.9 g/dL (ref 6.5–8.1)

## 2017-09-29 LAB — RAPID URINE DRUG SCREEN, HOSP PERFORMED
Amphetamines: NOT DETECTED
BARBITURATES: NOT DETECTED
BENZODIAZEPINES: NOT DETECTED
Cocaine: POSITIVE — AB
Opiates: NOT DETECTED
Tetrahydrocannabinol: POSITIVE — AB

## 2017-09-29 LAB — ACETAMINOPHEN LEVEL: Acetaminophen (Tylenol), Serum: <10 ug/mL — ABNORMAL LOW (ref 10–30)

## 2017-09-29 LAB — SALICYLATE LEVEL: Salicylate Lvl: <7 mg/dL (ref 2.8–30.0)

## 2017-09-29 LAB — I-STAT TROPONIN, ED
TROPONIN I, POC: 0.04 ng/mL (ref 0.00–0.08)
Troponin i, poc: 0 ng/mL (ref 0.00–0.08)

## 2017-09-29 LAB — CBG MONITORING, ED
Glucose-Capillary: 368 mg/dL — ABNORMAL HIGH (ref 70–99)
Glucose-Capillary: 309 mg/dL — ABNORMAL HIGH (ref 70–99)

## 2017-09-29 LAB — ETHANOL

## 2017-09-29 MED ORDER — FUROSEMIDE 20 MG PO TABS
40.0000 mg | ORAL_TABLET | Freq: Every day | ORAL | Status: DC
  Administered 2017-09-29: 40 mg via ORAL
  Filled 2017-09-29: qty 2

## 2017-09-29 MED ORDER — INSULIN ASPART 100 UNIT/ML ~~LOC~~ SOLN
10.0000 [IU] | Freq: Once | SUBCUTANEOUS | Status: AC
  Administered 2017-09-29: 10 [IU] via INTRAVENOUS
  Filled 2017-09-29: qty 1

## 2017-09-29 MED ORDER — ATORVASTATIN CALCIUM 80 MG PO TABS
80.0000 mg | ORAL_TABLET | Freq: Every day | ORAL | Status: DC
  Administered 2017-09-29: 80 mg via ORAL
  Filled 2017-09-29 (×2): qty 1

## 2017-09-29 MED ORDER — METOPROLOL TARTRATE 25 MG PO TABS
50.0000 mg | ORAL_TABLET | Freq: Two times a day (BID) | ORAL | Status: DC
  Administered 2017-09-29 (×2): 50 mg via ORAL
  Filled 2017-09-29 (×2): qty 2

## 2017-09-29 MED ORDER — POTASSIUM CHLORIDE 20 MEQ/15ML (10%) PO SOLN
20.0000 meq | Freq: Every day | ORAL | Status: DC
  Administered 2017-09-29: 20 meq via ORAL
  Filled 2017-09-29: qty 15

## 2017-09-29 MED ORDER — METFORMIN HCL 500 MG PO TABS
1000.0000 mg | ORAL_TABLET | Freq: Two times a day (BID) | ORAL | Status: DC
  Administered 2017-09-29: 1000 mg via ORAL
  Filled 2017-09-29 (×3): qty 2

## 2017-09-29 MED ORDER — MELATONIN 3 MG PO TABS
6.0000 mg | ORAL_TABLET | Freq: Every evening | ORAL | Status: DC | PRN
  Filled 2017-09-29: qty 2

## 2017-09-29 MED ORDER — TRAZODONE HCL 100 MG PO TABS
100.0000 mg | ORAL_TABLET | Freq: Every day | ORAL | Status: DC
  Administered 2017-09-29: 100 mg via ORAL
  Filled 2017-09-29: qty 1

## 2017-09-29 MED ORDER — ASPIRIN EC 325 MG PO TBEC
325.0000 mg | DELAYED_RELEASE_TABLET | Freq: Every day | ORAL | Status: DC
  Administered 2017-09-29: 325 mg via ORAL
  Filled 2017-09-29: qty 1

## 2017-09-29 MED ORDER — GLIPIZIDE 5 MG PO TABS
5.0000 mg | ORAL_TABLET | Freq: Every day | ORAL | Status: DC
  Filled 2017-09-29: qty 1

## 2017-09-29 MED ORDER — INSULIN GLARGINE 100 UNIT/ML ~~LOC~~ SOLN
30.0000 [IU] | Freq: Every day | SUBCUTANEOUS | Status: DC
  Administered 2017-09-29: 30 [IU] via SUBCUTANEOUS
  Filled 2017-09-29 (×2): qty 0.3

## 2017-09-29 NOTE — ED Notes (Signed)
Per Maralyn Sago, with [facility], pt has been accepted to [facility]. May monitor CBG overnight and transfer in the morning

## 2017-09-29 NOTE — ED Notes (Signed)
Ordered heart healthy meal tray. 

## 2017-09-29 NOTE — ED Notes (Signed)
Pt wanded, belongings placed in security and locker 6.

## 2017-09-29 NOTE — Progress Notes (Signed)
Pt accepted to [facility]; room 302-2 Malachy Chamber, NP is the accepting provider.  Dr. Jama Flavors is the attending provider.  Call report to [phone]   [facility] ED notified.    Pt is voluntary and can be transported by [facility]. Pt's CBG levels will need to stabilize prior to being transported. [facility] AC will be monitoring levels and will notify appropriate [facility] ED staff when pt can be transported.   Wells Guiles, LCSW, LCAS Disposition CSW [facility]/TTS [phone]
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
## 2017-09-29 NOTE — ED Triage Notes (Signed)
Pt arrives via EMS from home.[facility]  CP started last night. Nonradiating. 5/10 pain. EMS gave 324 mg ASA, 1 nitro. Pain decreased to 2/10 after nitro. Pt told EMS on the way to ED that he is feeling suicidal. Pt took a handfull of pills two days ago- unsure of how many and unsure what the pills were.

## 2017-09-29 NOTE — ED Provider Notes (Signed)
[facility] EMERGENCY DEPARTMENT Provider Note   CSN: [id number] Arrival date & time: 09/29/17  1020     History   Chief Complaint Chief Complaint  Patient presents with  . Chest Pain  . Suicidal    HPI Marcus Cummings Sr. is a 52 y.o. male.  52 year old male with prior medical history as detailed below presents with complaint of suicidal ideation.  Patient reports increasing thoughts of depression with thoughts of self-harm over the last several days.  He reports that he took a "handful of sleeping pills" 3 days ago in order to try to kill himself.  He denies any recent ingestion or overdose attempt.  He reports a long-standing history of prior suicidal attempts.  Patient also reports vague chest discomfort that started yesterday.  This discomfort has been constant for more than 12 hours.  He denies associated shortness of breath, nausea, vomiting, diaphoresis or other complaint.  The history is provided by the patient and medical records.  Chest Pain   This is a new problem. The current episode started yesterday. The problem occurs rarely. The problem has been resolved. The pain is associated with an emotional upset. The pain is present in the substernal region. The patient is experiencing no pain. The quality of the pain is described as pressure-like. The pain does not radiate. Duration of episode(s) is 16 hours.    Past Medical History:  Diagnosis Date  . Diabetes mellitus without complication (HCC)   . Hypertension   . MI, acute, non ST segment elevation (HCC) 2007   stents x 2 2007 [facility] [place])  . Stroke [facility]) 2017    Patient Active Problem List   Diagnosis Date Noted  . 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 Essential hypertension   . Dyslipidemia   . Chest pain on exertion   . Stable angina (HCC) 06/17/2017    Past Surgical History:  Procedure Laterality Date  . CORONARY ARTERY BYPASS GRAFT N/A 06/23/2017   Procedure: CORONARY ARTERY BYPASS GRAFTING  (CABG) x 3; Using Left Internal Mammary Artery and Right Leg Greater Saphenous Vein harvested endoscopically.;  Surgeon: Kerin Perna, MD;  Location: [facility] OR;  Service: Open Heart Surgery;  Laterality: N/A;  . LEFT HEART CATH AND CORONARY ANGIOGRAPHY N/A 06/18/2017   Procedure: LEFT HEART CATH AND CORONARY ANGIOGRAPHY;  Surgeon: Lennette Bihari, MD;  Location: [facility] INVASIVE CV LAB;  Service: Cardiovascular;  Laterality: N/A;  . TEE WITHOUT CARDIOVERSION N/A 06/23/2017   Procedure: TRANSESOPHAGEAL ECHOCARDIOGRAM (TEE);  Surgeon: Donata Clay, Theron Arista, MD;  Location: [facility] OR;  Service: Open Heart Surgery;  Laterality: N/A;        Home Medications    Prior to Admission medications   Medication Sig Start Date End Date Taking? Authorizing Provider  aspirin EC 325 MG EC tablet Take 1 tablet (325 mg total) by mouth daily. 07/02/17  Yes Gold, Wayne E, PA-C  atorvastatin (LIPITOR) 80 MG tablet Take 1 tablet (80 mg total) by mouth daily at 6 PM. 07/01/17  Yes Gold, Wayne E, PA-C  furosemide (LASIX) 40 MG tablet Take 1 tablet (40 mg total) by mouth daily. 07/02/17  Yes Gold, Wayne E, PA-C  glipiZIDE (GLUCOTROL) 5 MG tablet Take 5 mg by mouth daily before breakfast.   Yes [provider]  insulin glargine (LANTUS) 100 UNIT/ML injection Inject 30 Units into the skin at bedtime.   Yes [provider]  Melatonin 3 MG CAPS Take 6 mg by mouth at bedtime  as needed (for sleep).   Yes [provider]  metFORMIN (GLUCOPHAGE) 1000 MG tablet Take 1,000 mg by mouth 2 (two) times daily with a meal.   Yes [provider]  metoprolol tartrate (LOPRESSOR) 50 MG tablet Take 1 tablet (50 mg total) by mouth 2 (two) times daily. 07/01/17  Yes Gold, Glenice Laine, PA-C  Naltrexone 380 MG SUSR Inject 380 mg into the muscle every 28 (twenty-eight) days.   Yes [provider]  paliperidone (INVEGA SUSTENNA) 156 MG/ML SUSY injection Inject 156 mg into the muscle every 28 (twenty-eight) days.   Yes [provider]  potassium chloride 20 MEQ TBCR Take 20 mEq by mouth daily. 07/01/17  Yes Gold, Wayne E, PA-C  traZODone (DESYREL) 100 MG tablet Take 100 mg by mouth at bedtime.   Yes [provider]    Family History Family History  Problem Relation Age of Onset  . CAD Mother   . Congestive Heart Failure Father     Social History Social History   Tobacco Use  . Smoking status: Never Smoker  . Smokeless tobacco: Never Used  Substance Use Topics  . Alcohol use: Yes    Comment: 1-2  beers a week  . Drug use: Yes    Types: Marijuana     Allergies   Patient has no known allergies.   Review of Systems Review of Systems  Cardiovascular: Positive for chest pain.  All other systems reviewed and are negative.    Physical Exam Updated Vital Signs BP 140/87   Pulse 86   Temp 98.6 F (37 C) (Oral)   Resp 16   Ht 6' (1.829 m)   Wt 86.2 kg   SpO2 98%   BMI 25.77 kg/m   Physical Exam  Constitutional: He is oriented to person, place, and time. He appears well-developed and well-nourished. No distress.  HENT:  Head: Normocephalic and atraumatic.  Mouth/Throat: Oropharynx is clear and moist.  Eyes: Pupils are equal, round, and reactive to light. Conjunctivae and EOM are normal.  Neck: Normal range of motion. Neck supple.  Cardiovascular: Normal rate, regular rhythm and normal heart sounds.  Pulmonary/Chest: Effort normal and breath sounds normal. No respiratory distress.  Abdominal: Soft. He exhibits no distension. There is no tenderness.  Musculoskeletal: Normal range of motion. He exhibits no edema or deformity.  Neurological: He is alert and oriented to person, place, and time.  Skin: Skin is warm and dry.  Psychiatric: He has a normal mood and affect.  Expresses SI with plan to overdose   Nursing note and vitals reviewed.    ED Treatments / Results  Labs (all labs ordered are listed, but only abnormal results are displayed) Labs Reviewed  COMPREHENSIVE  METABOLIC PANEL - Abnormal; Notable for the following components:      Result Value   Sodium 134 (*)    Glucose, Bld 350 (*)    AST 44 (*)    All other components within normal limits  ACETAMINOPHEN LEVEL - Abnormal; Notable for the following components:   Acetaminophen (Tylenol), Serum <10 (*)    All other components within normal limits  CBC - Abnormal; Notable for the following components:   RDW 15.6 (*)    All other components within normal limits  RAPID URINE DRUG SCREEN, HOSP PERFORMED - Abnormal; Notable for the following components:   Cocaine POSITIVE (*)    Tetrahydrocannabinol POSITIVE (*)    All other components within normal limits  ETHANOL  SALICYLATE LEVEL  I-STAT TROPONIN, ED  I-STAT TROPONIN, ED    EKG EKG Interpretation  Date/Time:  Tuesday September 29 2017 10:32:47 EDT Ventricular Rate:  82 PR Interval:    QRS Duration: 91 QT Interval:  365 QTC Calculation: 427 R Axis:   -19 Text Interpretation:  Sinus rhythm Inferior infarct, old Confirmed by Kristine Royal [phone]) on 09/29/2017 10:39:01 AM Also confirmed by Kristine Royal [phone]), editor Barbette Hair [phone])  on 09/29/2017 11:54:23 AM   Radiology Dg Chest 2 View  Result Date: 09/29/2017 CLINICAL DATA:  Chest pain. EXAM: CHEST - 2 VIEW COMPARISON:  06/27/2017. FINDINGS: Prior CABG. Heart size normal. Normal pulmonary vascularity. No focal infiltrate. Interval clearing of bibasilar atelectasis and pleural effusions. No pneumothorax. Degenerative change thoracic spine. IMPRESSION: 1.  Prior CABG.  Heart size normal. 2. No acute pulmonary disease. Interval clearing of bibasilar atelectasis and pleural effusion. Electronically Signed   By: Marcus Cummings  Register   On: 09/29/2017 11:01    Procedures Procedures (including critical care time)  Medications Ordered in ED Medications - No data to display   Initial Impression / Assessment and Plan / ED Course  I have reviewed the triage vital signs and the nursing  notes.  Pertinent labs & imaging results that were available during my care of the patient were reviewed by me and considered in my medical decision making (see chart for details).     MDM  Screen complete  Patient is presenting for evaluation of his expressed suicidal ideation.  Patient will require further evaluation by psychiatry.  Patient is reported episode of chest pain is highly atypical.  There is no evidence of ischemia upon the ED work-up today.  EKG is without evidence of ischemia.  Troponin x2 is negative.  Patient is medically clear at this time for further evaluation by psychiatry.  Final disposition is dependent upon psychiatry's assessment and plan.  Final Clinical Impressions(s) / ED Diagnoses   Final diagnoses:  Suicidal ideation  Chest pain, unspecified type    ED Discharge Orders    None       Wynetta Fines, MD 09/29/17 1454

## 2017-09-29 NOTE — Progress Notes (Signed)
Per Malachy Chamber, NP, pt meets inpatient criteria. CSW spoke with AOD [place] @ [address] and assessed that the facility is currently at capacity. The AOD stated that because of this, there should be no issues with referring him elsewhere. Referral information has been sent to the following hospitals for review: [facility]  [facility]  [facility]  [facility]  [facility]  [facility]  [facility]  [facility]  [facility]  [facility]   CSW will continue to assist with placement needs.   Wells Guiles, LCSW, LCAS Disposition CSW [facility] [phone]

## 2017-09-30 ENCOUNTER — Encounter (HOSPITAL_COMMUNITY): Payer: Self-pay | Admitting: *Deleted

## 2017-09-30 ENCOUNTER — Inpatient Hospital Stay (HOSPITAL_COMMUNITY)
Admission: AD | Admit: 2017-09-30 | Discharge: 2017-10-14 | DRG: 885 | Disposition: A | Discharge status: Home / Self Care | Payer: Federal, State, Local not specified - Other | Source: Intra-hospital | Attending: Psychiatry | Admitting: Psychiatry

## 2017-09-30 ENCOUNTER — Other Ambulatory Visit: Payer: Self-pay

## 2017-09-30 ENCOUNTER — Other Ambulatory Visit: Payer: Self-pay | Admitting: Registered Nurse

## 2017-09-30 DIAGNOSIS — F1290 Cannabis use, unspecified, uncomplicated: Secondary | ICD-10-CM | POA: Diagnosis present

## 2017-09-30 DIAGNOSIS — F1210 Cannabis abuse, uncomplicated: Secondary | ICD-10-CM | POA: Diagnosis not present

## 2017-09-30 DIAGNOSIS — Z8673 Personal history of transient ischemic attack (TIA), and cerebral infarction without residual deficits: Secondary | ICD-10-CM | POA: Diagnosis not present

## 2017-09-30 DIAGNOSIS — Z79890 Hormone replacement therapy (postmenopausal): Secondary | ICD-10-CM | POA: Diagnosis not present

## 2017-09-30 DIAGNOSIS — I2510 Atherosclerotic heart disease of native coronary artery without angina pectoris: Secondary | ICD-10-CM | POA: Diagnosis present

## 2017-09-30 DIAGNOSIS — F419 Anxiety disorder, unspecified: Secondary | ICD-10-CM | POA: Diagnosis not present

## 2017-09-30 DIAGNOSIS — F1020 Alcohol dependence, uncomplicated: Secondary | ICD-10-CM | POA: Diagnosis present

## 2017-09-30 DIAGNOSIS — R45851 Suicidal ideations: Secondary | ICD-10-CM | POA: Diagnosis present

## 2017-09-30 DIAGNOSIS — F4310 Post-traumatic stress disorder, unspecified: Secondary | ICD-10-CM | POA: Diagnosis present

## 2017-09-30 DIAGNOSIS — F250 Schizoaffective disorder, bipolar type: Principal | ICD-10-CM | POA: Diagnosis present

## 2017-09-30 DIAGNOSIS — F332 Major depressive disorder, recurrent severe without psychotic features: Secondary | ICD-10-CM | POA: Diagnosis present

## 2017-09-30 DIAGNOSIS — E785 Hyperlipidemia, unspecified: Secondary | ICD-10-CM | POA: Diagnosis present

## 2017-09-30 DIAGNOSIS — F259 Schizoaffective disorder, unspecified: Secondary | ICD-10-CM | POA: Diagnosis present

## 2017-09-30 DIAGNOSIS — Z79899 Other long term (current) drug therapy: Secondary | ICD-10-CM

## 2017-09-30 DIAGNOSIS — Z7982 Long term (current) use of aspirin: Secondary | ICD-10-CM | POA: Diagnosis not present

## 2017-09-30 DIAGNOSIS — E119 Type 2 diabetes mellitus without complications: Secondary | ICD-10-CM | POA: Diagnosis present

## 2017-09-30 DIAGNOSIS — Z8249 Family history of ischemic heart disease and other diseases of the circulatory system: Secondary | ICD-10-CM | POA: Diagnosis not present

## 2017-09-30 DIAGNOSIS — F1410 Cocaine abuse, uncomplicated: Secondary | ICD-10-CM | POA: Diagnosis present

## 2017-09-30 DIAGNOSIS — F17210 Nicotine dependence, cigarettes, uncomplicated: Secondary | ICD-10-CM | POA: Diagnosis present

## 2017-09-30 DIAGNOSIS — I252 Old myocardial infarction: Secondary | ICD-10-CM

## 2017-09-30 DIAGNOSIS — Z915 Personal history of self-harm: Secondary | ICD-10-CM | POA: Diagnosis not present

## 2017-09-30 DIAGNOSIS — Z794 Long term (current) use of insulin: Secondary | ICD-10-CM

## 2017-09-30 DIAGNOSIS — G4700 Insomnia, unspecified: Secondary | ICD-10-CM | POA: Diagnosis present

## 2017-09-30 DIAGNOSIS — Z951 Presence of aortocoronary bypass graft: Secondary | ICD-10-CM

## 2017-09-30 DIAGNOSIS — Z23 Encounter for immunization: Secondary | ICD-10-CM

## 2017-09-30 DIAGNOSIS — I10 Essential (primary) hypertension: Secondary | ICD-10-CM | POA: Diagnosis present

## 2017-09-30 LAB — GLUCOSE, CAPILLARY
GLUCOSE-CAPILLARY: 429 mg/dL — AB (ref 70–99)
Glucose-Capillary: 343 mg/dL — ABNORMAL HIGH (ref 70–99)
Glucose-Capillary: 383 mg/dL — ABNORMAL HIGH (ref 70–99)
Glucose-Capillary: 297 mg/dL — ABNORMAL HIGH (ref 70–99)

## 2017-09-30 LAB — CBG MONITORING, ED: GLUCOSE-CAPILLARY: 236 mg/dL — AB (ref 70–99)

## 2017-09-30 MED ORDER — MELATONIN 3 MG PO TABS
6.0000 mg | ORAL_TABLET | Freq: Every evening | ORAL | Status: DC | PRN
  Filled 2017-09-30: qty 2

## 2017-09-30 MED ORDER — ASPIRIN EC 325 MG PO TBEC
325.0000 mg | DELAYED_RELEASE_TABLET | Freq: Every day | ORAL | Status: DC
  Administered 2017-09-30 – 2017-10-13 (×14): 325 mg via ORAL
  Filled 2017-09-30 (×3): qty 1
  Filled 2017-09-30: qty 14
  Filled 2017-09-30 (×13): qty 1
  Filled 2017-09-30: qty 14

## 2017-09-30 MED ORDER — INSULIN ASPART 100 UNIT/ML ~~LOC~~ SOLN
0.0000 [IU] | Freq: Three times a day (TID) | SUBCUTANEOUS | Status: DC
  Administered 2017-09-30: 9 [IU] via SUBCUTANEOUS

## 2017-09-30 MED ORDER — INSULIN GLARGINE 100 UNIT/ML ~~LOC~~ SOLN: 30.0000 [IU] | Freq: Every day | SUBCUTANEOUS | Status: DC

## 2017-09-30 MED ORDER — INFLUENZA VAC SPLIT QUAD 0.5 ML IM SUSY
0.5000 mL | PREFILLED_SYRINGE | INTRAMUSCULAR | Status: AC
  Administered 2017-10-01: 0.5 mL via INTRAMUSCULAR
  Filled 2017-09-30: qty 0.5

## 2017-09-30 MED ORDER — ALUM & MAG HYDROXIDE-SIMETH 200-200-20 MG/5ML PO SUSP: 30.0000 mL | ORAL | Status: DC | PRN

## 2017-09-30 MED ORDER — FUROSEMIDE 40 MG PO TABS
40.0000 mg | ORAL_TABLET | Freq: Every day | ORAL | Status: DC
  Administered 2017-09-30 – 2017-10-13 (×14): 40 mg via ORAL
  Filled 2017-09-30 (×4): qty 1
  Filled 2017-09-30: qty 14
  Filled 2017-09-30 (×4): qty 1
  Filled 2017-09-30: qty 14
  Filled 2017-09-30 (×7): qty 1

## 2017-09-30 MED ORDER — LISINOPRIL 5 MG PO TABS
5.0000 mg | ORAL_TABLET | Freq: Every day | ORAL | Status: DC
  Administered 2017-09-30 – 2017-10-13 (×14): 5 mg via ORAL
  Filled 2017-09-30 (×12): qty 1
  Filled 2017-09-30: qty 14
  Filled 2017-09-30 (×5): qty 1
  Filled 2017-09-30: qty 14

## 2017-09-30 MED ORDER — INSULIN ASPART 100 UNIT/ML ~~LOC~~ SOLN
0.0000 [IU] | Freq: Every day | SUBCUTANEOUS | Status: DC
  Administered 2017-09-30: 3 [IU] via SUBCUTANEOUS

## 2017-09-30 MED ORDER — GLIPIZIDE 5 MG PO TABS
5.0000 mg | ORAL_TABLET | Freq: Every day | ORAL | Status: DC
  Filled 2017-09-30 (×2): qty 1

## 2017-09-30 MED ORDER — METOPROLOL TARTRATE 50 MG PO TABS
50.0000 mg | ORAL_TABLET | Freq: Two times a day (BID) | ORAL | Status: DC
  Administered 2017-09-30 – 2017-10-13 (×28): 50 mg via ORAL
  Filled 2017-09-30 (×3): qty 1
  Filled 2017-09-30: qty 2
  Filled 2017-09-30: qty 1
  Filled 2017-09-30: qty 2
  Filled 2017-09-30: qty 28
  Filled 2017-09-30 (×4): qty 1
  Filled 2017-09-30: qty 28
  Filled 2017-09-30: qty 1
  Filled 2017-09-30: qty 2
  Filled 2017-09-30 (×5): qty 1
  Filled 2017-09-30: qty 28
  Filled 2017-09-30 (×4): qty 1
  Filled 2017-09-30: qty 28
  Filled 2017-09-30 (×11): qty 1

## 2017-09-30 MED ORDER — INSULIN ASPART 100 UNIT/ML ~~LOC~~ SOLN
3.0000 [IU] | Freq: Three times a day (TID) | SUBCUTANEOUS | Status: DC
  Administered 2017-09-30 – 2017-10-13 (×31): 3 [IU] via SUBCUTANEOUS

## 2017-09-30 MED ORDER — MAGNESIUM HYDROXIDE 400 MG/5ML PO SUSP: 30.0000 mL | Freq: Every day | ORAL | Status: DC | PRN

## 2017-09-30 MED ORDER — INSULIN ASPART 100 UNIT/ML ~~LOC~~ SOLN
0.0000 [IU] | Freq: Three times a day (TID) | SUBCUTANEOUS | Status: DC
  Administered 2017-09-30: 15 [IU] via SUBCUTANEOUS
  Administered 2017-10-01: 2 [IU] via SUBCUTANEOUS
  Administered 2017-10-01 (×2): 5 [IU] via SUBCUTANEOUS
  Administered 2017-10-02: 15 [IU] via SUBCUTANEOUS
  Administered 2017-10-02 – 2017-10-03 (×2): 8 [IU] via SUBCUTANEOUS
  Administered 2017-10-04 (×2): 2 [IU] via SUBCUTANEOUS
  Administered 2017-10-05: 5 [IU] via SUBCUTANEOUS
  Administered 2017-10-06: 8 [IU] via SUBCUTANEOUS
  Administered 2017-10-07 (×2): 2 [IU] via SUBCUTANEOUS
  Administered 2017-10-08: 8 [IU] via SUBCUTANEOUS
  Administered 2017-10-09 (×2): 2 [IU] via SUBCUTANEOUS
  Administered 2017-10-10 – 2017-10-11 (×3): 3 [IU] via SUBCUTANEOUS
  Administered 2017-10-12: 5 [IU] via SUBCUTANEOUS
  Administered 2017-10-12: 2 [IU] via SUBCUTANEOUS
  Administered 2017-10-13: 5 [IU] via SUBCUTANEOUS

## 2017-09-30 MED ORDER — METFORMIN HCL 500 MG PO TABS
1000.0000 mg | ORAL_TABLET | Freq: Two times a day (BID) | ORAL | Status: DC
  Administered 2017-09-30 – 2017-10-13 (×27): 1000 mg via ORAL
  Filled 2017-09-30 (×16): qty 2
  Filled 2017-09-30: qty 56
  Filled 2017-09-30 (×3): qty 2
  Filled 2017-09-30: qty 56
  Filled 2017-09-30 (×4): qty 2
  Filled 2017-09-30: qty 56
  Filled 2017-09-30 (×2): qty 2
  Filled 2017-09-30: qty 56
  Filled 2017-09-30 (×3): qty 2

## 2017-09-30 MED ORDER — POTASSIUM CHLORIDE 20 MEQ/15ML (10%) PO SOLN
20.0000 meq | Freq: Every day | ORAL | Status: DC
  Administered 2017-09-30 – 2017-10-02 (×3): 20 meq via ORAL
  Filled 2017-09-30 (×5): qty 15

## 2017-09-30 MED ORDER — SERTRALINE HCL 25 MG PO TABS
25.0000 mg | ORAL_TABLET | Freq: Every day | ORAL | Status: DC
  Administered 2017-09-30 – 2017-10-01 (×2): 25 mg via ORAL
  Filled 2017-09-30 (×4): qty 1

## 2017-09-30 MED ORDER — INSULIN GLARGINE 100 UNIT/ML ~~LOC~~ SOLN
30.0000 [IU] | Freq: Every day | SUBCUTANEOUS | Status: DC
  Administered 2017-09-30 – 2017-10-13 (×14): 30 [IU] via SUBCUTANEOUS
  Filled 2017-09-30: qty 0.3

## 2017-09-30 MED ORDER — ATORVASTATIN CALCIUM 40 MG PO TABS
80.0000 mg | ORAL_TABLET | Freq: Every day | ORAL | Status: DC
  Administered 2017-09-30 – 2017-10-13 (×15): 80 mg via ORAL
  Filled 2017-09-30 (×3): qty 1
  Filled 2017-09-30: qty 28
  Filled 2017-09-30 (×3): qty 1
  Filled 2017-09-30: qty 28
  Filled 2017-09-30 (×4): qty 1
  Filled 2017-09-30: qty 2
  Filled 2017-09-30 (×4): qty 1

## 2017-09-30 MED ORDER — ACETAMINOPHEN 325 MG PO TABS
650.0000 mg | ORAL_TABLET | Freq: Four times a day (QID) | ORAL | Status: DC | PRN
  Administered 2017-10-02 – 2017-10-13 (×8): 650 mg via ORAL
  Filled 2017-09-30 (×8): qty 2

## 2017-09-30 MED ORDER — TRAZODONE HCL 100 MG PO TABS
100.0000 mg | ORAL_TABLET | Freq: Every day | ORAL | Status: DC
  Administered 2017-09-30 – 2017-10-04 (×5): 100 mg via ORAL
  Filled 2017-09-30 (×6): qty 1

## 2017-09-30 NOTE — Progress Notes (Signed)
Recreation Therapy Notes  Date: 9.4.19 Time: 0930 Location: [address] Dayroom  Group Topic: Stress Management  Goal Area(s) Addresses:  Patient will verbalize importance of using healthy stress management.  Patient will identify positive emotions associated with healthy stress management.   Behavioral Response: Engaged  Intervention: Stress Management  Activity :  Guided Imagery.  LRT introduced the stress management technique of guided imagery.  LRT read a script that allowed patients to enjoy the summer clouds.  Patients were to follow along as LRT read script to engage in activity.  Education:  Stress Management, Discharge Planning.   Education Outcome: Acknowledges edcuation/In group clarification offered/Needs additional education  Clinical Observations/Feedback: Pt attended and participated in group.     Caroll Rancher, LRT/CTRS     Lillia Abed, Haivyn Oravec A 09/30/2017 10:47 AM

## 2017-09-30 NOTE — [facility] Group Notes (Signed)
[facility] Group Notes:  (Nursing/MHT/Case Management/Adjunct)  Date:  09/30/2017  Time:  4:00 pm  Type of Therapy:  Psychoeducational Skills  Participation Level:  Active  Participation Quality:  Appropriate  Affect:  Appropriate  Cognitive:  Appropriate  Insight:  Appropriate  Engagement in Group:  Engaged  Modes of Intervention:  Education  Summary of Progress/Problems: Patient was alert and participated appropriately in group.     Marcus Cummings 09/30/2017, 5:59 PM

## 2017-09-30 NOTE — Therapy (Signed)
Occupational Therapy Group Note  Date:  09/30/2017 Time:  12:50 PM  Group Topic/Focus:  Stress Management  Participation Level:  Minimal  Participation Quality:  Inattentive  Affect:  Flat  Cognitive:  Appropriate  Insight: Lacking  Engagement in Group:  Limited and None  Modes of Intervention:  Activity, Discussion, Education and Socialization  Additional Comments:    S: *pt did not give comment throughout entirety of group  O: Stress management group completed to use as productive coping strategy, to help mitigate maladaptive coping to integrate in functional BADL/IADL when reintegrating into community. Education given on the definition of stress and its cognitive, behavioral, emotional, and physical effects on the body. Stress management tools worksheet completed to identify negative coping mechanisms and their short and long term effects vs positive coping mechanisms with demonstration. Coping strategies taught include: relaxation based- deep breathing, counting to 10, taking a 1 minute vacation, acceptance, stress balls, relaxation audio/video, visual/mental imagery. Positive mental attitude- gratitude, acceptance, cognitive reframing, positive self talk, anger management. Pt asked to identify one coping skill that they would like to implement this date.  A: Pt presents to group with flat affect, drowst, minimally engaged. Pt recived stress management tools worksheet and education on negative vs. Positive coping skills. Pt did not offer any information or insight into current pracitces or areas of change this date.  P: Pt provided with education on stress management activities to implement into daily routine. Handouts given to facilitate carryover when reintegrating into community    [facility] [place], OTR/L  [employer] 09/30/2017, 12:50 PM

## 2017-09-30 NOTE — Progress Notes (Signed)
   Called by Dr Jama Flavors from [facility] with questions about management of diabetes and hypertension this patient  Patient apparently quit taking his medications including insulin a couple days prior to presenting to the ED  Being admitted to behavioral health unit for depression and suicidal concerns at this time   1)DM2--- A1c in May 2019 was 7.4, repeat A1c pending, labs this time reveal anion gap of 12 bicarb of 22 elevated blood sugar over 300--- patient with hypoglycemia in the setting of quitting his diabetic medications including insulin, no evidence of DKA..... Restart Lantus insulin to 30 units daily, Use Novolog/Humalog Sliding scale insulin with Accu-Cheks/Fingersticks as ordered, okay to stop glipizide, may use metformin 1000 mg twice daily.   2)HTN--stage II, patient apparently quit taking his BP meds at least 2 days prior to admission, restart metoprolol 50 mg twice daily, give lisinopril 5 mg daily  3)H/o CAD/S/p Prior CABG--serial troponins were negative in the ED within the last 24 hours, restart aspirin , Lipitor ,metoprolol as ordered   Hospitalist service will sign off at this time please call us back if any further questions Shon Hale, MD

## 2017-09-30 NOTE — ED Notes (Signed)
Pt ambulatory out of department with [facility] driver. Belongings given to [facility] driver. Pt in NAD.

## 2017-09-30 NOTE — Plan of Care (Signed)
Pt is a new admit on 300. Problem: Education: Goal: Knowledge of [facility] Education information/materials will improve Outcome: Not Progressing Goal: Emotional status will improve Outcome: Not Progressing Goal: Mental status will improve Outcome: Not Progressing Goal: Verbalization of understanding the information provided will improve Outcome: Not Progressing   Problem: Health Behavior/Discharge Planning: Goal: Identification of resources available to assist in meeting health care needs will improve Outcome: Not Progressing Goal: Compliance with treatment plan for underlying cause of condition will improve Outcome: Not Progressing   Problem: Safety: Goal: Periods of time without injury will increase Outcome: Not Progressing   Problem: Education: Goal: Utilization of techniques to improve thought processes will improve Outcome: Not Progressing Goal: Knowledge of the prescribed therapeutic regimen will improve Outcome: Not Progressing   Problem: Self-Concept: Goal: Will verbalize positive feelings about self Outcome: Not Progressing Goal: Level of anxiety will decrease Outcome: Not Progressing   Problem: Medication: Goal: Compliance with prescribed medication regimen will improve Outcome: Not Progressing   Problem: Coping: Goal: Ability to identify and develop effective coping behavior will improve Outcome: Not Progressing Goal: Ability to interact with others will improve Outcome: Not Progressing Goal: Demonstration of participation in decision-making regarding own care will improve Outcome: Not Progressing Goal: Ability to use eye contact when communicating with others will improve Outcome: Not Progressing

## 2017-09-30 NOTE — H&P (Addendum)
Psychiatric Admission Assessment Adult  Patient Identification: Marcus MAGGIO Sr. MRN:  [id number] Date of Evaluation:  09/30/2017 Chief Complaint:  MDD REC SEV COCAINE USE DISORDER CANNABIS USE DISORDER Principal Diagnosis: Severe recurrent major depression without psychotic features ([place]) Diagnosis:   Patient Active Problem List   Diagnosis Date Noted  . Severe recurrent major depression without psychotic features ([place]) [F33.2] 09/30/2017  . 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 Essential hypertension [I10]   . Dyslipidemia [E78.5]   . Chest pain on exertion [R07.9]   . Stable angina (HCC) [I20.8] 06/17/2017   History of Present Illness: 47, divorced, lives alone, has two adult children, on [place] disability ( served in [place]) who presents to the unit with a psychiatric history of Bipolar Disorder, alcohol use disorder, PTSD and depression. Patient initially presented tot he ED with complaints of chest pain. While there, he endorsed that he was having suicidal thoughts. He was medically cleared and transferred tot he [facility] unit. Patient acknowledges his SI prior to admission and at current. He reports a history of chronic depression and reports a couple of days prior to his admission, he overdosed on Melatonin. He states he took a " handful" and did not seek treatment at that time. He describes depressive symptoms anhedonia, decreased energy, decreased sleep, poor appetite. Denies psychotic symptoms although does endorse he has head voices int he past. Reports main stressor as chronic medical conditions (CVA in 2017, had bypass surgery in May 2019).  He reports he has had multiple psychiatric admissions most recently 2-3 months ago at [facility], for depression. Reports multiple suicidal attempts by overdosing.He reports he has tried several psychotropic medication but at current, he is on [place] 156 mgrs monthly and on Naltrexone 380 mgrs SUSR monthly. He last got medications " about mid  August ). In regards to his history of alcohol use disorder, he reports he last drank 4-5 days ago. Reports his drinking has significantly decreased, and recently states he has been drinking 3-4 x per week, one beer per episode . Endorses using cannabis once x week, denies other drug abuse. He reports a history of violent behaviors although none recently.He denies homicidal thoughts at this time. He reports his goal is to be discharged to a [place] facility for long term treatment of his mental health issues. He is contracting for safety at this time    Associated Signs/Symptoms: Depression Symptoms:  depressed mood, anhedonia, insomnia, suicidal thoughts without plan, anxiety, loss of energy/fatigue, decreased appetite, (Hypo) Manic Symptoms:  none Anxiety Symptoms:  Excessive Worry, Psychotic Symptoms:  none PTSD Symptoms: NA Total Time spent with patient: 45 minutes  Past Psychiatric History: History of prior psychiatric admissions , most recently 2-3 months ago at [facility] hospital, for depression.  Reports history of prior suicidal attempts by overdosing . Reports past , not recent, history of auditory hallucinations. States that in the past he has been diagnosed with Bipolar Disorder but currently does not endorse any clear history of hypomania/mania.  He has been prescribed Invega Sustenna 156 mgrs monthly and on Naltrexone 380 mgrs SUSR monthly. He last got medications " about mid August ). History of alcohol use disorder  Is the patient at risk to self? Yes.    Has the patient been a risk to self in the past 6 months? Yes.    Has the patient been a risk to self within the distant past? Yes.    Is the patient a risk to others? No.  Has the patient been a risk to others in the past 6 months? No.  Has the patient been a risk to others within the distant past? No.   Alcohol Screening: Patient refused Alcohol Screening Tool: Yes 1. How often do you have a drink containing alcohol?: 2 to 3  times a week(etoh is negative on admission) 2. How many drinks containing alcohol do you have on a typical day when you are drinking?: 1 or 2("I drink 3-4 beers a week, like 1 beer every other day") 3. How often do you have six or more drinks on one occasion?: Never AUDIT-C Score: 3 4. How often during the last year have you found that you were not able to stop drinking once you had started?: Never 5. How often during the last year have you failed to do what was normally expected from you becasue of drinking?: Never 6. How often during the last year have you needed a first drink in the morning to get yourself going after a heavy drinking session?: Never 7. How often during the last year have you had a feeling of guilt of remorse after drinking?: Never 8. How often during the last year have you been unable to remember what happened the night before because you had been drinking?: Never 9. Have you or someone else been injured as a result of your drinking?: No 10. Has a relative or friend or a doctor or another health worker been concerned about your drinking or suggested you cut down?: No Alcohol Use Disorder Identification Test Final Score (AUDIT): 3 Intervention/Follow-up: Patient Refused Substance Abuse History in the last 12 months:  Yes.   Consequences of Substance Abuse: NA Previous Psychotropic Medications: Yes  Psychological Evaluations: No  Past Medical History:  Past Medical History:  Diagnosis Date  . Diabetes mellitus without complication ([place])   . Hypertension   . MI, acute, non ST segment elevation ([place]) 2007   stents x 2 2007 ([place])  . Stroke [facility]) 2017    Past Surgical History:  Procedure Laterality Date  . CORONARY ARTERY BYPASS GRAFT N/A 06/23/2017   Procedure: CORONARY ARTERY BYPASS GRAFTING (CABG) x 3; Using Left Internal Mammary Artery and Right Leg Greater Saphenous Vein harvested endoscopically.;  Surgeon: Ivin Poot, MD;  Location: [facility];  Service: Open  Heart Surgery;  Laterality: N/A;  . LEFT HEART CATH AND CORONARY ANGIOGRAPHY N/A 06/18/2017   Procedure: LEFT HEART CATH AND CORONARY ANGIOGRAPHY;  Surgeon: Troy Sine, MD;  Location: [facility];  Service: Cardiovascular;  Laterality: N/A;  . TEE WITHOUT CARDIOVERSION N/A 06/23/2017   Procedure: TRANSESOPHAGEAL ECHOCARDIOGRAM (TEE);  Surgeon: Prescott Gum, Collier Salina, MD;  Location: [facility];  Service: Open Heart Surgery;  Laterality: N/A;   Family History:  Family History  Problem Relation Age of Onset  . CAD Mother   . Congestive Heart Failure Father    Family Psychiatric  History: denies history of mental illness in family  Tobacco Screening: Have you used any form of tobacco in the last 30 days? (Cigarettes, Smokeless Tobacco, Cigars, and/or Pipes): Yes Tobacco use, Select all that apply: 5 or more cigarettes per day("I smoke 1/2 pkt of cigarette everyday") Are you interested in Tobacco Cessation Medications?: No, patient refused Counseled patient on smoking cessation including recognizing danger situations, developing coping skills and basic information about quitting provided: Refused/Declined practical counseling Social History:  Social History   Substance and Sexual Activity  Alcohol Use Yes   Comment: 1-2  beers a week     Social History   Substance and Sexual Activity  Drug Use Yes  . Types: Marijuana    Additional Social History: Marital status: Divorced Divorced, when?: 2009 "I've been married twice." What types of issues is patient dealing with in the relationship?: declined to answer Additional relationship information: declined to answer Are you sexually active?: Yes What is your sexual orientation?: heterosexual Has your sexual activity been affected by drugs, alcohol, medication, or emotional stress?: n/a Does patient have children?: Yes How many children?: 2 How is patient's relationship with their children?: daugther 24 lives in [place] contact  with her. son 54 in [place], [place] --no contact in past few years.                         Allergies:  No Known Allergies Lab Results:  Results for orders placed or performed during the hospital encounter of 09/30/17 (from the past 48 hour(s))  Glucose, capillary     Status: Abnormal   Collection Time: 09/30/17  9:29 AM  Result Value Ref Range   Glucose-Capillary 343 (H) 70 - 99 mg/dL  Glucose, capillary     Status: Abnormal   Collection Time: 09/30/17 12:04 PM  Result Value Ref Range   Glucose-Capillary 429 (H) 70 - 99 mg/dL    Blood Alcohol level:  Lab Results  Component Value Date   ETH <10 24/40/1027    Metabolic Disorder Labs:  Lab Results  Component Value Date   HGBA1C 7.4 (H) 06/17/2017   MPG 165.68 06/17/2017   No results found for: PROLACTIN Lab Results  Component Value Date   CHOL 199 06/19/2017   TRIG 83 06/19/2017   HDL 36 (L) 06/19/2017   CHOLHDL 5.5 06/19/2017   VLDL 17 06/19/2017   LDLCALC 146 (H) 06/19/2017   LDLCALC 161 (H) 06/18/2017    Current Medications: Current Facility-Administered Medications  Medication Dose Route Frequency Provider Last Rate Last Dose  . acetaminophen (TYLENOL) tablet 650 mg  650 mg Oral Q6H PRN Lindon Romp A, NP      . alum & mag hydroxide-simeth (MAALOX/MYLANTA) 200-200-20 MG/5ML suspension 30 mL  30 mL Oral Q4H PRN Lindon Romp A, NP      . aspirin EC tablet 325 mg  325 mg Oral Daily Lindon Romp A, NP   325 mg at 09/30/17 1210  . atorvastatin (LIPITOR) tablet 80 mg  80 mg Oral q1800 Lindon Romp A, NP      . furosemide (LASIX) tablet 40 mg  40 mg Oral Daily Lindon Romp A, NP   40 mg at 09/30/17 1210  . [START ON 10/01/2017] glipiZIDE (GLUCOTROL) tablet 5 mg  5 mg Oral QAC breakfast Rozetta Nunnery, NP      . [START ON 10/01/2017] Influenza vac split quadrivalent PF (FLUARIX) injection 0.5 mL  0.5 mL Intramuscular Tomorrow-1000 Cobos, Fernando A, MD      . insulin aspart (novoLOG) injection 0-9 Units  0-9 Units  Subcutaneous TID WC Lindon Romp A, NP   9 Units at 09/30/17 1210  . insulin glargine (LANTUS) injection 30 Units  30 Units Subcutaneous QHS Lindon Romp A, NP      . magnesium hydroxide (MILK OF MAGNESIA) suspension 30 mL  30 mL Oral Daily PRN Rozetta Nunnery, NP      . Melatonin TABS 6 mg  6 mg Oral QHS PRN Rozetta Nunnery, NP      . metFORMIN (  GLUCOPHAGE) tablet 1,000 mg  1,000 mg Oral BID WC Lindon Romp A, NP      . metoprolol tartrate (LOPRESSOR) tablet 50 mg  50 mg Oral BID Lindon Romp A, NP   50 mg at 09/30/17 1210  . potassium chloride 20 MEQ/15ML (10%) solution 20 mEq  20 mEq Oral Daily Lindon Romp A, NP   20 mEq at 09/30/17 1217  . sertraline (ZOLOFT) tablet 25 mg  25 mg Oral Daily Cobos, Fernando A, MD      . traZODone (DESYREL) tablet 100 mg  100 mg Oral QHS Lindon Romp A, NP       PTA Medications: Medications Prior to Admission  Medication Sig Dispense Refill Last Dose  . aspirin EC 325 MG EC tablet Take 1 tablet (325 mg total) by mouth daily. 30 tablet 0 09/29/2017 at 0900  . atorvastatin (LIPITOR) 80 MG tablet Take 1 tablet (80 mg total) by mouth daily at 6 PM. 30 tablet 1 Past Week at Unknown time  . furosemide (LASIX) 40 MG tablet Take 1 tablet (40 mg total) by mouth daily. 7 tablet 0 09/29/2017 at Unknown time  . glipiZIDE (GLUCOTROL) 5 MG tablet Take 5 mg by mouth daily before breakfast.   09/29/2017 at Unknown time  . insulin glargine (LANTUS) 100 UNIT/ML injection Inject 30 Units into the skin at bedtime.   09/27/2017  . Melatonin 3 MG CAPS Take 6 mg by mouth at bedtime as needed (for sleep).   09/27/2017  . metFORMIN (GLUCOPHAGE) 1000 MG tablet Take 1,000 mg by mouth 2 (two) times daily with a meal.   09/28/2017 at Unknown time  . metoprolol tartrate (LOPRESSOR) 50 MG tablet Take 1 tablet (50 mg total) by mouth 2 (two) times daily. 60 tablet 1 09/29/2017 at 0900  . Naltrexone 380 MG SUSR Inject 380 mg into the muscle every 28 (twenty-eight) days.   09/10/2017  . paliperidone (INVEGA  SUSTENNA) 156 MG/ML SUSY injection Inject 156 mg into the muscle every 28 (twenty-eight) days.   09/10/2017  . potassium chloride 20 MEQ TBCR Take 20 mEq by mouth daily. 7 tablet 0 09/27/2017  . traZODone (DESYREL) 100 MG tablet Take 100 mg by mouth at bedtime.   09/28/2017 at Unknown time    Musculoskeletal: Strength & Muscle Tone: within normal limits Gait & Station: normal Patient leans: N/A  Psychiatric Specialty Exam: Physical Exam  Nursing note and vitals reviewed. Constitutional: He is oriented to person, place, and time.  Neurological: He is alert and oriented to person, place, and time.    Review of Systems  Psychiatric/Behavioral: Positive for depression, substance abuse and suicidal ideas. Negative for hallucinations and memory loss. The patient is nervous/anxious and has insomnia.   All other systems reviewed and are negative.   Blood pressure (!) 129/113, pulse 91, temperature 98.4 F (36.9 C), temperature source Oral, resp. rate 20, height 6' (1.829 m), weight 83.9 kg, SpO2 99 %.Body mass index is 25.09 kg/m.  General Appearance: Fairly Groomed  Eye Contact:  Fair  Speech:  Clear and Coherent and Normal Rate  Volume:  Decreased  Mood:  Depressed  Affect:  Constricted and Depressed  Thought Process:  Coherent, Goal Directed, Linear and Descriptions of Associations: Intact  Orientation:  Full (Time, Place, and Person)  Thought Content:  Logical  Suicidal Thoughts:  Yes.  without intent/plan  Homicidal Thoughts:  No  Memory:  Immediate;   Fair Recent;   Fair  Judgement:  Fair  Insight:  Fair  Psychomotor Activity:  Normal  Concentration:  Concentration: Fair and Attention Span: Fair  Recall:  [employer] of Knowledge:  Fair  Language:  Good  Akathisia:  Negative  Handed:  Right  AIMS (if indicated):     Assets:  Communication Skills Desire for Improvement Resilience Social Support  ADL's:  Intact  Cognition:  WNL  Sleep:       Treatment Plan Summary: Daily  contact with patient to assess and evaluate symptoms and progress in treatment  Treatment Plan/Recommendations: 1. Admit for crisis management and stabilization, estimated length of stay 3-5 days.  2. Medication management to reduce current symptoms to base line and improve the patient's overall level of functioning: He remembers having been on Zoloft, and does not remember having had side effects. Start Zoloft 25 mgrs QDAY. As above, patient reports he has been on [place] 156 mgr Q month, and on Naltrexone 380 mgr SUSR Q month, last doses for both of these 2 weeks ago.  He will resume home medications for DM and HTN as noted in MAR 3. Treat health problems as indicated.  4. Develop treatment plan to decrease risk of relapse upon discharge and the need for readmission.  5. Psycho-social education regarding relapse prevention and self care.  6. Health care follow up as needed for medical problems.  7. Review, reconcile, and reinstate any pertinent home medications for other health issues where appropriate. 8. Call for consults with hospitalist for any additional specialty patient care services as needed. 9. TSH, HgbA1c and lipid panel in process. His glucose levels are elevated and hospitalist  consult has been made by MD is is awaiting return phone call. Patient started on Insulin sliding scale.     Physician Treatment Plan for Primary Diagnosis: Severe recurrent major depression without psychotic features ([place]) Long Term Goal(s): Improvement in symptoms so as ready for discharge  Short Term Goals: Ability to identify changes in lifestyle to reduce recurrence of condition will improve, Ability to identify and develop effective coping behaviors will improve, Compliance with prescribed medications will improve and Ability to identify triggers associated with substance abuse/mental health issues will improve  Physician Treatment Plan for Secondary Diagnosis: Principal Problem:   Severe  recurrent major depression without psychotic features ([place])  Long Term Goal(s): Improvement in symptoms so as ready for discharge  Short Term Goals: Ability to verbalize feelings will improve, Ability to disclose and discuss suicidal ideas, Ability to demonstrate self-control will improve and Ability to identify and develop effective coping behaviors will improve  I certify that inpatient services furnished can reasonably be expected to improve the patient's condition.    Marcus Maes, NP 9/4/20192:43 PM   I have discussed case with NP and have met with patient  Agree with NP note and assessment  52, divorced, lives alone, has two adult children, on [place] disability ( served in [place]).  Patient presented to ED due to chest pain. Work up negative. He reported depression, suicidal ideations, and reported recently overdosing on Melatonin 4 days ago  (states he took a " handful", after which he vomited- did not seek care at that time). Describes chronic depression, states he has been feeling depressed " for years ".Describes depression as chronic . Endorses neuro-vegetative symptoms of depression- anhedonia, decreased energy, decreased sleep, poor appetite. Denies psychotic symptoms. Reports depression has been at least partially related to medical issues- he had a CVA in 2017, had bypass surgery in May 2019. States " I feel tired a  lot".  History of prior psychiatric admissions , most recently 2-3 months ago at [facility], for depression.  Reports history of prior suicidal attempts by overdosing . Reports past , not recent, history of auditory hallucinations. States that in the past he has been diagnosed with Bipolar Disorder but currently does not endorse any clear history of hypomania/mania.  He has been prescribed Invega Sustenna 156 mgrs monthly and on Naltrexone 380 mgrs SUSR monthly. He last got medications " about mid August ).   History of alcohol use disorder. States he last drank  4-5 days ago. Reports his drinking has significantly decreased, and recently states he has been drinking 3-4 x per week, one beer per episode . Endorses using cannabis once x week, denies other drug abuse .  Medical History- history of HTN, history of DM, history of CAD, CABG x 3 earlier this year, history of CVA in 2017.  Of note, states he had been non compliant with diabetic medication /insulin x several days prior to admission  Family history- mother died 21 from MI, father alive, has 7 siblings , denies history of mental illness in family  Dx- MDD, consider Depression secondary to Medical Illness. 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 Start Zoloft 25 mgrs QDAY  As above, patient reports he has been on [place] 156 mgr Q month, and on Naltrexone 380 mgr SUSR Q month, last doses for both of these 2 weeks ago.  Have consulted hospitalist service for assistance with DM/ HTN management

## 2017-09-30 NOTE — [facility] Suicide Risk Assessment (Signed)
[facility] INPATIENT:  Family/Significant Other Suicide Prevention Education  Suicide Prevention Education:  Education Completed; Marcus Cummings (pt's sister) [phone] has been identified by the patient as the family member/significant other with whom the patient will be residing, and identified as the person(s) who will aid the patient in the event of a mental health crisis (suicidal ideations/suicide attempt).  With written consent from the patient, the family member/significant other has been provided the following suicide prevention education, prior to the and/or following the discharge of the patient.  The suicide prevention education provided includes the following:  Suicide risk factors  Suicide prevention and interventions  National Suicide Hotline telephone number  [facility] assessment telephone number  [facility] Emergency Assistance 911  [facility] and/or Residential Mobile Crisis Unit telephone number  Request made of family/significant other to:  Remove weapons (e.g., guns, rifles, knives), all items previously/currently identified as safety concern.    Remove drugs/medications (over-the-counter, prescriptions, illicit drugs), all items previously/currently identified as a safety concern.  The family member/significant other verbalizes understanding of the suicide prevention education information provided.  The family member/significant other agrees to remove the items of safety concern listed above.  Marcus Ravens LCSW 09/30/2017, 2:37 PM

## 2017-09-30 NOTE — Tx Team (Signed)
Initial Treatment Plan 09/30/2017 10:17 AM Marcus Bathe Sr. AXE:[id number]    PATIENT STRESSORS: Health problems Substance abuse   PATIENT STRENGTHS: Capable of independent living Motivation for treatment/growth Supportive family/friends   PATIENT IDENTIFIED PROBLEMS: Alteration in mood (Depression)"I'm too depressed, I'm tired all the time, I don't have energy to do anything since my stroke"  Risk for self harm  Alteration in sleep pattern                 DISCHARGE CRITERIA:  Improved stabilization in mood, thinking, and/or behavior Verbal commitment to aftercare and medication compliance  PRELIMINARY DISCHARGE PLAN: Outpatient therapy Return to previous living arrangement  PATIENT/FAMILY INVOLVEMENT: This treatment plan has been presented to and reviewed with the patient, Marcus Tarpey Sr.  The patient have been given the opportunity to ask questions and make suggestions.  Sherryl Manges, RN 09/30/2017, 10:17 AM

## 2017-09-30 NOTE — ED Notes (Signed)
Was unable to get e-sig box for consent to transfer to work properly. Pt consents to transfer, [facility] voluntary consent signed and faxed to [facility] earlier this morning.

## 2017-09-30 NOTE — Progress Notes (Signed)
Inpatient Diabetes Program Recommendations  AACE/ADA: New Consensus Statement on Inpatient Glycemic Control (2015)  Target Ranges:  Prepandial:   less than 140 mg/dL      Peak postprandial:   less than 180 mg/dL (1-2 hours)      Critically ill patients:  140 - 180 mg/dL   Lab Results  Component Value Date   GLUCAP 429 (H) 09/30/2017   HGBA1C 7.4 (H) 06/17/2017   Review of Glycemic Control  Diabetes history: DM 2 Outpatient Diabetes medications: Lantus 30 units qhs, Metformin 1000 mg BID, Glipizide 5 mg Daily Current orders for Inpatient glycemic control: Lantus 30 units, Metformin 1000 mg BID, Glipizide 5 mg Daily, Novolog 0-9 units tid  Inpatient Diabetes Program Recommendations:    Noted patient transferred this am. Novolog was not given for a glucose of 236 at 0623 am, or 343 mg/dl glucose at 2751.   Based on glucose trends and insulin needs during last admission in June consider   -  increasing Novolog Correction to 0-15 units tid  -  Add Novolog 0-5 units qhs  -  Add Novolog 3 units meal coverage if patient consumes at least 50% of meals.  Will watch on current basal insulin dose as it is the patient's home dose.  Thanks,  Christena Deem RN, MSN, BC-ADM Inpatient Diabetes Coordinator Team Pager [phone] (8a-5p)

## 2017-09-30 NOTE — Progress Notes (Signed)
Admission DAR Note: Pt is a 52 y/o male who was transfer from [facility] to [facility] for continuation of care related to depression. Per nursing report, pt went to [facility] with c/o depression with plan to overdose on medication. Pt presents with depressed affect and mood on initial contact. A & O X4. Pt reports passive SI and HI "It's always there but it's more towards myself than anyone else". Denies AVH and physical pain on assessment. Reports increased in depression since his stroke 09/13/15. "I mean it's just everything with life, I'm 52 y/o I had a stroke, I'm always tired, don't have energy to do anything". Per pt "I can't sleep, I started taking sleeping pills but I only get 3-4 hrs /night; I've lost some weight (13 lbs) because I'm not eating well, I'm just sad / lonely" Pt is a veteran, reports multiple admissions at the [place] due to PTSD. Medical problems include diabetes, HTN CABG X2 months (done at [facility]), h/o stroke and substance abuse (cocaine & THC). Last use cocaine on 09/26/17. Skin assessment done and belongings searched per protocol. Pt's skin is intact, multiple old scars noted all over pt's skin especially on bilateral lower extremities. Tattoo noted on left chest.  Emotional support and availability offered. Encouraged pt to voice concerns, attend to ADLS and comply with treatment regimen including groups. Unit orientation done, routines discussed and care plan reviewed with pt; understanding verbalized. Q 15 minutes safety checks initiated without self harm gestures or outburst to note at this time.

## 2017-09-30 NOTE — BHH Group Notes (Signed)
LCSW Group Therapy Note 09/30/2017 11:33 AM  Type of Therapy and Topic: Group Therapy: Overcoming Obstacles  Participation Level: Did Not Attend  Description of Group:  In this group patients will be encouraged to explore what they see as obstacles to their own wellness and recovery. They will be guided to discuss their thoughts, feelings, and behaviors related to these obstacles. The group will process together ways to cope with barriers, with attention given to specific choices patients can make. Each patient will be challenged to identify changes they are motivated to make in order to overcome their obstacles. This group will be process-oriented, with patients participating in exploration of their own experiences as well as giving and receiving support and challenge from other group members.  Therapeutic Goals: 1. Patient will identify personal and current obstacles as they relate to admission. 2. Patient will identify barriers that currently interfere with their wellness or overcoming obstacles.  3. Patient will identify feelings, thought process and behaviors related to these barriers. 4. Patient will identify two changes they are willing to make to overcome these obstacles:   Summary of Patient Progress  Invited, chose not to attend.    Therapeutic Modalities:  Cognitive Behavioral Therapy Solution Focused Therapy Motivational Interviewing Relapse Prevention Therapy   Breshay Ilg LCSWA Clinical Social Worker   
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
## 2017-09-30 NOTE — BHH Suicide Risk Assessment (Addendum)
[facility] Admission Suicide Risk Assessment   Nursing information obtained from:  Patient Demographic factors:  Male, Living alone Current Mental Status:  Suicidal ideation indicated by patient, Self-harm thoughts Loss Factors:  Decline in physical health(stroke, CABG X2 months) Historical Factors:  NA Risk Reduction Factors:  Positive therapeutic relationship(with his sister Priscella Mann)  Total Time spent with patient: 45 minutes  Principal Problem: MDD Diagnosis:   Patient Active Problem List   Diagnosis Date Noted  . Severe recurrent major depression without psychotic features (HCC) [F33.2] 09/30/2017  . 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 Essential hypertension [I10]   . Dyslipidemia [E78.5]   . Chest pain on exertion [R07.9]   . Stable angina (HCC) [I20.8] 06/17/2017   Subjective Data:   Continued Clinical Symptoms:  Alcohol Use Disorder Identification Test Final Score (AUDIT): 3 The "Alcohol Use Disorders Identification Test", Guidelines for Use in Primary Care, Second Edition.  World Science writer [facility]). Score between 0-7:  no or low risk or alcohol related problems. Score between 8-15:  moderate risk of alcohol related problems. Score between 16-19:  high risk of alcohol related problems. Score 20 or above:  warrants further diagnostic evaluation for alcohol dependence and treatment.   CLINICAL FACTORS:  52, divorced, lives alone, has two adult children, on [place] disability ( served in [employer]).  Patient presented to ED due to chest pain. Work up negative. He reported depression, suicidal ideations, and reported recently overdosing on Melatonin 4 days ago  (states he took a " handful", after which he vomited- did not seek care at that time). Describes chronic depression, states he has been feeling depressed " for years ".Describes depression as chronic . Endorses neuro-vegetative symptoms of depression- anhedonia, decreased energy, decreased sleep, poor appetite. Denies  psychotic symptoms. Reports depression has been at least partially related to medical issues- he had a CVA in 2017, had bypass surgery in May 2019. States " I feel tired a lot".  History of prior psychiatric admissions , most recently 2-3 months ago at [facility], for depression.  Reports history of prior suicidal attempts by overdosing . Reports past , not recent, history of auditory hallucinations. States that in the past he has been diagnosed with Bipolar Disorder but currently does not endorse any clear history of hypomania/mania.  He has been prescribed Invega Sustenna 156 mgrs monthly and on Naltrexone 380 mgrs SUSR monthly. He last got medications " about mid August ).   History of alcohol use disorder. States he last drank 4-5 days ago. Reports his drinking has significantly decreased, and recently states he has been drinking 3-4 x per week, one beer per episode . Endorses using cannabis once x week, denies other drug abuse .  Medical History- history of HTN, history of DM, history of CAD, CABG x 3 earlier this year, history of CVA in 2017.  Of note, states he had been non compliant with diabetic medication /insulin x several days prior to admission  Family history- mother died 72 from MI, father alive, has 7 siblings , denies history of mental illness in family  Dx- MDD, consider Depression secondary to Medical Illness. 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 Start Zoloft 25 mgrs QDAY  As above, patient reports he has been on [place] 156 mgr Q month, and on Naltrexone 380 mgr SUSR Q month, last doses for both of these 2 weeks ago.  Have consulted hospitalist service for assistance with DM/ HTN management     Musculoskeletal: Strength & Muscle Tone: within normal limits Gait & Station: normal Patient leans: N/A  Psychiatric Specialty Exam: Physical Exam   ROS no headache, no chest pain, no shortness of breath, no vomiting, no fever , no chills  Blood pressure (!) 129/113, pulse 91, temperature 98.4 F (36.9 C), temperature source Oral, resp. rate 20, height 6' (1.829 m), weight 83.9 kg, SpO2 99 %.Body mass index is 25.09 kg/m.  Repeat 134/81 , pulse 74   General Appearance: Fairly Groomed  Eye Contact:  Fair  Speech:  Normal Rate  Volume:  Decreased  Mood:  Depressed  Affect:  Constricted  Thought Process:  Linear and Descriptions of Associations: Intact  Orientation:  Other:  fully alert and attentive   Thought Content:  no hallucinations, no delusions  Suicidal Thoughts:  No denies any current suicidal  plan or intention   Homicidal Thoughts:  No denies homicidal ideations  Memory:  recent and remote grossly intact   Judgement:  Fair  Insight:  Fair  Psychomotor Activity:  Decreased- no current tremors or diaphoresis, not in any acute distress   Concentration:  Concentration: Good and Attention Span: Good  Recall:  Good  Fund of Knowledge:  Good  Language:  Good  Akathisia:  Negative  Handed:  Right  AIMS (if indicated):     Assets:  Communication Skills Desire for Improvement Resilience  ADL's:  Intact  Cognition:  WNL  Sleep:         COGNITIVE FEATURES THAT CONTRIBUTE TO RISK:  Closed-mindedness and Loss of executive function    SUICIDE RISK:   Moderate:  Frequent suicidal ideation with limited intensity, and duration, some specificity in terms of plans, no associated intent, good self-control, limited dysphoria/symptomatology, some risk factors present, and identifiable protective factors, including available and accessible social support.  PLAN OF CARE: Patient will be admitted to inpatient psychiatric unit for stabilization and safety. Will provide and encourage milieu participation. Provide medication management and maked adjustments as needed.  Will follow daily.    I certify that inpatient services furnished  can reasonably be expected to improve the patient's condition.   Craige Cotta, MD 09/30/2017, 1:48 PM

## 2017-09-30 NOTE — Tx Team (Addendum)
Interdisciplinary Treatment and Diagnostic Plan Update  09/30/2017 Time of Session: [place] MRN: [id number]  Principal Diagnosis: MDD, recurrent severe without psychosis   Secondary Diagnoses: Active Problems:   Severe recurrent major depression without psychotic features (HCC)   Current Medications:  Current Facility-Administered Medications  Medication Dose Route Frequency Provider Last Rate Last Dose  . acetaminophen (TYLENOL) tablet 650 mg  650 mg Oral Q6H PRN Rozetta Nunnery, NP      . alum & mag hydroxide-simeth (MAALOX/MYLANTA) 200-200-20 MG/5ML suspension 30 mL  30 mL Oral Q4H PRN Rozetta Nunnery, NP      . aspirin EC tablet 325 mg  325 mg Oral Daily Lindon Romp A, NP      . atorvastatin (LIPITOR) tablet 80 mg  80 mg Oral q1800 Lindon Romp A, NP      . furosemide (LASIX) tablet 40 mg  40 mg Oral Daily Rozetta Nunnery, NP      . Derrill Memo ON 10/01/2017] glipiZIDE (GLUCOTROL) tablet 5 mg  5 mg Oral QAC breakfast Lindon Romp A, NP      . insulin aspart (novoLOG) injection 0-9 Units  0-9 Units Subcutaneous TID WC Lindon Romp A, NP      . insulin glargine (LANTUS) injection 30 Units  30 Units Subcutaneous QHS Lindon Romp A, NP      . magnesium hydroxide (MILK OF MAGNESIA) suspension 30 mL  30 mL Oral Daily PRN Rozetta Nunnery, NP      . Melatonin TABS 6 mg  6 mg Oral QHS PRN Lindon Romp A, NP      . metFORMIN (GLUCOPHAGE) tablet 1,000 mg  1,000 mg Oral BID WC Lindon Romp A, NP      . metoprolol tartrate (LOPRESSOR) tablet 50 mg  50 mg Oral BID Lindon Romp A, NP      . potassium chloride 20 MEQ/15ML (10%) solution 20 mEq  20 mEq Oral Daily Lindon Romp A, NP      . traZODone (DESYREL) tablet 100 mg  100 mg Oral QHS Lindon Romp A, NP       PTA Medications: Medications Prior to Admission  Medication Sig Dispense Refill Last Dose  . aspirin EC 325 MG EC tablet Take 1 tablet (325 mg total) by mouth daily. 30 tablet 0 09/29/2017 at 0900  . atorvastatin (LIPITOR) 80 MG  tablet Take 1 tablet (80 mg total) by mouth daily at 6 PM. 30 tablet 1 Past Week at Unknown time  . furosemide (LASIX) 40 MG tablet Take 1 tablet (40 mg total) by mouth daily. 7 tablet 0 09/29/2017 at Unknown time  . glipiZIDE (GLUCOTROL) 5 MG tablet Take 5 mg by mouth daily before breakfast.   09/29/2017 at Unknown time  . insulin glargine (LANTUS) 100 UNIT/ML injection Inject 30 Units into the skin at bedtime.   09/27/2017  . Melatonin 3 MG CAPS Take 6 mg by mouth at bedtime as needed (for sleep).   09/27/2017  . metFORMIN (GLUCOPHAGE) 1000 MG tablet Take 1,000 mg by mouth 2 (two) times daily with a meal.   09/28/2017 at Unknown time  . metoprolol tartrate (LOPRESSOR) 50 MG tablet Take 1 tablet (50 mg total) by mouth 2 (two) times daily. 60 tablet 1 09/29/2017 at 0900  . Naltrexone 380 MG SUSR Inject 380 mg into the muscle every 28 (twenty-eight) days.   09/10/2017  . paliperidone (INVEGA SUSTENNA) 156 MG/ML SUSY injection Inject 156 mg into the muscle every 28 (twenty-eight)  days.   09/10/2017  . potassium chloride 20 MEQ TBCR Take 20 mEq by mouth daily. 7 tablet 0 09/27/2017  . traZODone (DESYREL) 100 MG tablet Take 100 mg by mouth at bedtime.   09/28/2017 at Unknown time    Patient Stressors: Health problems Substance abuse  Patient Strengths: Capable of independent living Motivation for treatment/growth Supportive family/friends  Treatment Modalities: Medication Management, Group therapy, Case management,  1 to 1 session with clinician, Psychoeducation, Recreational therapy.   Physician Treatment Plan for Primary Diagnosis: MDD, recurrent severe without psychosis   Medication Management: Evaluate patient's response, side effects, and tolerance of medication regimen.  Therapeutic Interventions: 1 to 1 sessions, Unit Group sessions and Medication administration.  Evaluation of Outcomes: Not Met  Physician Treatment Plan for Secondary Diagnosis: Active Problems:   Severe recurrent major depression  without psychotic features ([place])   Medication Management: Evaluate patient's response, side effects, and tolerance of medication regimen.  Therapeutic Interventions: 1 to 1 sessions, Unit Group sessions and Medication administration.  Evaluation of Outcomes: Not Met   RN Treatment Plan for Primary Diagnosis: MDD, recurrent severe without psychosis  Long Term Goal(s): Knowledge of disease and therapeutic regimen to maintain health will improve  Short Term Goals: Ability to remain free from injury will improve, Ability to verbalize frustration and anger appropriately will improve, Ability to disclose and discuss suicidal ideas and Ability to identify and develop effective coping behaviors will improve  Medication Management: RN will administer medications as ordered by provider, will assess and evaluate patient's response and provide education to patient for prescribed medication. RN will report any adverse and/or side effects to prescribing provider.  Therapeutic Interventions: 1 on 1 counseling sessions, Psychoeducation, Medication administration, Evaluate responses to treatment, Monitor vital signs and CBGs as ordered, Perform/monitor CIWA, COWS, AIMS and Fall Risk screenings as ordered, Perform wound care treatments as ordered.  Evaluation of Outcomes: Not Met   LCSW Treatment Plan for Primary Diagnosis: MDD, recurrent severe without psychosis  Long Term Goal(s): Safe transition to appropriate next level of care at discharge, Engage patient in therapeutic group addressing interpersonal concerns.  Short Term Goals: Engage patient in aftercare planning with referrals and resources, Facilitate patient progression through stages of change regarding substance use diagnoses and concerns and Identify triggers associated with mental health/substance abuse issues  Therapeutic Interventions: Assess for all discharge needs, 1 to 1 time with Social worker, Explore available resources and support  systems, Assess for adequacy in community support network, Educate family and significant other(s) on suicide prevention, Complete Psychosocial Assessment, Interpersonal group therapy.  Evaluation of Outcomes: Not Met   Progress in Treatment: Attending groups: No.Pt new to unit. Continuing to assess.  Participating in groups: No. Taking medication as prescribed: Yes. Toleration medication: Yes. Family/Significant other contact made: No CSW assessing and will contact family member with pt consent.  Patient understands diagnosis: Yes. Pt minimizing substance use. Pt denies recent drug or alcohol abuse--UDS pos for cocaine.  Discussing patient identified problems/goals with staff: Yes. Medical problems stabilized or resolved: Yes. Denies suicidal/homicidal ideation: Yes. Issues/concerns per patient self-inventory: No. Other: n/a   New problem(s) identified: No, Describe:  n/a  New Short Term/Long Term Goal(s): detox, medication management for mood stabilization; elimination of SI thoughts; development of comprehensive mental wellness/sobriety plan.   Patient Goals:  "To get help for my depression and lack of energy."   Discharge Plan or Barriers: CSW assessing for appropriate referrals. [facility] pamphlet, Mobile Crisis information, and AA/NA information provided to patient  for additional community support and resources.   Reason for Continuation of Hospitalization: Anxiety Depression Medication stabilization Withdrawal symptoms  Suicidal Ideations  Estimated Length of Stay: Monday, 10/05/17  Attendees: Patient: 09/30/2017 10:48 AM  Physician: Dr. Parke Poisson MD; Dr. Nancy Fetter MD 09/30/2017 10:48 AM  Nursing: Yetta Flock RN; [facility] RN 09/30/2017 10:48 AM  RN Care Manager:x 09/30/2017 10:48 AM  Social Worker: Janice Norrie LCSW 09/30/2017 10:48 AM  Recreational Therapist: x 09/30/2017 10:48 AM  Other: Lindell Spar NP; Mordecai Maes NP 09/30/2017 10:48 AM  Other:  09/30/2017 10:48 AM  Other: 09/30/2017 10:48  AM    Scribe for Treatment Team: Avelina Laine, LCSW 09/30/2017 10:48 AM

## 2017-10-01 ENCOUNTER — Encounter (HOSPITAL_COMMUNITY): Payer: Self-pay | Admitting: Behavioral Health

## 2017-10-01 LAB — GLUCOSE, CAPILLARY
GLUCOSE-CAPILLARY: 236 mg/dL — AB (ref 70–99)
GLUCOSE-CAPILLARY: 247 mg/dL — AB (ref 70–99)
Glucose-Capillary: 133 mg/dL — ABNORMAL HIGH (ref 70–99)
Glucose-Capillary: 128 mg/dL — ABNORMAL HIGH (ref 70–99)

## 2017-10-01 LAB — LIPID PANEL
CHOLESTEROL: 209 mg/dL — AB (ref 0–200)
HDL: 32 mg/dL — AB (ref >40)
LDL Cholesterol: 151 mg/dL — ABNORMAL HIGH (ref 0–99)
Total CHOL/HDL Ratio: 6.5 RATIO
Triglycerides: 128 mg/dL (ref <150)
VLDL: 26 mg/dL (ref 0–40)

## 2017-10-01 LAB — TSH: TSH: 2.803 u[IU]/mL (ref 0.350–4.500)

## 2017-10-01 LAB — HEMOGLOBIN A1C
HEMOGLOBIN A1C: 10 % — AB (ref 4.8–5.6)
Mean Plasma Glucose: 240.3 mg/dL

## 2017-10-01 MED ORDER — SERTRALINE HCL 50 MG PO TABS
50.0000 mg | ORAL_TABLET | Freq: Every day | ORAL | Status: DC
  Administered 2017-10-02 – 2017-10-04 (×3): 50 mg via ORAL
  Filled 2017-10-01 (×4): qty 1

## 2017-10-01 NOTE — Progress Notes (Signed)
Nutrition Brief Note  Patient identified on the Malnutrition Screening Tool (MST) Report  Wt Readings from Last 15 Encounters:  09/30/17 83.9 kg  09/29/17 86.2 kg  07/03/17 86 kg    Body mass index is 25.09 kg/m. Patient meets criteria for overweight based on current BMI. Per chart review, current weight is 184 lb and weight on 6/7 was 189 lb. This indicates 5 lb weight loss (2.6% body weight) in the past 2 months; not significant for time frame.   Patient admitted to [facility] for depression and SI. Current diet order is Regular. Labs and medications reviewed.   No nutrition interventions warranted at this time. If nutrition issues arise, please consult RD.     Trenton Gammon, MS, RD, LDN, [facility] Clinical Dietitian Pager # [phone] After hours/weekend pager # [phone]

## 2017-10-01 NOTE — Progress Notes (Signed)
Pt BS= 297.  Pt receives sliding scale coverage.  Pt attends group.  Pt is quiet and reserved but does answer assessment questions.  Pt is hoping for.  Pt denies SI, HI and AVh, Contracts for safety.  Pt denies pain or discomfort at this time. Pt is med compliant and returns to room and is sleeping at this time.

## 2017-10-01 NOTE — Progress Notes (Signed)
Psychoeducational Group Note  Date:  10/01/2017 Time:2100  Group Topic/Focus:  wrap up group  Participation Level: Did Not Attend  Participation Quality:  Not Applicable  Affect:  Not Applicable  Cognitive:  Not Applicable  Insight:  Not Applicable  Engagement in Group: Not Applicable  Additional Comments:  Pt was notified that group was beginning but remained in bed.   Marcille Buffy 10/01/2017, 9:41 PM

## 2017-10-01 NOTE — Plan of Care (Signed)
  Problem: Education: Goal: Verbalization of understanding the information provided will improve Outcome: Progressing   Problem: Health Behavior/Discharge Planning: Goal: Identification of resources available to assist in meeting health care needs will improve Outcome: Progressing   Problem: Safety: Goal: Periods of time without injury will increase Outcome: Progressing

## 2017-10-01 NOTE — Progress Notes (Signed)
Patient ID: Marcus ELLWANGER Sr., male   DOB: [birth year], 52 y.o.   MRN: [id number] D: Patient has been in bed most of the evening. When pt awoken mood and and affect appeared depressed and flat otherwise pleasant. Pt reports he has thought of suicide all the time but has no plan or intent. Denies HI/AVH and pain.No behavioral issues noted.  A: Support and encouragement offered as needed to express needs. Medications administered as prescribed.  R: Patient is safe and cooperative on unit. Will continue to monitor  for safety and stability.

## 2017-10-01 NOTE — Progress Notes (Addendum)
[facility] MD Progress Note  10/01/2017 11:19 AM Marcus Bathe Sr.  MRN:  [id number]   Subjective:  " I pretty much feel the same. I am hoping I can go to a [place] facility when I leave here."  Evaluation on the unit: Face to face evaluation completed, case discussed with treatment team and chart reviewed. Patient is a 52 year old male who was admitted to the unit following suicidal thoughts.   On evaluation, patient is alert and oriented x4, calm and cooperative. He continues to endorse ongoing depression without improvement. He denies any suicidal thoughts at this time.Denies homicidal ideation or AVH as well as other psychotic processes although he did present with a history remarkable for AH. He does not appear internally preoccupied. He has a history of alcohol abuse although on admission he reported he use had significantly dropped. He denies withdrawal symptoms and does not appear to be in a withdrawal state. Pt has a history of PTSD although denies related symptoms.  He endorse no concerns with appetite and poor sleep. He was started on  Zoloft 25 mgrs QDAY and thus far he is tolerating the medications well. He is also on  [place] 156 mgr Q month, and on Naltrexone 380 mgr SUSR Q month, last doses for both of these 2 weeks ago. And he will resume these following discharge as [previosuly prescribed. Patient is a veteran and connected to the [place] and has hopes to be discharge to a [place] facility although he does understand that he may have t transition form a facility if accepted from home. He does have stable housing. At this time, he is contracting for safety on the unit.   Principal Problem: Severe recurrent major depression without psychotic features (HCC) Diagnosis:   Patient Active Problem List   Diagnosis Date Noted  . Severe recurrent major depression without psychotic features (HCC) [F33.2] 09/30/2017  . 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 Essential hypertension [I10]   . Dyslipidemia  [E78.5]   . Chest pain on exertion [R07.9]   . Stable angina (HCC) [I20.8] 06/17/2017   Total Time spent with patient: 20 minutes  Past Psychiatric History: istory of prior psychiatric admissions , most recently 2-3 months ago at [facility], for depression.  Reports history of prior suicidal attempts by overdosing . Reports past , not recent, history of auditory hallucinations. States that in the past he has been diagnosed with Bipolar Disorder but currently does not endorse any clear history of hypomania/mania.  He has been prescribed Invega Sustenna 156 mgrs monthly and on Naltrexone 380 mgrs SUSR monthly. He last got medications " about mid August ). History of alcohol use disorder   Past Medical History:  Past Medical History:  Diagnosis Date  . Diabetes mellitus without complication (HCC)   . Hypertension   . MI, acute, non ST segment elevation (HCC) 2007   stents x 2 2007 [facility] [place])  . Stroke [facility]) 2017    Past Surgical History:  Procedure Laterality Date  . CORONARY ARTERY BYPASS GRAFT N/A 06/23/2017   Procedure: CORONARY ARTERY BYPASS GRAFTING (CABG) x 3; Using Left Internal Mammary Artery and Right Leg Greater Saphenous Vein harvested endoscopically.;  Surgeon: Kerin Perna, MD;  Location: [facility] OR;  Service: Open Heart Surgery;  Laterality: N/A;  . LEFT HEART CATH AND CORONARY ANGIOGRAPHY N/A 06/18/2017   Procedure: LEFT HEART CATH AND CORONARY ANGIOGRAPHY;  Surgeon: Lennette Bihari, MD;  Location: [facility] INVASIVE CV LAB;  Service:  Cardiovascular;  Laterality: N/A;  . TEE WITHOUT CARDIOVERSION N/A 06/23/2017   Procedure: TRANSESOPHAGEAL ECHOCARDIOGRAM (TEE);  Surgeon: Donata Clay, Theron Arista, MD;  Location: [facility] OR;  Service: Open Heart Surgery;  Laterality: N/A;   Family History:  Family History  Problem Relation Age of Onset  . CAD Mother   . Congestive Heart Failure Father    Family Psychiatric  History: denies history of mental illness in family Social History:  Social History    Substance and Sexual Activity  Alcohol Use Yes   Comment: 1-2  beers a week     Social History   Substance and Sexual Activity  Drug Use Yes  . Types: Marijuana    Social History   Socioeconomic History  . Marital status: Single    Spouse name: Not on file  . Number of children: Not on file  . Years of education: Not on file  . Highest education level: Not on file  Occupational History  . Not on file  Social Needs  . Financial resource strain: Not on file  . Food insecurity:    Worry: Not on file    Inability: Not on file  . Transportation needs:    Medical: Not on file    Non-medical: Not on file  Tobacco Use  . Smoking status: Never Smoker  . Smokeless tobacco: Never Used  Substance and Sexual Activity  . Alcohol use: Yes    Comment: 1-2  beers a week  . Drug use: Yes    Types: Marijuana  . Sexual activity: Not on file  Lifestyle  . Physical activity:    Days per week: Not on file    Minutes per session: Not on file  . Stress: Not on file  Relationships  . Social connections:    Talks on phone: Not on file    Gets together: Not on file    Attends religious service: Not on file    Active member of club or organization: Not on file    Attends meetings of clubs or organizations: Not on file    Relationship status: Not on file  Other Topics Concern  . Not on file  Social History Narrative  . Not on file   Additional Social History:      Sleep: Poor  Appetite:  Fair  Current Medications: Current Facility-Administered Medications  Medication Dose Route Frequency Provider Last Rate Last Dose  . acetaminophen (TYLENOL) tablet 650 mg  650 mg Oral Q6H PRN Nira Conn A, NP      . alum & mag hydroxide-simeth (MAALOX/MYLANTA) 200-200-20 MG/5ML suspension 30 mL  30 mL Oral Q4H PRN Nira Conn A, NP      . aspirin EC tablet 325 mg  325 mg Oral Daily Nira Conn A, NP   325 mg at 10/01/17 0810  . atorvastatin (LIPITOR) tablet 80 mg  80 mg Oral q1800  Nira Conn A, NP   80 mg at 09/30/17 1724  . furosemide (LASIX) tablet 40 mg  40 mg Oral Daily Nira Conn A, NP   40 mg at 10/01/17 0810  . Influenza vac split quadrivalent PF (FLUARIX) injection 0.5 mL  0.5 mL Intramuscular Tomorrow-1000 Rynell Ciotti A, MD      . insulin aspart (novoLOG) injection 0-15 Units  0-15 Units Subcutaneous TID WC Shon Hale, MD   2 Units at 10/01/17 0607  . insulin aspart (novoLOG) injection 0-5 Units  0-5 Units Subcutaneous QHS Shon Hale, MD   3 Units at 09/30/17  2224  . insulin aspart (novoLOG) injection 3 Units  3 Units Subcutaneous TID WC Shon Hale, MD   3 Units at 10/01/17 0608  . insulin glargine (LANTUS) injection 30 Units  30 Units Subcutaneous QHS Shon Hale, MD   30 Units at 09/30/17 2225  . lisinopril (PRINIVIL,ZESTRIL) tablet 5 mg  5 mg Oral Daily Emokpae, Courage, MD   5 mg at 10/01/17 0810  . magnesium hydroxide (MILK OF MAGNESIA) suspension 30 mL  30 mL Oral Daily PRN Jackelyn Poling, NP      . Melatonin TABS 6 mg  6 mg Oral QHS PRN Nira Conn A, NP      . metFORMIN (GLUCOPHAGE) tablet 1,000 mg  1,000 mg Oral BID WC Nira Conn A, NP   1,000 mg at 10/01/17 0810  . metoprolol tartrate (LOPRESSOR) tablet 50 mg  50 mg Oral BID Nira Conn A, NP   50 mg at 10/01/17 0810  . potassium chloride 20 MEQ/15ML (10%) solution 20 mEq  20 mEq Oral Daily Nira Conn A, NP   20 mEq at 10/01/17 0818  . sertraline (ZOLOFT) tablet 25 mg  25 mg Oral Daily Ida Milbrath, Rockey Situ, MD   25 mg at 10/01/17 0810  . traZODone (DESYREL) tablet 100 mg  100 mg Oral QHS Nira Conn A, NP   100 mg at 09/30/17 2145    Lab Results:  Results for orders placed or performed during the hospital encounter of 09/30/17 (from the past 48 hour(s))  Glucose, capillary     Status: Abnormal   Collection Time: 09/30/17  9:29 AM  Result Value Ref Range   Glucose-Capillary 343 (H) 70 - 99 mg/dL  Glucose, capillary     Status: Abnormal   Collection Time: 09/30/17  12:04 PM  Result Value Ref Range   Glucose-Capillary 429 (H) 70 - 99 mg/dL  Glucose, capillary     Status: Abnormal   Collection Time: 09/30/17  5:22 PM  Result Value Ref Range   Glucose-Capillary 383 (H) 70 - 99 mg/dL  Glucose, capillary     Status: Abnormal   Collection Time: 09/30/17  9:59 PM  Result Value Ref Range   Glucose-Capillary 297 (H) 70 - 99 mg/dL   Comment 1 Notify RN   Glucose, capillary     Status: Abnormal   Collection Time: 10/01/17  5:58 AM  Result Value Ref Range   Glucose-Capillary 133 (H) 70 - 99 mg/dL  Hemoglobin T6R     Status: Abnormal   Collection Time: 10/01/17  6:20 AM  Result Value Ref Range   Hgb A1c MFr Bld 10.0 (H) 4.8 - 5.6 %    Comment: (NOTE) Pre diabetes:          5.7%-6.4% Diabetes:              >6.4% Glycemic control for   <7.0% adults with diabetes    Mean Plasma Glucose 240.3 mg/dL    Comment: Performed at [facility], [address] [place], [place] [address]  Lipid panel     Status: Abnormal   Collection Time: 10/01/17  6:20 AM  Result Value Ref Range   Cholesterol 209 (H) 0 - 200 mg/dL   Triglycerides 400 <867 mg/dL   HDL 32 (L) >61 mg/dL   Total CHOL/HDL Ratio 6.5 RATIO   VLDL 26 0 - 40 mg/dL   LDL Cholesterol 950 (H) 0 - 99 mg/dL    Comment:        Total  Cholesterol/HDL:CHD Risk Coronary Heart Disease Risk Table                     Men   Women  1/2 Average Risk   3.4   3.3  Average Risk       5.0   4.4  2 X Average Risk   9.6   7.1  3 X Average Risk  23.4   11.0        Use the calculated Patient Ratio above and the CHD Risk Table to determine the patient's CHD Risk.        ATP III CLASSIFICATION (LDL):  <100     mg/dL   Optimal  161-096  mg/dL   Near or Above                    Optimal  130-159  mg/dL   Borderline  045-409  mg/dL   High  >811     mg/dL   Very High Performed at [facility], [address] [place], [place] [address]   TSH     Status: None   Collection Time: 10/01/17   6:20 AM  Result Value Ref Range   TSH 2.803 0.350 - 4.500 uIU/mL    Comment: Performed by a 3rd Generation assay with a functional sensitivity of <=0.01 uIU/mL. Performed at [facility] [place] [facility], [address], [place], [place] [address]     Blood Alcohol level:  Lab Results  Component Value Date   ETH <10 09/29/2017    Metabolic Disorder Labs: Lab Results  Component Value Date   HGBA1C 10.0 (H) 10/01/2017   MPG 240.3 10/01/2017   MPG 165.68 06/17/2017   No results found for: PROLACTIN Lab Results  Component Value Date   CHOL 209 (H) 10/01/2017   TRIG 128 10/01/2017   HDL 32 (L) 10/01/2017   CHOLHDL 6.5 10/01/2017   VLDL 26 10/01/2017   LDLCALC 151 (H) 10/01/2017   LDLCALC 146 (H) 06/19/2017    Physical Findings: AIMS: Facial and Oral Movements Muscles of Facial Expression: None, normal Lips and Perioral Area: None, normal Jaw: None, normal Tongue: None, normal,Extremity Movements Upper (arms, wrists, hands, fingers): None, normal Lower (legs, knees, ankles, toes): None, normal, Trunk Movements Neck, shoulders, hips: None, normal, Overall Severity Severity of abnormal movements (highest score from questions above): None, normal Incapacitation due to abnormal movements: None, normal Patient's awareness of abnormal movements (rate only patient's report): No Awareness, Dental Status Current problems with teeth and/or dentures?: Yes Does patient usually wear dentures?: No  CIWA:    COWS:     Musculoskeletal: Strength & Muscle Tone: within normal limits Gait & Station: normal Patient leans: N/A  Psychiatric Specialty Exam: Physical Exam  Nursing note and vitals reviewed. Constitutional: He is oriented to person, place, and time.  Neurological: He is alert and oriented to person, place, and time.    Review of Systems  Psychiatric/Behavioral: Positive for depression, substance abuse and suicidal ideas. Negative for hallucinations and memory loss. The  patient has insomnia. The patient is not nervous/anxious.   All other systems reviewed and are negative.   Blood pressure 126/81, pulse 75, temperature 98.4 F (36.9 C), temperature source Oral, resp. rate 20, height 6' (1.829 m), weight 83.9 kg, SpO2 99 %.Body mass index is 25.09 kg/m.  General Appearance: Fairly Groomed  Eye Contact:  Fair  Speech:  Clear and Coherent and Normal Rate  Volume:  Decreased  Mood:  Depressed  Affect:  Depressed  Thought Process:  Coherent, Goal Directed, Linear and Descriptions of Associations: Intact  Orientation:  Full (Time, Place, and Person)  Thought Content:  Logical  Suicidal Thoughts:  No  Homicidal Thoughts:  No  Memory:  Immediate;   Fair Recent;   Fair  Judgement:  Fair  Insight:  Fair  Psychomotor Activity:  Normal  Concentration:  Concentration: Fair and Attention Span: Fair  Recall:  [employer] of Knowledge:  Fair  Language:  Good  Akathisia:  Negative  Handed:  Right  AIMS (if indicated):     Assets:  Communication Skills Desire for Improvement Resilience Social Support  ADL's:  Intact  Cognition:  WNL  Sleep:  Number of Hours: 6.75     Treatment Plan Summary: Reviewed current treatment plan 10/01/2017. Will continue the following plan with adjustments where noted.   Daily contact with patient to assess and evaluate symptoms and progress in treatment   Medication management: Psychiatric conditions are unstable at this time. To aconitine to reduce current symptoms to base line and improve the patient's overall level of functioning will increase Zoloft to 50 mg po daily for depression, Trazodone 100 m gpo daily as needed at bedtime. He will resume [place] 156 mgr Q month, and on Naltrexone 380 mgr SUSR Q month 9last doses for both of these 2 weeks ago) with outpatient provider.Will esume home medications for DM and HTN as noted in [facility].  Will continue glucose monitoring which seems to be improving.    Other:  Safety:  Will continue 15 minute observation for safety checks. Patient is able to contract for safety on the unit at this time  Continue to develop treatment plan to decrease risk of relapse upon discharge and to reduce the need for readmission.  Psycho-social education regarding relapse prevention and self care.  Health care follow up as needed for medical problems.  Continue to attend and participate in therapy.   Labs: TSH normal, cholesterol 209, HDL 32 and LDL 151, HgbA1c 10.0 he did report on admission he had been non complaint with Diabetes medication. MD have consulted hospitalist service for assistance with DM/ HTN management  He is on sliding scale as well as Lantus at this time. TSH postive for cocaine and THC on admission. Ethanol negative.   Discharge: On going. CSW will continue to work on discharge disposition.   Marcus Magnuson, NP 10/01/2017, 11:19 AM    ..Agree with NP Progress Note

## 2017-10-02 DIAGNOSIS — E119 Type 2 diabetes mellitus without complications: Secondary | ICD-10-CM

## 2017-10-02 DIAGNOSIS — F4310 Post-traumatic stress disorder, unspecified: Secondary | ICD-10-CM

## 2017-10-02 DIAGNOSIS — I10 Essential (primary) hypertension: Secondary | ICD-10-CM

## 2017-10-02 LAB — GLUCOSE, CAPILLARY
GLUCOSE-CAPILLARY: 100 mg/dL — AB (ref 70–99)
Glucose-Capillary: 280 mg/dL — ABNORMAL HIGH (ref 70–99)
Glucose-Capillary: 372 mg/dL — ABNORMAL HIGH (ref 70–99)
Glucose-Capillary: 141 mg/dL — ABNORMAL HIGH (ref 70–99)

## 2017-10-02 MED ORDER — POTASSIUM CHLORIDE CRYS ER 20 MEQ PO TBCR
20.0000 meq | EXTENDED_RELEASE_TABLET | Freq: Every day | ORAL | Status: DC
  Administered 2017-10-03 – 2017-10-13 (×11): 20 meq via ORAL
  Filled 2017-10-02 (×8): qty 1
  Filled 2017-10-02 (×2): qty 14
  Filled 2017-10-02 (×5): qty 1

## 2017-10-02 NOTE — Progress Notes (Signed)
D: Pt was in bed in his room upon initial approach.  Pt presents with depressed affect and mood.  Describes his day as "so so" and denies having a goal.  Writer and pt made goal for pt to "be safe."  Pt denies HI, denies hallucinations, reports bilateral leg pain of 4/10.  He endorses SI without a plan, stating "they always there" referring to suicidal thoughts.  Pt verbally contracts for safety.  Pt has been in his room for the majority of the evening.  He came to medication window for HS medications.    A: Introduced self to pt.  Actively listened to pt and offered support and encouragement. Medications administered per order.  PRN medication administered for pain.  Q15 minute safety checks maintained.  R: Pt is safe on the unit.  Pt is compliant with medications.  Pt verbally contracts for safety.  Will continue to monitor and assess.

## 2017-10-02 NOTE — Progress Notes (Addendum)
[facility] MD Progress Note  10/02/2017 11:52 AM Marcus Bathe Sr.  MRN:  [id number]   Subjective:  " I been going to the meetings but the pill isnt doing too good. I dont feel it working for me. I still feel depressed. "I went to the relaxation and NA group yesterday. "   Evaluation on the unit: Face to face evaluation completed, case discussed with treatment team and chart reviewed. Patient is a 52 year old male who was admitted to the unit following suicidal thoughts.   On evaluation, patient is alert and oriented x4, calm and cooperative.He is observed lying in bed, during recreational time. He is minimally seen active on the unit, and attends very few groups. As previously noted he is encouraged to participate in his treatment paln. He is advised to develop goals and outcomes during this admission in the hospital. He continues to provide vague answers and minimally responsive when disusing discharge planning and goals. He is advised that average stay is 3-5 days, important he began to work on his goals. He answers most questions with I dont know. He does continue to ruminate about his chronic suicidal thoughts. He reports passive si at this time, however is able to contract for safety. He was started on ZOlof 50mg  po daily for depression, and course of expectation has been reviewed with patient. He is also made aware that the medication works better in conjunction with therapy when he participates. He has a history of alcohol abuse although on admission he reported he use had significantly dropped. He denies withdrawal symptoms and does not appear to be in a withdrawal state. Pt has a history of PTSD although denies related symptoms.  He endorse no concerns with appetite and poor sleep. He is also on  [place] 156 mgr Q month, and on Naltrexone 380 mgr SUSR Q month, last doses for both of these 2 weeks ago. And he will resume these following discharge as [previosuly prescribed. Patient is a veteran  and connected to the [place] and has hopes to be discharge to a [place] facility although he does understand that he may have t transition form a facility if accepted from home. He does have stable housing. At this time, he is contracting for safety on the unit.   Principal Problem: Severe recurrent major depression without psychotic features (HCC) Diagnosis:   Patient Active Problem List   Diagnosis Date Noted  . Severe recurrent major depression without psychotic features (HCC) [F33.2] 09/30/2017  . 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 Essential hypertension [I10]   . Dyslipidemia [E78.5]   . Chest pain on exertion [R07.9]   . Stable angina (HCC) [I20.8] 06/17/2017   Total Time spent with patient: 20 minutes  Past Psychiatric History: istory of prior psychiatric admissions , most recently 2-3 months ago at [facility] hospital, for depression.  Reports history of prior suicidal attempts by overdosing . Reports past , not recent, history of auditory hallucinations. States that in the past he has been diagnosed with Bipolar Disorder but currently does not endorse any clear history of hypomania/mania.  He has been prescribed Invega Sustenna 156 mgrs monthly and on Naltrexone 380 mgrs SUSR monthly. He last got medications " about mid August ). History of alcohol use disorder   Past Medical History:  Past Medical History:  Diagnosis Date  . Diabetes mellitus without complication (HCC)   . Hypertension   . MI, acute, non ST segment elevation (HCC) 2007   stents  x 2 2007 [facility] [place])  . Stroke [facility]) 2017    Past Surgical History:  Procedure Laterality Date  . CORONARY ARTERY BYPASS GRAFT N/A 06/23/2017   Procedure: CORONARY ARTERY BYPASS GRAFTING (CABG) x 3; Using Left Internal Mammary Artery and Right Leg Greater Saphenous Vein harvested endoscopically.;  Surgeon: Kerin Perna, MD;  Location: [facility] OR;  Service: Open Heart Surgery;  Laterality: N/A;  . LEFT HEART CATH AND CORONARY ANGIOGRAPHY N/A 06/18/2017    Procedure: LEFT HEART CATH AND CORONARY ANGIOGRAPHY;  Surgeon: Lennette Bihari, MD;  Location: [facility] INVASIVE CV LAB;  Service: Cardiovascular;  Laterality: N/A;  . TEE WITHOUT CARDIOVERSION N/A 06/23/2017   Procedure: TRANSESOPHAGEAL ECHOCARDIOGRAM (TEE);  Surgeon: Donata Clay, Theron Arista, MD;  Location: [facility] OR;  Service: Open Heart Surgery;  Laterality: N/A;   Family History:  Family History  Problem Relation Age of Onset  . CAD Mother   . Congestive Heart Failure Father    Family Psychiatric  History: denies history of mental illness in family Social History:  Social History   Substance and Sexual Activity  Alcohol Use Yes   Comment: 1-2  beers a week     Social History   Substance and Sexual Activity  Drug Use Yes  . Types: Marijuana    Social History   Socioeconomic History  . Marital status: Single    Spouse name: Not on file  . Number of children: Not on file  . Years of education: Not on file  . Highest education level: Not on file  Occupational History  . Not on file  Social Needs  . Financial resource strain: Not on file  . Food insecurity:    Worry: Not on file    Inability: Not on file  . Transportation needs:    Medical: Not on file    Non-medical: Not on file  Tobacco Use  . Smoking status: Never Smoker  . Smokeless tobacco: Never Used  Substance and Sexual Activity  . Alcohol use: Yes    Comment: 1-2  beers a week  . Drug use: Yes    Types: Marijuana  . Sexual activity: Not on file  Lifestyle  . Physical activity:    Days per week: Not on file    Minutes per session: Not on file  . Stress: Not on file  Relationships  . Social connections:    Talks on phone: Not on file    Gets together: Not on file    Attends religious service: Not on file    Active member of club or organization: Not on file    Attends meetings of clubs or organizations: Not on file    Relationship status: Not on file  Other Topics Concern  . Not on file  Social History  Narrative  . Not on file   Additional Social History:      Sleep: Poor  Appetite:  Fair  Current Medications: Current Facility-Administered Medications  Medication Dose Route Frequency Provider Last Rate Last Dose  . acetaminophen (TYLENOL) tablet 650 mg  650 mg Oral Q6H PRN Nira Conn A, NP      . alum & mag hydroxide-simeth (MAALOX/MYLANTA) 200-200-20 MG/5ML suspension 30 mL  30 mL Oral Q4H PRN Nira Conn A, NP      . aspirin EC tablet 325 mg  325 mg Oral Daily Nira Conn A, NP   325 mg at 10/01/17 0810  . atorvastatin (LIPITOR) tablet 80 mg  80 mg Oral q1800 Nira Conn  A, NP   80 mg at 10/01/17 1813  . furosemide (LASIX) tablet 40 mg  40 mg Oral Daily Nira Conn A, NP   40 mg at 10/01/17 0810  . insulin aspart (novoLOG) injection 0-15 Units  0-15 Units Subcutaneous TID WC Shon Hale, MD   5 Units at 10/01/17 1814  . insulin aspart (novoLOG) injection 0-5 Units  0-5 Units Subcutaneous QHS Shon Hale, MD   3 Units at 09/30/17 2224  . insulin aspart (novoLOG) injection 3 Units  3 Units Subcutaneous TID WC Shon Hale, MD   3 Units at 10/01/17 1815  . insulin glargine (LANTUS) injection 30 Units  30 Units Subcutaneous QHS Shon Hale, MD   30 Units at 10/01/17 2223  . lisinopril (PRINIVIL,ZESTRIL) tablet 5 mg  5 mg Oral Daily Emokpae, Courage, MD   5 mg at 10/01/17 0810  . magnesium hydroxide (MILK OF MAGNESIA) suspension 30 mL  30 mL Oral Daily PRN Jackelyn Poling, NP      . Melatonin TABS 6 mg  6 mg Oral QHS PRN Nira Conn A, NP      . metFORMIN (GLUCOPHAGE) tablet 1,000 mg  1,000 mg Oral BID WC Nira Conn A, NP   1,000 mg at 10/01/17 1812  . metoprolol tartrate (LOPRESSOR) tablet 50 mg  50 mg Oral BID Nira Conn A, NP   50 mg at 10/01/17 1813  . potassium chloride 20 MEQ/15ML (10%) solution 20 mEq  20 mEq Oral Daily Nira Conn A, NP   20 mEq at 10/01/17 0818  . sertraline (ZOLOFT) tablet 50 mg  50 mg Oral Daily Denzil Magnuson, NP      .  traZODone (DESYREL) tablet 100 mg  100 mg Oral QHS Nira Conn A, NP   100 mg at 10/01/17 2224    Lab Results:  Results for orders placed or performed during the hospital encounter of 09/30/17 (from the past 48 hour(s))  Glucose, capillary     Status: Abnormal   Collection Time: 09/30/17 12:04 PM  Result Value Ref Range   Glucose-Capillary 429 (H) 70 - 99 mg/dL  Glucose, capillary     Status: Abnormal   Collection Time: 09/30/17  5:22 PM  Result Value Ref Range   Glucose-Capillary 383 (H) 70 - 99 mg/dL  Glucose, capillary     Status: Abnormal   Collection Time: 09/30/17  9:59 PM  Result Value Ref Range   Glucose-Capillary 297 (H) 70 - 99 mg/dL   Comment 1 Notify RN   Glucose, capillary     Status: Abnormal   Collection Time: 10/01/17  5:58 AM  Result Value Ref Range   Glucose-Capillary 133 (H) 70 - 99 mg/dL  Hemoglobin Z3Y     Status: Abnormal   Collection Time: 10/01/17  6:20 AM  Result Value Ref Range   Hgb A1c MFr Bld 10.0 (H) 4.8 - 5.6 %    Comment: (NOTE) Pre diabetes:          5.7%-6.4% Diabetes:              >6.4% Glycemic control for   <7.0% adults with diabetes    Mean Plasma Glucose 240.3 mg/dL    Comment: Performed at [facility], [address], [place], [place] [address]  Lipid panel     Status: Abnormal   Collection Time: 10/01/17  6:20 AM  Result Value Ref Range   Cholesterol 209 (H) 0 - 200 mg/dL   Triglycerides 469 <629 mg/dL  HDL 32 (L) >40 mg/dL   Total CHOL/HDL Ratio 6.5 RATIO   VLDL 26 0 - 40 mg/dL   LDL Cholesterol 794 (H) 0 - 99 mg/dL    Comment:        Total Cholesterol/HDL:CHD Risk Coronary Heart Disease Risk Table                     Men   Women  1/2 Average Risk   3.4   3.3  Average Risk       5.0   4.4  2 X Average Risk   9.6   7.1  3 X Average Risk  23.4   11.0        Use the calculated Patient Ratio above and the CHD Risk Table to determine the patient's CHD Risk.        ATP III CLASSIFICATION (LDL):  <100     mg/dL    Optimal  801-655  mg/dL   Near or Above                    Optimal  130-159  mg/dL   Borderline  374-827  mg/dL   High  >078     mg/dL   Very High Performed at [facility], [address] [place], [place] [address]   TSH     Status: None   Collection Time: 10/01/17  6:20 AM  Result Value Ref Range   TSH 2.803 0.350 - 4.500 uIU/mL    Comment: Performed by a 3rd Generation assay with a functional sensitivity of <=0.01 uIU/mL. Performed at [facility], [address] [place], [place] [address]   Glucose, capillary     Status: Abnormal   Collection Time: 10/01/17 12:03 PM  Result Value Ref Range   Glucose-Capillary 236 (H) 70 - 99 mg/dL   Comment 1 Notify RN    Comment 2 Document in Chart   Glucose, capillary     Status: Abnormal   Collection Time: 10/01/17  5:01 PM  Result Value Ref Range   Glucose-Capillary 247 (H) 70 - 99 mg/dL   Comment 1 Notify RN    Comment 2 Document in Chart   Glucose, capillary     Status: Abnormal   Collection Time: 10/01/17  9:08 PM  Result Value Ref Range   Glucose-Capillary 128 (H) 70 - 99 mg/dL   Comment 1 Notify RN    Comment 2 Document in Chart   Glucose, capillary     Status: Abnormal   Collection Time: 10/02/17  5:59 AM  Result Value Ref Range   Glucose-Capillary 100 (H) 70 - 99 mg/dL   Comment 1 Notify RN    Comment 2 Document in Chart     Blood Alcohol level:  Lab Results  Component Value Date   ETH <10 09/29/2017    Metabolic Disorder Labs: Lab Results  Component Value Date   HGBA1C 10.0 (H) 10/01/2017   MPG 240.3 10/01/2017   MPG 165.68 06/17/2017   No results found for: PROLACTIN Lab Results  Component Value Date   CHOL 209 (H) 10/01/2017   TRIG 128 10/01/2017   HDL 32 (L) 10/01/2017   CHOLHDL 6.5 10/01/2017   VLDL 26 10/01/2017   LDLCALC 151 (H) 10/01/2017   LDLCALC 146 (H) 06/19/2017    Physical Findings: AIMS: Facial and Oral Movements Muscles of Facial Expression:  None, normal Lips and Perioral Area: None, normal Jaw: None, normal Tongue: None,  normal,Extremity Movements Upper (arms, wrists, hands, fingers): None, normal Lower (legs, knees, ankles, toes): None, normal, Trunk Movements Neck, shoulders, hips: None, normal, Overall Severity Severity of abnormal movements (highest score from questions above): None, normal Incapacitation due to abnormal movements: None, normal Patient's awareness of abnormal movements (rate only patient's report): No Awareness, Dental Status Current problems with teeth and/or dentures?: Yes Does patient usually wear dentures?: No  CIWA:    COWS:     Musculoskeletal: Strength & Muscle Tone: within normal limits Gait & Station: normal Patient leans: N/A  Psychiatric Specialty Exam: Physical Exam  Nursing note and vitals reviewed. Constitutional: He is oriented to person, place, and time.  Neurological: He is alert and oriented to person, place, and time.    Review of Systems  Psychiatric/Behavioral: Positive for depression, substance abuse and suicidal ideas. Negative for hallucinations and memory loss. The patient has insomnia. The patient is not nervous/anxious.   All other systems reviewed and are negative.   Blood pressure 106/79, pulse 76, temperature 98.5 F (36.9 C), temperature source Oral, resp. rate 16, height 6' (1.829 m), weight 83.9 kg, SpO2 99 %.Body mass index is 25.09 kg/m.  General Appearance: Fairly Groomed  Eye Contact:  Fair  Speech:  Clear and Coherent and Normal Rate  Volume:  Decreased  Mood:  Depressed  Affect:  Depressed  Thought Process:  Coherent, Goal Directed, Linear and Descriptions of Associations: Intact  Orientation:  Full (Time, Place, and Person)  Thought Content:  Logical  Suicidal Thoughts:  Yes.  without intent/plan  Homicidal Thoughts:  No  Memory:  Immediate;   Fair Recent;   Fair  Judgement:  Fair  Insight:  Lacking  Psychomotor Activity:  Normal   Concentration:  Concentration: Fair and Attention Span: Fair  Recall:  [employer] of Knowledge:  Fair  Language:  Good  Akathisia:  Negative  Handed:  Right  AIMS (if indicated):     Assets:  Communication Skills Desire for Improvement Resilience Social Support  ADL's:  Intact  Cognition:  WNL  Sleep:  Number of Hours: 5.75     Treatment Plan Summary: Reviewed current treatment plan 10/02/2017. Will continue the following plan with adjustments where noted.   Daily contact with patient to assess and evaluate symptoms and progress in treatment   Medication management: Psychiatric conditions are unstable at this time. To aconitine to reduce current symptoms to base line and improve the patient's overall level of functioning will increase Zoloft to 50 mg po daily for depression, Trazodone 100 m gpo daily as needed at bedtime. He will resume [place] 156 mgr Q month, and on Naltrexone 380 mgr SUSR Q month 9last doses for both of these 2 weeks ago) with outpatient provider.Will esume home medications for DM and HTN as noted in [facility].  Will continue glucose monitoring which seems to be improving.    Other:  Safety: Will continue 15 minute observation for safety checks. Patient is able to contract for safety on the unit at this time  Continue to develop treatment plan to decrease risk of relapse upon discharge and to reduce the need for readmission.  Psycho-social education regarding relapse prevention and self care.  Health care follow up as needed for medical problems.  Continue to attend and participate in therapy.   Labs: TSH normal, cholesterol 209, HDL 32 and LDL 151, HgbA1c 10.0 he did report on admission he had been non complaint with Diabetes medication. MD have consulted hospitalist service for assistance  with DM/ HTN management  He is on sliding scale as well as Lantus at this time. TSH postive for cocaine and THC on admission. Ethanol negative.   Discharge: On going. CSW  will continue to work on discharge disposition.   Marcus Hayward, FNP 10/02/2017, 11:52 AM   ..Agree with NP Progress Note

## 2017-10-02 NOTE — Plan of Care (Signed)
  Problem: Education: Goal: Emotional status will improve Outcome: Not Progressing   

## 2017-10-02 NOTE — Progress Notes (Signed)
D Pt is observed OOB UAL on the 300 hall today. He tolerates this fairly well. HE endorses a flat depressedmaffect. HE speaks softly and in a monotone.     A He wears hospital -issue red patient scrubs. HE did complete his daily assessment and on this he wrote he denied SI today and he rated his depression, hopelessness and anxeity " 5/5/8", respectively. He does not inititiate conversatin on is own.     R Safety is in place.

## 2017-10-03 LAB — GLUCOSE, CAPILLARY
GLUCOSE-CAPILLARY: 79 mg/dL (ref 70–99)
GLUCOSE-CAPILLARY: 294 mg/dL — AB (ref 70–99)
GLUCOSE-CAPILLARY: 174 mg/dL — AB (ref 70–99)
Glucose-Capillary: 131 mg/dL — ABNORMAL HIGH (ref 70–99)

## 2017-10-03 NOTE — [facility] Group Notes (Signed)
[facility] Group Notes:  (Nursing/MHT/Case Management/Adjunct)  Date:  10/03/2017  Time:  1:15 PM  Type of Therapy:  Nurse Education  Participation Level:  Active  Participation Quality:  Appropriate  Affect:  Appropriate  Cognitive:  Appropriate  Insight:  Appropriate  Engagement in Group:  Engaged  Modes of Intervention:  Discussion and Education  Summary of Progress/Problems:Nurse led group: Life Skills/Identifying Needs   Raylene Miyamoto 10/03/2017, 3:35 PM

## 2017-10-03 NOTE — [facility] Group Notes (Signed)
LCSW Group Therapy Note  10/03/2017    10:00-11:00am   Type of Therapy and Topic:  Group Therapy: Anger and Coping Skills  Participation Level:  Did Not Attend   Description of Group:   In this group, patients learned how to recognize the physical, cognitive, emotional, and behavioral responses they have to anger-provoking situations.  They identified how they usually or often react when angered, and learned how healthy and unhealthy coping skills work initially, but the unhealthy ones stop working.   They analyzed how their frequently-chosen coping skill is possibly beneficial and how it is possibly unhelpful.  The group discussed a variety of healthier coping skills that could help in resolving the actual issues, as well as how to go about planning for the the possibility of future similar situations.  Therapeutic Goals: 1. Patients will identify one thing that makes them angry and how they feel emotionally and physically, what their thoughts are or tend to be in those situations, and what healthy or unhealthy coping mechanism they typically use 2. Patients will identify how their coping technique works for them, as well as how it works against them. 3. Patients will explore possible new behaviors to use in future anger situations. 4. Patients will learn that anger itself is normal and cannot be eliminated, and that healthier coping skills can assist with resolving conflict rather than worsening situations.  Summary of Patient Progress:  N/A  Therapeutic Modalities:   Cognitive Behavioral Therapy Motivation Interviewing  Zuleyma Scharf [name]  .  

## 2017-10-03 NOTE — Progress Notes (Addendum)
[facility] MD Progress Note  10/03/2017 1:57 PM Marcus Bathe Sr.  MRN:  [id number]   Subjective:  " I can change my behaviors by doing something different. I really like that group Im in. I need to stop drinking and doing drugs. I need to find soemthing to do besides sitting around doing nothing. I have a lot of time on my hands being by myself.  "   Evaluation on the unit: Face to face evaluation completed, case discussed with treatment team and chart reviewed. Patient is a 52 year old male who was admitted to the unit following suicidal thoughts.   On evaluation, patient is alert and oriented x4, calm and cooperative. He skipped morning groups, and went to his room after breakfast. Again he is encouraged to participate in his treatment. He reports chronic suicide thoughts, that have improved since admission. He states he used to self medicate to help his suicidal thoughts. He is unable to identify.  He continues to take Zoloft for depression. He is also made aware that the medication works better in conjunction with therapy when he participates. He denies withdrawal symptoms and does not appear to be in a withdrawal state. Pt has a history of PTSD although denies related symptoms.  He endorse no concerns with appetite and poor sleep. He is also on  [place] 156 mgr Q month, and on Naltrexone 380 mgr SUSR Q month, last doses for both of these 2 weeks ago. And he will resume these following discharge as [previosuly prescribed. He does have stable housing. At this time, he is contracting for safety on the unit.   Principal Problem: Severe recurrent major depression without psychotic features (HCC) Diagnosis:   Patient Active Problem List   Diagnosis Date Noted  . Severe recurrent major depression without psychotic features (HCC) [F33.2] 09/30/2017  . 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 Essential hypertension [I10]   . Dyslipidemia [E78.5]   . Chest pain on exertion [R07.9]   . Stable angina  (HCC) [I20.8] 06/17/2017   Total Time spent with patient: 20 minutes  Past Psychiatric History: istory of prior psychiatric admissions , most recently 2-3 months ago at [facility] hospital, for depression.  Reports history of prior suicidal attempts by overdosing . Reports past , not recent, history of auditory hallucinations. States that in the past he has been diagnosed with Bipolar Disorder but currently does not endorse any clear history of hypomania/mania.  He has been prescribed Invega Sustenna 156 mgrs monthly and on Naltrexone 380 mgrs SUSR monthly. He last got medications " about mid August ). History of alcohol use disorder   Past Medical History:  Past Medical History:  Diagnosis Date  . Diabetes mellitus without complication (HCC)   . Hypertension   . MI, acute, non ST segment elevation (HCC) 2007   stents x 2 2007 [facility] [place])  . Stroke [facility]) 2017    Past Surgical History:  Procedure Laterality Date  . CORONARY ARTERY BYPASS GRAFT N/A 06/23/2017   Procedure: CORONARY ARTERY BYPASS GRAFTING (CABG) x 3; Using Left Internal Mammary Artery and Right Leg Greater Saphenous Vein harvested endoscopically.;  Surgeon: Kerin Perna, MD;  Location[facility] OR;  Service: Open Heart Surgery;  Laterality: N/A;  . LEFT HEART CATH AND CORONARY ANGIOGRAPHY N/A 06/18/2017   Procedure: LEFT HEART CATH AND CORONARY ANGIOGRAPHY;  Surgeon: Lennette Bihari, MD;  Location: [facility] INVASIVE CV LAB;  Service: Cardiovascular;  Laterality: N/A;  . TEE WITHOUT CARDIOVERSION N/A  06/23/2017   Procedure: TRANSESOPHAGEAL ECHOCARDIOGRAM (TEE);  Surgeon: Donata Clay, Theron Arista, MD;  Location: [facility] OR;  Service: Open Heart Surgery;  Laterality: N/A;   Family History:  Family History  Problem Relation Age of Onset  . CAD Mother   . Congestive Heart Failure Father    Family Psychiatric  History: denies history of mental illness in family Social History:  Social History   Substance and Sexual Activity  Alcohol Use Yes   Comment:  1-2  beers a week     Social History   Substance and Sexual Activity  Drug Use Yes  . Types: Marijuana    Social History   Socioeconomic History  . Marital status: Single    Spouse name: Not on file  . Number of children: Not on file  . Years of education: Not on file  . Highest education level: Not on file  Occupational History  . Not on file  Social Needs  . Financial resource strain: Not on file  . Food insecurity:    Worry: Not on file    Inability: Not on file  . Transportation needs:    Medical: Not on file    Non-medical: Not on file  Tobacco Use  . Smoking status: Never Smoker  . Smokeless tobacco: Never Used  Substance and Sexual Activity  . Alcohol use: Yes    Comment: 1-2  beers a week  . Drug use: Yes    Types: Marijuana  . Sexual activity: Not on file  Lifestyle  . Physical activity:    Days per week: Not on file    Minutes per session: Not on file  . Stress: Not on file  Relationships  . Social connections:    Talks on phone: Not on file    Gets together: Not on file    Attends religious service: Not on file    Active member of club or organization: Not on file    Attends meetings of clubs or organizations: Not on file    Relationship status: Not on file  Other Topics Concern  . Not on file  Social History Narrative  . Not on file   Additional Social History:      Sleep: Poor  Appetite:  Fair  Current Medications: Current Facility-Administered Medications  Medication Dose Route Frequency Provider Last Rate Last Dose  . acetaminophen (TYLENOL) tablet 650 mg  650 mg Oral Q6H PRN Nira Conn A, NP   650 mg at 10/02/17 2103  . alum & mag hydroxide-simeth (MAALOX/MYLANTA) 200-200-20 MG/5ML suspension 30 mL  30 mL Oral Q4H PRN Nira Conn A, NP      . aspirin EC tablet 325 mg  325 mg Oral Daily Nira Conn A, NP   325 mg at 10/03/17 0837  . atorvastatin (LIPITOR) tablet 80 mg  80 mg Oral q1800 Nira Conn A, NP   80 mg at 10/02/17 1847  .  furosemide (LASIX) tablet 40 mg  40 mg Oral Daily Nira Conn A, NP   40 mg at 10/03/17 0837  . insulin aspart (novoLOG) injection 0-15 Units  0-15 Units Subcutaneous TID WC Emokpae, Courage, MD   15 Units at 10/02/17 1726  . insulin aspart (novoLOG) injection 0-5 Units  0-5 Units Subcutaneous QHS Shon Hale, MD   3 Units at 09/30/17 2224  . insulin aspart (novoLOG) injection 3 Units  3 Units Subcutaneous TID WC Emokpae, Courage, MD   3 Units at 10/02/17 1726  . insulin glargine (LANTUS) injection  30 Units  30 Units Subcutaneous QHS Shon Hale, MD   30 Units at 10/02/17 2104  . lisinopril (PRINIVIL,ZESTRIL) tablet 5 mg  5 mg Oral Daily Emokpae, Courage, MD   5 mg at 10/03/17 0838  . magnesium hydroxide (MILK OF MAGNESIA) suspension 30 mL  30 mL Oral Daily PRN Jackelyn Poling, NP      . Melatonin TABS 6 mg  6 mg Oral QHS PRN Nira Conn A, NP      . metFORMIN (GLUCOPHAGE) tablet 1,000 mg  1,000 mg Oral BID WC Nira Conn A, NP   1,000 mg at 10/03/17 0838  . metoprolol tartrate (LOPRESSOR) tablet 50 mg  50 mg Oral BID Nira Conn A, NP   50 mg at 10/03/17 0838  . potassium chloride SA (K-DUR,KLOR-CON) CR tablet 20 mEq  20 mEq Oral Daily Rebecka Oelkers, Rockey Situ, MD   20 mEq at 10/03/17 0838  . sertraline (ZOLOFT) tablet 50 mg  50 mg Oral Daily Denzil Magnuson, NP   50 mg at 10/03/17 0837  . traZODone (DESYREL) tablet 100 mg  100 mg Oral QHS Nira Conn A, NP   100 mg at 10/02/17 2103    Lab Results:  Results for orders placed or performed during the hospital encounter of 09/30/17 (from the past 48 hour(s))  Glucose, capillary     Status: Abnormal   Collection Time: 10/01/17  5:01 PM  Result Value Ref Range   Glucose-Capillary 247 (H) 70 - 99 mg/dL   Comment 1 Notify RN    Comment 2 Document in Chart   Glucose, capillary     Status: Abnormal   Collection Time: 10/01/17  9:08 PM  Result Value Ref Range   Glucose-Capillary 128 (H) 70 - 99 mg/dL   Comment 1 Notify RN    Comment 2  Document in Chart   Glucose, capillary     Status: Abnormal   Collection Time: 10/02/17  5:59 AM  Result Value Ref Range   Glucose-Capillary 100 (H) 70 - 99 mg/dL   Comment 1 Notify RN    Comment 2 Document in Chart   Glucose, capillary     Status: Abnormal   Collection Time: 10/02/17 11:59 AM  Result Value Ref Range   Glucose-Capillary 280 (H) 70 - 99 mg/dL  Glucose, capillary     Status: Abnormal   Collection Time: 10/02/17  5:15 PM  Result Value Ref Range   Glucose-Capillary 372 (H) 70 - 99 mg/dL  Glucose, capillary     Status: Abnormal   Collection Time: 10/02/17  8:56 PM  Result Value Ref Range   Glucose-Capillary 141 (H) 70 - 99 mg/dL   Comment 1 Notify RN    Comment 2 Document in Chart   Glucose, capillary     Status: None   Collection Time: 10/03/17  6:06 AM  Result Value Ref Range   Glucose-Capillary 79 70 - 99 mg/dL   Comment 1 Notify RN    Comment 2 Document in Chart   Glucose, capillary     Status: Abnormal   Collection Time: 10/03/17 11:56 AM  Result Value Ref Range   Glucose-Capillary 131 (H) 70 - 99 mg/dL   Comment 1 Notify RN    Comment 2 Document in Chart     Blood Alcohol level:  Lab Results  Component Value Date   ETH <10 09/29/2017    Metabolic Disorder Labs: Lab Results  Component Value Date   HGBA1C 10.0 (H) 10/01/2017   MPG  240.3 10/01/2017   MPG 165.68 06/17/2017   No results found for: PROLACTIN Lab Results  Component Value Date   CHOL 209 (H) 10/01/2017   TRIG 128 10/01/2017   HDL 32 (L) 10/01/2017   CHOLHDL 6.5 10/01/2017   VLDL 26 10/01/2017   LDLCALC 151 (H) 10/01/2017   LDLCALC 146 (H) 06/19/2017    Physical Findings: AIMS: Facial and Oral Movements Muscles of Facial Expression: None, normal Lips and Perioral Area: None, normal Jaw: None, normal Tongue: None, normal,Extremity Movements Upper (arms, wrists, hands, fingers): None, normal Lower (legs, knees, ankles, toes): None, normal, Trunk Movements Neck, shoulders,  hips: None, normal, Overall Severity Severity of abnormal movements (highest score from questions above): None, normal Incapacitation due to abnormal movements: None, normal Patient's awareness of abnormal movements (rate only patient's report): No Awareness, Dental Status Current problems with teeth and/or dentures?: Yes Does patient usually wear dentures?: No  CIWA:    COWS:     Musculoskeletal: Strength & Muscle Tone: within normal limits Gait & Station: normal Patient leans: N/A  Psychiatric Specialty Exam: Physical Exam  Nursing note and vitals reviewed. Constitutional: He is oriented to person, place, and time.  Neurological: He is alert and oriented to person, place, and time.    Review of Systems  Psychiatric/Behavioral: Positive for depression, substance abuse and suicidal ideas. Negative for hallucinations and memory loss. The patient has insomnia. The patient is not nervous/anxious.   All other systems reviewed and are negative.   Blood pressure 113/71, pulse 75, temperature 98.3 F (36.8 C), temperature source Oral, resp. rate 16, height 6' (1.829 m), weight 83.9 kg, SpO2 99 %.Body mass index is 25.09 kg/m.  General Appearance: Fairly Groomed  Eye Contact:  Fair  Speech:  Clear and Coherent and Normal Rate  Volume:  Decreased  Mood:  Depressed  Affect:  Depressed  Thought Process:  Coherent, Goal Directed, Linear and Descriptions of Associations: Intact  Orientation:  Full (Time, Place, and Person)  Thought Content:  Logical  Suicidal Thoughts:  No  Homicidal Thoughts:  No  Memory:  Immediate;   Fair Recent;   Fair  Judgement:  Fair  Insight:  Lacking  Psychomotor Activity:  Normal  Concentration:  Concentration: Fair and Attention Span: Fair  Recall:  [employer] of Knowledge:  Fair  Language:  Good  Akathisia:  Negative  Handed:  Right  AIMS (if indicated):     Assets:  Communication Skills Desire for Improvement Resilience Social Support  ADL's:   Intact  Cognition:  WNL  Sleep:  Number of Hours: 6.5     Treatment Plan Summary: Reviewed current treatment plan 10/03/2017. Will continue the following plan with adjustments where noted.   Daily contact with patient to assess and evaluate symptoms and progress in treatment   Medication management: Psychiatric conditions are unstable at this time. To aconitine to reduce current symptoms to base line and improve the patient's overall level of functioning will increase Zoloft to 50 mg po daily for depression, Trazodone 100 m gpo daily as needed at bedtime. He will resume [place] 156 mgr Q month, and on Naltrexone 380 mgr SUSR Q month 9last doses for both of these 2 weeks ago) with outpatient provider.Will esume home medications for DM and HTN as noted in [facility].  Will continue glucose monitoring which seems to be improving.    Other:  Safety: Will continue 15 minute observation for safety checks. Patient is able to contract for safety on the unit  at this time  Continue to develop treatment plan to decrease risk of relapse upon discharge and to reduce the need for readmission.  Psycho-social education regarding relapse prevention and self care.  Health care follow up as needed for medical problems.  Continue to attend and participate in therapy.   Labs: TSH normal, cholesterol 209, HDL 32 and LDL 151, HgbA1c 10.0 he did report on admission he had been non complaint with Diabetes medication. MD have consulted hospitalist service for assistance with DM/ HTN management  He is on sliding scale as well as Lantus at this time. TSH postive for cocaine and THC on admission. Ethanol negative.   Discharge: On going. CSW will continue to work on discharge disposition.   Truman Hayward, FNP 10/03/2017, 1:57 PM    .Agree with NP Progress Note

## 2017-10-03 NOTE — Progress Notes (Signed)
D patient is observed OOB UAL on the 300 hall today..he remains guarded, quiet and keeps to himself. He endorses a flat, depressed affect, avoids making eye contact. He moves very slowly, wears hospital-issue scrubs and is observed dozing off during group today.     A HE completes his daily assessment and on this he wrote he has experienced SI today and he rated his depression, hopelessness and anxiety " 11/05/08", respectively.     R Safety is in place.

## 2017-10-03 NOTE — Plan of Care (Signed)
  Problem: Self-Concept: Goal: Will verbalize positive feelings about self Outcome: Not Progressing   

## 2017-10-04 LAB — GLUCOSE, CAPILLARY
GLUCOSE-CAPILLARY: 125 mg/dL — AB (ref 70–99)
GLUCOSE-CAPILLARY: 147 mg/dL — AB (ref 70–99)
Glucose-Capillary: 101 mg/dL — ABNORMAL HIGH (ref 70–99)
Glucose-Capillary: 126 mg/dL — ABNORMAL HIGH (ref 70–99)

## 2017-10-04 MED ORDER — SERTRALINE HCL 50 MG PO TABS
75.0000 mg | ORAL_TABLET | Freq: Every day | ORAL | Status: DC
  Administered 2017-10-05 – 2017-10-13 (×9): 75 mg via ORAL
  Filled 2017-10-04 (×11): qty 1

## 2017-10-04 MED ORDER — ARIPIPRAZOLE 2 MG PO TABS
2.0000 mg | ORAL_TABLET | Freq: Every day | ORAL | Status: DC
  Administered 2017-10-04 – 2017-10-06 (×3): 2 mg via ORAL
  Filled 2017-10-04 (×5): qty 1

## 2017-10-04 NOTE — Progress Notes (Signed)
Patient ID: Marcus DRECHSLER Sr., male   DOB: 04/18/1965, 52 y.o.   MRN: [id number] D: Pt reports that his sleep quality last night was fair, he reprots a low energy level, a poor concentration level, rates his depression as 10(10 being the highest), rates his hopelessness level as 10 (10 being the highest), and rates his anxiety level as 10 (10 being the highest).  Pt endorses +SI, denies having a plan, and verbally contracts for safety on the unit.  Pt also denies HI/AVH, reports feeling better than prior to admission, and states that what is important for him today is to attend groups.    A: Pt being given all meds as scheduled, and is being maintained on Q15 minute checks for safety.  R: Will continue to monitor on Q15 minute checks for safety.

## 2017-10-04 NOTE — Progress Notes (Signed)
D: Pt was in bed in his room upon initial approach.  Pt presents with depressed affect and mood.  Describes his day as "all right" and reports goal is to "make it to the meeting and be safe."  Pt did not attend evening group despite encouragement from staff.  Pt denies HI, denies hallucinations, reports bilateral leg pain of 5/10.  Pt endorses SI "always" without plan.  He verbally contracts for safety.    A: Introduced self to pt.  Actively listened to pt and offered support and encouragement. Medications administered per order.  PRN medication administered for pain.  Q15 minute safety checks maintained.  R: Pt is safe on the unit.  Pt is compliant with medications.  Pt verbally contracts for safety.  Will continue to monitor and assess.

## 2017-10-04 NOTE — Progress Notes (Signed)
[facility] MD Progress Note  10/04/2017 10:56 AM Marcus Bathe Sr.  MRN:  [id number]   Subjective:  Reports there has been some improvement compared to admission, but describes ongoing depression. Attributes depression in part to poor local support system and loneliness, and states he has been considering moving to [place] , [place], where he has some family. Describes intermittent passive SI, but denies any suicidal or self injurious plan or intention. Denies medication side effects , feels they have helped " a little "  Evaluation on the unit: Face to face evaluation completed, case discussed with treatment team and chart reviewed. Patient is a 52 year old male who was admitted to the unit due to worsening ( chronic) depression, suicidal ideations. At this time patient reports partially improved mood but still feels depressed, sad, and ruminative about poor local support network. States " I have a place to live, but I feel very lonely". Denies current suicidal plan or intention, does endorse intermittent pasive SI, but  contracts for safety on unit, and is future oriented- as above, reports potential plan of relocating in order to be closer to family members. Denies medication side effects. No disruptive or agitated behaviors on unit. DM better controlled- last CBGs 174, 125.    Principal Problem: Severe recurrent major depression without psychotic features (HCC) Diagnosis:   Patient Active Problem List   Diagnosis Date Noted  . Severe recurrent major depression without psychotic features (HCC) [F33.2] 09/30/2017  . 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 Essential hypertension [I10]   . Dyslipidemia [E78.5]   . Chest pain on exertion [R07.9]   . Stable angina (HCC) [I20.8] 06/17/2017   Total Time spent with patient: 20 minutes  Past Psychiatric History:    Past Medical History:  Past Medical History:  Diagnosis Date  . Diabetes mellitus without complication (HCC)   . Hypertension   .  MI, acute, non ST segment elevation (HCC) 2007   stents x 2 2007 [facility] [place])  . Stroke [facility]) 2017    Past Surgical History:  Procedure Laterality Date  . CORONARY ARTERY BYPASS GRAFT N/A 06/23/2017   Procedure: CORONARY ARTERY BYPASS GRAFTING (CABG) x 3; Using Left Internal Mammary Artery and Right Leg Greater Saphenous Vein harvested endoscopically.;  Surgeon: Kerin Perna, MD;  Location: [facility] OR;  Service: Open Heart Surgery;  Laterality: N/A;  . LEFT HEART CATH AND CORONARY ANGIOGRAPHY N/A 06/18/2017   Procedure: LEFT HEART CATH AND CORONARY ANGIOGRAPHY;  Surgeon: Lennette Bihari, MD;  Location: [facility] INVASIVE CV LAB;  Service: Cardiovascular;  Laterality: N/A;  . TEE WITHOUT CARDIOVERSION N/A 06/23/2017   Procedure: TRANSESOPHAGEAL ECHOCARDIOGRAM (TEE);  Surgeon: Donata Clay, Theron Arista, MD;  Location: [facility] OR;  Service: Open Heart Surgery;  Laterality: N/A;   Family History:  Family History  Problem Relation Age of Onset  . CAD Mother   . Congestive Heart Failure Father    Family Psychiatric  History:  Social History:  Social History   Substance and Sexual Activity  Alcohol Use Yes   Comment: 1-2  beers a week     Social History   Substance and Sexual Activity  Drug Use Yes  . Types: Marijuana    Social History   Socioeconomic History  . Marital status: Single    Spouse name: Not on file  . Number of children: Not on file  . Years of education: Not on file  . Highest education level: Not on file  Occupational History  .  Not on file  Social Needs  . Financial resource strain: Not on file  . Food insecurity:    Worry: Not on file    Inability: Not on file  . Transportation needs:    Medical: Not on file    Non-medical: Not on file  Tobacco Use  . Smoking status: Never Smoker  . Smokeless tobacco: Never Used  Substance and Sexual Activity  . Alcohol use: Yes    Comment: 1-2  beers a week  . Drug use: Yes    Types: Marijuana  . Sexual activity: Not on file  Lifestyle   . Physical activity:    Days per week: Not on file    Minutes per session: Not on file  . Stress: Not on file  Relationships  . Social connections:    Talks on phone: Not on file    Gets together: Not on file    Attends religious service: Not on file    Active member of club or organization: Not on file    Attends meetings of clubs or organizations: Not on file    Relationship status: Not on file  Other Topics Concern  . Not on file  Social History Narrative  . Not on file   Additional Social History:      Sleep: improving   Appetite:  fair   Current Medications: Current Facility-Administered Medications  Medication Dose Route Frequency Provider Last Rate Last Dose  . acetaminophen (TYLENOL) tablet 650 mg  650 mg Oral Q6H PRN Nira Conn A, NP   650 mg at 10/03/17 2112  . alum & mag hydroxide-simeth (MAALOX/MYLANTA) 200-200-20 MG/5ML suspension 30 mL  30 mL Oral Q4H PRN Nira Conn A, NP      . ARIPiprazole (ABILIFY) tablet 2 mg  2 mg Oral Daily Oluwatimileyin Vivier A, MD      . aspirin EC tablet 325 mg  325 mg Oral Daily Nira Conn A, NP   325 mg at 10/04/17 0805  . atorvastatin (LIPITOR) tablet 80 mg  80 mg Oral q1800 Nira Conn A, NP   80 mg at 10/03/17 1808  . furosemide (LASIX) tablet 40 mg  40 mg Oral Daily Nira Conn A, NP   40 mg at 10/04/17 0805  . insulin aspart (novoLOG) injection 0-15 Units  0-15 Units Subcutaneous TID WC Shon Hale, MD   2 Units at 10/04/17 0622  . insulin aspart (novoLOG) injection 0-5 Units  0-5 Units Subcutaneous QHS Shon Hale, MD   3 Units at 09/30/17 2224  . insulin aspart (novoLOG) injection 3 Units  3 Units Subcutaneous TID WC Shon Hale, MD   3 Units at 10/04/17 0622  . insulin glargine (LANTUS) injection 30 Units  30 Units Subcutaneous QHS Shon Hale, MD   30 Units at 10/03/17 2112  . lisinopril (PRINIVIL,ZESTRIL) tablet 5 mg  5 mg Oral Daily Emokpae, Courage, MD   5 mg at 10/04/17 0805  . magnesium hydroxide  (MILK OF MAGNESIA) suspension 30 mL  30 mL Oral Daily PRN Jackelyn Poling, NP      . Melatonin TABS 6 mg  6 mg Oral QHS PRN Nira Conn A, NP      . metFORMIN (GLUCOPHAGE) tablet 1,000 mg  1,000 mg Oral BID WC Nira Conn A, NP   1,000 mg at 10/04/17 0805  . metoprolol tartrate (LOPRESSOR) tablet 50 mg  50 mg Oral BID Nira Conn A, NP   50 mg at 10/04/17 0807  . potassium chloride  SA (K-DUR,KLOR-CON) CR tablet 20 mEq  20 mEq Oral Daily Mrk Buzby, Rockey Situ, MD   20 mEq at 10/04/17 0805  . [START ON 10/05/2017] sertraline (ZOLOFT) tablet 75 mg  75 mg Oral Daily Bunnie Rehberg, Rockey Situ, MD      . traZODone (DESYREL) tablet 100 mg  100 mg Oral QHS Nira Conn A, NP   100 mg at 10/03/17 2112    Lab Results:  Results for orders placed or performed during the hospital encounter of 09/30/17 (from the past 48 hour(s))  Glucose, capillary     Status: Abnormal   Collection Time: 10/02/17 11:59 AM  Result Value Ref Range   Glucose-Capillary 280 (H) 70 - 99 mg/dL  Glucose, capillary     Status: Abnormal   Collection Time: 10/02/17  5:15 PM  Result Value Ref Range   Glucose-Capillary 372 (H) 70 - 99 mg/dL  Glucose, capillary     Status: Abnormal   Collection Time: 10/02/17  8:56 PM  Result Value Ref Range   Glucose-Capillary 141 (H) 70 - 99 mg/dL   Comment 1 Notify RN    Comment 2 Document in Chart   Glucose, capillary     Status: None   Collection Time: 10/03/17  6:06 AM  Result Value Ref Range   Glucose-Capillary 79 70 - 99 mg/dL   Comment 1 Notify RN    Comment 2 Document in Chart   Glucose, capillary     Status: Abnormal   Collection Time: 10/03/17 11:56 AM  Result Value Ref Range   Glucose-Capillary 131 (H) 70 - 99 mg/dL   Comment 1 Notify RN    Comment 2 Document in Chart   Glucose, capillary     Status: Abnormal   Collection Time: 10/03/17  5:08 PM  Result Value Ref Range   Glucose-Capillary 294 (H) 70 - 99 mg/dL   Comment 1 Notify RN    Comment 2 Document in Chart   Glucose, capillary      Status: Abnormal   Collection Time: 10/03/17  8:52 PM  Result Value Ref Range   Glucose-Capillary 174 (H) 70 - 99 mg/dL   Comment 1 Notify RN    Comment 2 Document in Chart   Glucose, capillary     Status: Abnormal   Collection Time: 10/04/17  6:00 AM  Result Value Ref Range   Glucose-Capillary 125 (H) 70 - 99 mg/dL   Comment 1 Notify RN    Comment 2 Document in Chart     Blood Alcohol level:  Lab Results  Component Value Date   ETH <10 09/29/2017    Metabolic Disorder Labs: Lab Results  Component Value Date   HGBA1C 10.0 (H) 10/01/2017   MPG 240.3 10/01/2017   MPG 165.68 06/17/2017   No results found for: PROLACTIN Lab Results  Component Value Date   CHOL 209 (H) 10/01/2017   TRIG 128 10/01/2017   HDL 32 (L) 10/01/2017   CHOLHDL 6.5 10/01/2017   VLDL 26 10/01/2017   LDLCALC 151 (H) 10/01/2017   LDLCALC 146 (H) 06/19/2017    Physical Findings: AIMS: Facial and Oral Movements Muscles of Facial Expression: None, normal Lips and Perioral Area: None, normal Jaw: None, normal Tongue: None, normal,Extremity Movements Upper (arms, wrists, hands, fingers): None, normal Lower (legs, knees, ankles, toes): None, normal, Trunk Movements Neck, shoulders, hips: None, normal, Overall Severity Severity of abnormal movements (highest score from questions above): None, normal Incapacitation due to abnormal movements: None, normal Patient's awareness of abnormal movements (  rate only patient's report): No Awareness, Dental Status Current problems with teeth and/or dentures?: No Does patient usually wear dentures?: No  CIWA:    COWS:     Musculoskeletal: Strength & Muscle Tone: within normal limits Gait & Station: normal Patient leans: N/A  Psychiatric Specialty Exam: Physical Exam  Nursing note and vitals reviewed. Constitutional: He is oriented to person, place, and time.  Neurological: He is alert and oriented to person, place, and time.    Review of Systems   Psychiatric/Behavioral: Positive for depression, substance abuse and suicidal ideas. Negative for hallucinations and memory loss. The patient has insomnia. The patient is not nervous/anxious.   All other systems reviewed and are negative. no chest pain, no shortness of breath, no vomiting  Blood pressure 115/76, pulse 75, temperature 98.6 F (37 C), temperature source Oral, resp. rate 16, height 6' (1.829 m), weight 83.9 kg, SpO2 99 %.Body mass index is 25.09 kg/m.  General Appearance: Fairly Groomed  Eye Contact:  Fair- improves somewhat during session  Speech:  Normal Rate  Volume:  Decreased  Mood:  remains depressed, describes limited improvement since admission  Affect:  Constricted  Thought Process:  Linear and Descriptions of Associations: Intact  Orientation:  Other:  fully alert and attentive   Thought Content:  no hallucinations, no delusions   Suicidal Thoughts:  No- denies suicidal or self injurious ideations, denies homicidal or violent ideations  Homicidal Thoughts:  No  Memory:  recent and remote grossly intact   Judgement:  Other:  improving   Insight:  Fair  Psychomotor Activity:  Decreased  Concentration:  Concentration: Good and Attention Span: Good  Recall:  Good  Fund of Knowledge:  Good  Language:  Good  Akathisia:  Negative  Handed:  Right  AIMS (if indicated):     Assets:  Communication Skills Desire for Improvement Resilience Social Support  ADL's:  Intact  Cognition:  WNL  Sleep:  Number of Hours: 6.75    Assessment - 52 year old male, admitted for depression, suicidal ideations. Reports some improvement since admission but characterizes it as modest /limited . He is endorsing intermittent passive SI but no suicidal plan or intention and is future oriented, with a potential plan of relocating to [place], [place], where he has some family, as he states loneliness and limited local support are major stressors. Tolerating medications well thus far  . Treatment Plan Summary:  Daily contact with patient to assess and evaluate symptoms and progress in treatment  Treatment Plan reviewed as below 9/8  Increase Zoloft to 75 mgrs QDAY for depression Start Abilify 2 mgrs QDAY for antidepressant augmentation Continue Trazodone 100 mgrs QHS PRN for insomnia as needed  Continue Metalonin 6 mgr QHS for insomnia Continue DM management Treatment team working on disposition planning options  Craige Cotta, MD 10/04/2017, 10:56 AM    Patient ID: Marcus Bathe Sr., male   DOB: 1965/10/14, 52 y.o.   MRN: [id number]

## 2017-10-04 NOTE — [facility] Group Notes (Signed)
[facility] LCSW Group Therapy Note  10/04/2017  10:00-11:00AM  Type of Therapy and Topic:  Group Therapy:  Being Your Own Support  Participation Level:  Did Not Attend   Description of Group:  Patients in this group were introduced to the concept that self-support is an essential part of recovery.  A song entitled "My Own Hero" was played and a group discussion ensued in which patients stated they could relate to the song and it inspired them to realize they have be willing to help themselves in order to succeed, because other people cannot achieve sobriety or stability for them.  We discussed adding a variety of healthy supports to address the various needs in their lives.  A song was played called "I Know Where I've Been" toward the end of group and used to conduct an inspirational wrap-up to group of remembering how far they have already come in their journey.  Therapeutic Goals: 1)  demonstrate the importance of being a part of one's own support system 2)  discuss reasons people in one's life may eventually be unable to be continually supportive  3)  identify the patient's current support system and   4)  elicit commitments to add healthy supports and to become more conscious of being self-supportive   Summary of Patient Progress:  N/A   Therapeutic Modalities:   Motivational Interviewing Activity  Amanii Snethen [name]       

## 2017-10-05 DIAGNOSIS — G4700 Insomnia, unspecified: Secondary | ICD-10-CM

## 2017-10-05 DIAGNOSIS — R45851 Suicidal ideations: Secondary | ICD-10-CM

## 2017-10-05 LAB — GLUCOSE, CAPILLARY
GLUCOSE-CAPILLARY: 79 mg/dL (ref 70–99)
GLUCOSE-CAPILLARY: 100 mg/dL — AB (ref 70–99)
Glucose-Capillary: 218 mg/dL — ABNORMAL HIGH (ref 70–99)
Glucose-Capillary: 154 mg/dL — ABNORMAL HIGH (ref 70–99)

## 2017-10-05 MED ORDER — TRAZODONE HCL 150 MG PO TABS
150.0000 mg | ORAL_TABLET | Freq: Every day | ORAL | Status: DC
  Administered 2017-10-05 – 2017-10-13 (×9): 150 mg via ORAL
  Filled 2017-10-05 (×3): qty 1
  Filled 2017-10-05: qty 14
  Filled 2017-10-05 (×2): qty 1
  Filled 2017-10-05: qty 14
  Filled 2017-10-05 (×5): qty 1

## 2017-10-05 NOTE — Tx Team (Signed)
Interdisciplinary Treatment and Diagnostic Plan Update  10/05/2017 Time of Session: 9935TS JENARD WILLIMAS Sr. MRN: [id number]  Principal Diagnosis: MDD, recurrent severe without psychosis   Secondary Diagnoses: Principal Problem:   Severe recurrent major depression without psychotic features (HCC)   Current Medications:  Current Facility-Administered Medications  Medication Dose Route Frequency Provider Last Rate Last Dose  . acetaminophen (TYLENOL) tablet 650 mg  650 mg Oral Q6H PRN Nira Conn A, NP   650 mg at 10/04/17 2117  . alum & mag hydroxide-simeth (MAALOX/MYLANTA) 200-200-20 MG/5ML suspension 30 mL  30 mL Oral Q4H PRN Nira Conn A, NP      . ARIPiprazole (ABILIFY) tablet 2 mg  2 mg Oral Daily Cobos, Rockey Situ, MD   2 mg at 10/05/17 0803  . aspirin EC tablet 325 mg  325 mg Oral Daily Nira Conn A, NP   325 mg at 10/05/17 0805  . atorvastatin (LIPITOR) tablet 80 mg  80 mg Oral q1800 Nira Conn A, NP   80 mg at 10/04/17 1728  . furosemide (LASIX) tablet 40 mg  40 mg Oral Daily Nira Conn A, NP   40 mg at 10/05/17 0803  . insulin aspart (novoLOG) injection 0-15 Units  0-15 Units Subcutaneous TID WC Shon Hale, MD   2 Units at 10/04/17 1724  . insulin aspart (novoLOG) injection 0-5 Units  0-5 Units Subcutaneous QHS Shon Hale, MD   3 Units at 09/30/17 2224  . insulin aspart (novoLOG) injection 3 Units  3 Units Subcutaneous TID WC Shon Hale, MD   3 Units at 10/05/17 0620  . insulin glargine (LANTUS) injection 30 Units  30 Units Subcutaneous QHS Shon Hale, MD   30 Units at 10/04/17 2115  . lisinopril (PRINIVIL,ZESTRIL) tablet 5 mg  5 mg Oral Daily Emokpae, Courage, MD   5 mg at 10/05/17 0805  . magnesium hydroxide (MILK OF MAGNESIA) suspension 30 mL  30 mL Oral Daily PRN Jackelyn Poling, NP      . Melatonin TABS 6 mg  6 mg Oral QHS PRN Nira Conn A, NP      . metFORMIN (GLUCOPHAGE) tablet 1,000 mg  1,000 mg Oral BID WC Nira Conn A, NP   1,000 mg  at 10/05/17 0804  . metoprolol tartrate (LOPRESSOR) tablet 50 mg  50 mg Oral BID Nira Conn A, NP   50 mg at 10/05/17 0805  . potassium chloride SA (K-DUR,KLOR-CON) CR tablet 20 mEq  20 mEq Oral Daily Cobos, Rockey Situ, MD   20 mEq at 10/05/17 0805  . sertraline (ZOLOFT) tablet 75 mg  75 mg Oral Daily Cobos, Rockey Situ, MD   75 mg at 10/05/17 0803  . traZODone (DESYREL) tablet 100 mg  100 mg Oral QHS Nira Conn A, NP   100 mg at 10/04/17 2117   PTA Medications: Medications Prior to Admission  Medication Sig Dispense Refill Last Dose  . aspirin EC 325 MG EC tablet Take 1 tablet (325 mg total) by mouth daily. 30 tablet 0 09/29/2017 at 0900  . atorvastatin (LIPITOR) 80 MG tablet Take 1 tablet (80 mg total) by mouth daily at 6 PM. 30 tablet 1 Past Week at Unknown time  . furosemide (LASIX) 40 MG tablet Take 1 tablet (40 mg total) by mouth daily. 7 tablet 0 09/29/2017 at Unknown time  . glipiZIDE (GLUCOTROL) 5 MG tablet Take 5 mg by mouth daily before breakfast.   09/29/2017 at Unknown time  . insulin glargine (LANTUS) 100 UNIT/ML injection  Inject 30 Units into the skin at bedtime.   09/27/2017  . Melatonin 3 MG CAPS Take 6 mg by mouth at bedtime as needed (for sleep).   09/27/2017  . metFORMIN (GLUCOPHAGE) 1000 MG tablet Take 1,000 mg by mouth 2 (two) times daily with a meal.   09/28/2017 at Unknown time  . metoprolol tartrate (LOPRESSOR) 50 MG tablet Take 1 tablet (50 mg total) by mouth 2 (two) times daily. 60 tablet 1 09/29/2017 at 0900  . Naltrexone 380 MG SUSR Inject 380 mg into the muscle every 28 (twenty-eight) days.   09/10/2017  . paliperidone (INVEGA SUSTENNA) 156 MG/ML SUSY injection Inject 156 mg into the muscle every 28 (twenty-eight) days.   09/10/2017  . potassium chloride 20 MEQ TBCR Take 20 mEq by mouth daily. 7 tablet 0 09/27/2017  . traZODone (DESYREL) 100 MG tablet Take 100 mg by mouth at bedtime.   09/28/2017 at Unknown time    Patient Stressors: Health problems Substance abuse  Patient  Strengths: Capable of independent living Motivation for treatment/growth Supportive family/friends  Treatment Modalities: Medication Management, Group therapy, Case management,  1 to 1 session with clinician, Psychoeducation, Recreational therapy.   Physician Treatment Plan for Primary Diagnosis: MDD, recurrent severe without psychosis   Medication Management: Evaluate patient's response, side effects, and tolerance of medication regimen.  Therapeutic Interventions: 1 to 1 sessions, Unit Group sessions and Medication administration.  Evaluation of Outcomes: Progressing  Physician Treatment Plan for Secondary Diagnosis: Principal Problem:   Severe recurrent major depression without psychotic features (HCC)   Medication Management: Evaluate patient's response, side effects, and tolerance of medication regimen.  Therapeutic Interventions: 1 to 1 sessions, Unit Group sessions and Medication administration.  Evaluation of Outcomes: Progressing   RN Treatment Plan for Primary Diagnosis: MDD, recurrent severe without psychosis  Long Term Goal(s): Knowledge of disease and therapeutic regimen to maintain health will improve  Short Term Goals: Ability to remain free from injury will improve, Ability to verbalize frustration and anger appropriately will improve, Ability to disclose and discuss suicidal ideas and Ability to identify and develop effective coping behaviors will improve  Medication Management: RN will administer medications as ordered by provider, will assess and evaluate patient's response and provide education to patient for prescribed medication. RN will report any adverse and/or side effects to prescribing provider.  Therapeutic Interventions: 1 on 1 counseling sessions, Psychoeducation, Medication administration, Evaluate responses to treatment, Monitor vital signs and CBGs as ordered, Perform/monitor CIWA, COWS, AIMS and Fall Risk screenings as ordered, Perform wound care  treatments as ordered.  Evaluation of Outcomes: Progressing   LCSW Treatment Plan for Primary Diagnosis: MDD, recurrent severe without psychosis  Long Term Goal(s): Safe transition to appropriate next level of care at discharge, Engage patient in therapeutic group addressing interpersonal concerns.  Short Term Goals: Engage patient in aftercare planning with referrals and resources, Facilitate patient progression through stages of change regarding substance use diagnoses and concerns and Identify triggers associated with mental health/substance abuse issues  Therapeutic Interventions: Assess for all discharge needs, 1 to 1 time with Social worker, Explore available resources and support systems, Assess for adequacy in community support network, Educate family and significant other(s) on suicide prevention, Complete Psychosocial Assessment, Interpersonal group therapy.  Evaluation of Outcomes: Progressing   Progress in Treatment: Attending groups: Yes  Participating in groups: Yes  Taking medication as prescribed: Yes. Toleration medication: Yes. Family/Significant other contact made: Yes, with the patient's sister.   Patient understands diagnosis: Yes. Pt minimizing  substance use. Pt denies recent drug or alcohol abuse--UDS pos for cocaine.  Discussing patient identified problems/goals with staff: Yes. Medical problems stabilized or resolved: Yes. Denies suicidal/homicidal ideation: Yes. Issues/concerns per patient self-inventory: No. Other: n/a   New problem(s) identified: No, Describe:  n/a  New Short Term/Long Term Goal(s): detox, medication management for mood stabilization; elimination of SI thoughts; development of comprehensive mental wellness/sobriety plan.   Patient Goals:  "To get help for my depression and lack of energy."   Discharge Plan or Barriers: CSW assessing for appropriate referrals. [facility] pamphlet, Mobile Crisis information, and AA/NA information provided to  patient for additional community support and resources.   Reason for Continuation of Hospitalization: Anxiety Depression Medication stabilization Withdrawal symptoms  Suicidal Ideations  Estimated Length of Stay: Wednesday, 10/07/17  Attendees: Patient: 10/05/2017 8:42 AM  Physician: Dr. Jama Flavors MD; Dr. Altamese [place] MD 10/05/2017 8:42 AM  Nursing: Arlyss Repress RN; Meriam Sprague RN; Arlyss Repress, RN 10/05/2017 8:42 AM  RN Care Manager:X 10/05/2017 8:42 AM  Social Worker: Corrie Mckusick LCSW; Baldo Daub, Theresia Majors 10/05/2017 8:42 AM  Recreational Therapist: x 10/05/2017 8:42 AM  Other: Armandina Stammer NP; Denzil Magnuson NP 10/05/2017 8:42 AM  Other:  10/05/2017 8:42 AM  Other: 10/05/2017 8:42 AM    Scribe for Treatment Team: Maeola Sarah, LCSWA 10/05/2017 8:42 AM

## 2017-10-05 NOTE — Plan of Care (Signed)
  Problem: Education: Goal: Emotional status will improve 10/05/2017 1737 by Dewayne Shorter, RN Outcome: Not Progressing 10/05/2017 1256 by Dewayne Shorter, RN Outcome: Progressing   Problem: Education: Goal: Mental status will improve 10/05/2017 1737 by Dewayne Shorter, RN Outcome: Not Progressing 10/05/2017 1256 by Dewayne Shorter, RN Outcome: Progressing   Problem: Education: Goal: Verbalization of understanding the information provided will improve 10/05/2017 1737 by Dewayne Shorter, RN Outcome: Not Progressing 10/05/2017 1256 by Dewayne Shorter, RN Outcome: Progressing

## 2017-10-05 NOTE — Progress Notes (Signed)
Recreation Therapy Notes  Date: 9.9.19 Time: 0930 Location: [address] Dayroom  Group Topic: Stress Management  Goal Area(s) Addresses:  Patient will verbalize importance of using healthy stress management.  Patient will identify positive emotions associated with healthy stress management.   Intervention: Stress Management  Activity :  [employer].  LRT introduced the stress management technique of meditation.  Patients were to listen as meditation played to engage in the activity.  Education: Stress Management, Discharge Planning.   Education Outcome: Acknowledges edcuation/In group clarification offered/Needs additional education  Clinical Observations/Feedback: Pt did not attend group.    Caroll Rancher, LRT/CTRS       Caroll Rancher A 10/05/2017 12:09 PM

## 2017-10-05 NOTE — Plan of Care (Signed)
  Problem: Education: Goal: Emotional status will improve Outcome: Progressing   Problem: Education: Goal: Mental status will improve Outcome: Progressing   Problem: Education: Goal: Verbalization of understanding the information provided will improve Outcome: Progressing   Problem: Health Behavior/Discharge Planning: Goal: Identification of resources available to assist in meeting health care needs will improve Outcome: Progressing

## 2017-10-05 NOTE — Plan of Care (Signed)
  Problem: Safety: Goal: Periods of time without injury will increase Outcome: Progressing Note:  Pt has not harmed self or others tonight.  He endorses SI without plan or intent.  Pt verbally contracts for safety.  Denies HI.

## 2017-10-05 NOTE — Progress Notes (Addendum)
[facility] MD Progress Note  10/05/2017 11:18 AM Marcus Cummings.  MRN:  [id number]   Subjective:  Reports there has been some improvement compared to admission, but describes ongoing depression. Attributes depression in part to poor local support system and loneliness, and states he has been considering moving to [place] , [place], where he has some family. Describes intermittent passive SI, with reported plan to "overdose on blood pressure medication since that will effect my heart rate." Denies medication side effects , feels they have helped " a little ". Continues to complain of insomnia and requests for adjustment in his Trazodone dosage.   Evaluation on the unit: Face to face evaluation completed, case discussed with treatment team and chart reviewed. Patient is a 52 year old male who was admitted to the unit due to worsening ( chronic) depression, suicidal ideations. At this time patient reports partially improved mood but still feels depressed, sad, and ruminative about poor local support network. States " I have a place to live, but I feel very lonely". Does endorse intermittent pasive SI, but  contracts for safety on unit, and is future oriented- as above, reports potential plan of relocating in order to be closer to family members. He reports continued trouble with falling and staying asleep.  Denies medication side effects. No disruptive or agitated behaviors on unit. DM better controlled- last CBGs 101.   Principal Problem: Severe recurrent major depression without psychotic features (HCC) Diagnosis:   Patient Active Problem List   Diagnosis Date Noted  . Severe recurrent major depression without psychotic features (HCC) [F33.2] 09/30/2017  . 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 Essential hypertension [I10]   . Dyslipidemia [E78.5]   . Chest pain on exertion [R07.9]   . Stable angina (HCC) [I20.8] 06/17/2017   Total Time spent with patient: 20 minutes  Past Psychiatric History:     Past Medical History:  Past Medical History:  Diagnosis Date  . Diabetes mellitus without complication (HCC)   . Hypertension   . MI, acute, non ST segment elevation (HCC) 2007   stents x 2 2007 [facility])  . Stroke [facility]) 2017    Past Surgical History:  Procedure Laterality Date  . CORONARY ARTERY BYPASS GRAFT N/A 06/23/2017   Procedure: CORONARY ARTERY BYPASS GRAFTING (CABG) x 3; Using Left Internal Mammary Artery and Right Leg Greater Saphenous Vein harvested endoscopically.;  Surgeon: Kerin Perna, MD;  Location: [facility] OR;  Service: Open Heart Surgery;  Laterality: N/A;  . LEFT HEART CATH AND CORONARY ANGIOGRAPHY N/A 06/18/2017   Procedure: LEFT HEART CATH AND CORONARY ANGIOGRAPHY;  Surgeon: Lennette Bihari, MD;  Location: [facility] INVASIVE CV LAB;  Service: Cardiovascular;  Laterality: N/A;  . TEE WITHOUT CARDIOVERSION N/A 06/23/2017   Procedure: TRANSESOPHAGEAL ECHOCARDIOGRAM (TEE);  Surgeon: Donata Clay, Theron Arista, MD;  Location: [facility] OR;  Service: Open Heart Surgery;  Laterality: N/A;   Family History:  Family History  Problem Relation Age of Onset  . CAD Mother   . Congestive Heart Failure Father    Family Psychiatric  History:  Social History:  Social History   Substance and Sexual Activity  Alcohol Use Yes   Comment: 1-2  beers a week     Social History   Substance and Sexual Activity  Drug Use Yes  . Types: Marijuana    Social History   Socioeconomic History  . Marital status: Single    Spouse name: Not on file  . Number of children: Not on  file  . Years of education: Not on file  . Highest education level: Not on file  Occupational History  . Not on file  Social Needs  . Financial resource strain: Not on file  . Food insecurity:    Worry: Not on file    Inability: Not on file  . Transportation needs:    Medical: Not on file    Non-medical: Not on file  Tobacco Use  . Smoking status: Never Smoker  . Smokeless tobacco: Never Used  Substance and Sexual  Activity  . Alcohol use: Yes    Comment: 1-2  beers a week  . Drug use: Yes    Types: Marijuana  . Sexual activity: Not on file  Lifestyle  . Physical activity:    Days per week: Not on file    Minutes per session: Not on file  . Stress: Not on file  Relationships  . Social connections:    Talks on phone: Not on file    Gets together: Not on file    Attends religious service: Not on file    Active member of club or organization: Not on file    Attends meetings of clubs or organizations: Not on file    Relationship status: Not on file  Other Topics Concern  . Not on file  Social History Narrative  . Not on file   Additional Social History:      Sleep: Fair  Appetite:  fair   Current Medications: Current Facility-Administered Medications  Medication Dose Route Frequency Provider Last Rate Last Dose  . acetaminophen (TYLENOL) tablet 650 mg  650 mg Oral Q6H PRN Nira Conn A, NP   650 mg at 10/04/17 2117  . alum & mag hydroxide-simeth (MAALOX/MYLANTA) 200-200-20 MG/5ML suspension 30 mL  30 mL Oral Q4H PRN Nira Conn A, NP      . ARIPiprazole (ABILIFY) tablet 2 mg  2 mg Oral Daily Cobos, Rockey Situ, MD   2 mg at 10/05/17 0803  . aspirin EC tablet 325 mg  325 mg Oral Daily Nira Conn A, NP   325 mg at 10/05/17 0805  . atorvastatin (LIPITOR) tablet 80 mg  80 mg Oral q1800 Nira Conn A, NP   80 mg at 10/04/17 1728  . furosemide (LASIX) tablet 40 mg  40 mg Oral Daily Nira Conn A, NP   40 mg at 10/05/17 0803  . insulin aspart (novoLOG) injection 0-15 Units  0-15 Units Subcutaneous TID WC Shon Hale, MD   2 Units at 10/04/17 1724  . insulin aspart (novoLOG) injection 0-5 Units  0-5 Units Subcutaneous QHS Shon Hale, MD   3 Units at 09/30/17 2224  . insulin aspart (novoLOG) injection 3 Units  3 Units Subcutaneous TID WC Shon Hale, MD   3 Units at 10/05/17 0620  . insulin glargine (LANTUS) injection 30 Units  30 Units Subcutaneous QHS Shon Hale, MD    30 Units at 10/04/17 2115  . lisinopril (PRINIVIL,ZESTRIL) tablet 5 mg  5 mg Oral Daily Emokpae, Courage, MD   5 mg at 10/05/17 0805  . magnesium hydroxide (MILK OF MAGNESIA) suspension 30 mL  30 mL Oral Daily PRN Jackelyn Poling, NP      . Melatonin TABS 6 mg  6 mg Oral QHS PRN Nira Conn A, NP      . metFORMIN (GLUCOPHAGE) tablet 1,000 mg  1,000 mg Oral BID WC Nira Conn A, NP   1,000 mg at 10/05/17 0804  . metoprolol tartrate (  LOPRESSOR) tablet 50 mg  50 mg Oral BID Nira Conn A, NP   50 mg at 10/05/17 0805  . potassium chloride SA (K-DUR,KLOR-CON) CR tablet 20 mEq  20 mEq Oral Daily Cobos, Rockey Situ, MD   20 mEq at 10/05/17 0805  . sertraline (ZOLOFT) tablet 75 mg  75 mg Oral Daily Cobos, Rockey Situ, MD   75 mg at 10/05/17 0803  . traZODone (DESYREL) tablet 150 mg  150 mg Oral QHS Thermon Leyland, NP        Lab Results:  Results for orders placed or performed during the hospital encounter of 09/30/17 (from the past 48 hour(s))  Glucose, capillary     Status: Abnormal   Collection Time: 10/03/17 11:56 AM  Result Value Ref Range   Glucose-Capillary 131 (H) 70 - 99 mg/dL   Comment 1 Notify RN    Comment 2 Document in Chart   Glucose, capillary     Status: Abnormal   Collection Time: 10/03/17  5:08 PM  Result Value Ref Range   Glucose-Capillary 294 (H) 70 - 99 mg/dL   Comment 1 Notify RN    Comment 2 Document in Chart   Glucose, capillary     Status: Abnormal   Collection Time: 10/03/17  8:52 PM  Result Value Ref Range   Glucose-Capillary 174 (H) 70 - 99 mg/dL   Comment 1 Notify RN    Comment 2 Document in Chart   Glucose, capillary     Status: Abnormal   Collection Time: 10/04/17  6:00 AM  Result Value Ref Range   Glucose-Capillary 125 (H) 70 - 99 mg/dL   Comment 1 Notify RN    Comment 2 Document in Chart   Glucose, capillary     Status: Abnormal   Collection Time: 10/04/17 11:47 AM  Result Value Ref Range   Glucose-Capillary 101 (H) 70 - 99 mg/dL  Glucose, capillary      Status: Abnormal   Collection Time: 10/04/17  5:12 PM  Result Value Ref Range   Glucose-Capillary 147 (H) 70 - 99 mg/dL  Glucose, capillary     Status: Abnormal   Collection Time: 10/04/17  9:00 PM  Result Value Ref Range   Glucose-Capillary 126 (H) 70 - 99 mg/dL  Glucose, capillary     Status: None   Collection Time: 10/05/17  6:16 AM  Result Value Ref Range   Glucose-Capillary 79 70 - 99 mg/dL    Blood Alcohol level:  Lab Results  Component Value Date   ETH <10 09/29/2017    Metabolic Disorder Labs: Lab Results  Component Value Date   HGBA1C 10.0 (H) 10/01/2017   MPG 240.3 10/01/2017   MPG 165.68 06/17/2017   No results found for: PROLACTIN Lab Results  Component Value Date   CHOL 209 (H) 10/01/2017   TRIG 128 10/01/2017   HDL 32 (L) 10/01/2017   CHOLHDL 6.5 10/01/2017   VLDL 26 10/01/2017   LDLCALC 151 (H) 10/01/2017   LDLCALC 146 (H) 06/19/2017    Physical Findings: AIMS: Facial and Oral Movements Muscles of Facial Expression: None, normal Lips and Perioral Area: None, normal Jaw: None, normal Tongue: None, normal,Extremity Movements Upper (arms, wrists, hands, fingers): None, normal Lower (legs, knees, ankles, toes): None, normal, Trunk Movements Neck, shoulders, hips: None, normal, Overall Severity Severity of abnormal movements (highest score from questions above): None, normal Incapacitation due to abnormal movements: None, normal Patient's awareness of abnormal movements (rate only patient's report): No Awareness, Dental Status Current  problems with teeth and/or dentures?: No Does patient usually wear dentures?: No  CIWA:    COWS:     Musculoskeletal: Strength & Muscle Tone: within normal limits Gait & Station: normal Patient leans: N/A  Psychiatric Specialty Exam: Physical Exam  Nursing note and vitals reviewed. Constitutional: He is oriented to person, place, and time.  Neurological: He is alert and oriented to person, place, and time.     Review of Systems  Psychiatric/Behavioral: Positive for depression, substance abuse (History of cocaine/alcohol use) and suicidal ideas. Negative for hallucinations and memory loss. The patient has insomnia. The patient is not nervous/anxious.   All other systems reviewed and are negative. no chest pain, no shortness of breath, no vomiting  Blood pressure 120/81, pulse 81, temperature 98.6 F (37 C), temperature source Oral, resp. rate 16, height 6' (1.829 m), weight 83.9 kg, SpO2 99 %.Body mass index is 25.09 kg/m.  General Appearance: Fairly Groomed  Eye Contact:  Fair  Speech:  Normal Rate  Volume:  Normal  Mood:  remains depressed, describes limited improvement since admission  Affect:  Constricted  Thought Process:  Linear and Descriptions of Associations: Intact  Orientation:  Other:  fully alert and attentive   Thought Content:  no hallucinations, no delusions   Suicidal Thoughts:  Yes.  with intent/plan -Passive thoughts to overdose on blood pressure medications  Homicidal Thoughts:  No  Memory:  recent and remote grossly intact   Judgement:  Other:  improving   Insight:  Fair  Psychomotor Activity:  Decreased  Concentration:  Concentration: Good and Attention Span: Good  Recall:  Good  Fund of Knowledge:  Good  Language:  Good  Akathisia:  Negative  Handed:  Right  AIMS (if indicated):     Assets:  Communication Skills Desire for Improvement Resilience Social Support  ADL's:  Intact  Cognition:  WNL  Sleep:  Number of Hours: 6.75    Assessment - 52 year old male, admitted for depression, suicidal ideations. Reports some improvement since admission but characterizes it as modest /limited . He is endorsing intermittent passive SI but no suicidal plan or intention and is future oriented, with a potential plan of relocating to [place], [place], where he has some family, as he states loneliness and limited local support are major stressors. Tolerating medications well thus  far . Treatment Plan Summary:  Daily contact with patient to assess and evaluate symptoms and progress in treatment  Treatment Plan reviewed as below 9/9  Continue Zoloft to 75 mgrs QDAY for depression Continue Abilify 2 mgrs QDAY for antidepressant augmentation Increase Trazodone 150 mgrs QHS PRN for insomnia as needed  Continue Metalonin 6 mgr QHS for insomnia Continue DM management Treatment team working on disposition planning options  DAVIS, Vernona Rieger, NP 10/05/2017, 11:18 AM    Patient ID: Marcus Cummings., male   DOB: 1965/02/03, 52 y.o.   MRN: [id number] .Marland KitchenAgree with NP Progress Note

## 2017-10-05 NOTE — Progress Notes (Signed)
Patient self inventory- Patient slept fair last night, sleep medication was requested and was not helpful. Appetite has been poor, energy level low, and concentration poor. Patient rates depression, hopelessness, and anxiety all 10/10. Patient endorses SI, contracts for safety. Endorses arthritis pain. Patient's goal is "feeling better and going to groups."  Patient is compliant with medications prescribed per provider. Patient told Clinical research associate he feels as if his medications are not helping with his depression. Patient was encouraged to speak with the provider about this.  Safety is maintained with 15 minute checks as well as environmental checks. Will continue to monitor.

## 2017-10-05 NOTE — Progress Notes (Signed)
D: Pt was in bed in his room upon initial approach.  Pt presents with depressed affect and mood.  Forwards little information.  Describes his day as "okay" and reports goal is to "just feel better."  He reports he feels "a little" better today than he did yesterday.  Pt denies HI, denies hallucinations, reports chronic bilateral leg pain of 5/10.  He endorses SI "always" without a plan.  Pt verbally contracts for safety.  Pt has been isolative to his room for the majority of the night with few peer interactions.    A: Introduced self to pt.  Actively listened to pt and offered support and encouragement. Medications administered per order.  PRN medication administered for pain.  Q15 minute safety checks maintained.  R: Pt is safe on the unit.  Pt is compliant with medications.  Pt verbally contracts for safety.  Will continue to monitor and assess.

## 2017-10-05 NOTE — Progress Notes (Signed)
Psychoeducational Group Note  Date:  10/05/2017 Time:  2045  Group Topic/Focus:  Wrap-Up Group   Participation Level: Did Not Attend  Participation Quality:  Not Applicable  Affect:  Not Applicable  Cognitive:  Not Applicable  Insight:  Not Applicable  Engagement in Group: Not Applicable  Additional Comments: Pt was notified that group was beginning but remained in bed.   Marcille Buffy 10/05/2017, 9:57 PM

## 2017-10-05 NOTE — Progress Notes (Signed)
Patient ID: Marcus CRONE Sr., male   DOB: January 12, 1966, 52 y.o.   MRN: [id number] D: Patient has been isolative to his room most of the evening. Pt mood and affect appears depressed and flat. Pt reports he feels a little better since admission. Pt did come out for snacks and medications. Denies HI/AVH and pain.pt reports passive all the time without a plan. No behavioral issues noted.  A: Support and encouragement offered as needed to attend groups and engage in milieu. Medications administered as prescribed.  R: Patient is safe and cooperative on unit. Will continue to monitor  for safety and stability.

## 2017-10-06 LAB — GLUCOSE, CAPILLARY
GLUCOSE-CAPILLARY: 135 mg/dL — AB (ref 70–99)
Glucose-Capillary: 119 mg/dL — ABNORMAL HIGH (ref 70–99)
Glucose-Capillary: 98 mg/dL (ref 70–99)
Glucose-Capillary: 251 mg/dL — ABNORMAL HIGH (ref 70–99)

## 2017-10-06 MED ORDER — PALIPERIDONE PALMITATE ER 156 MG/ML IM SUSY
156.0000 mg | PREFILLED_SYRINGE | Freq: Once | INTRAMUSCULAR | Status: AC
  Administered 2017-10-07: 156 mg via INTRAMUSCULAR
  Filled 2017-10-06: qty 1

## 2017-10-06 MED ORDER — PALIPERIDONE ER 3 MG PO TB24
3.0000 mg | ORAL_TABLET | Freq: Every day | ORAL | Status: DC
  Administered 2017-10-06 – 2017-10-13 (×8): 3 mg via ORAL
  Filled 2017-10-06 (×8): qty 1
  Filled 2017-10-06: qty 14
  Filled 2017-10-06: qty 1
  Filled 2017-10-06: qty 14

## 2017-10-06 MED ORDER — NALTREXONE HCL 50 MG PO TABS
50.0000 mg | ORAL_TABLET | Freq: Every day | ORAL | Status: DC
  Administered 2017-10-06 – 2017-10-13 (×8): 50 mg via ORAL
  Filled 2017-10-06: qty 1
  Filled 2017-10-06: qty 14
  Filled 2017-10-06 (×7): qty 1
  Filled 2017-10-06: qty 14
  Filled 2017-10-06 (×2): qty 1

## 2017-10-06 MED ORDER — PALIPERIDONE PALMITATE ER 156 MG/ML IM SUSY
156.0000 mg | PREFILLED_SYRINGE | Freq: Once | INTRAMUSCULAR | Status: DC
  Filled 2017-10-06: qty 1

## 2017-10-06 NOTE — [facility] Group Notes (Signed)
[facility] Group Therapy 10/06/2017 1:15pm  Type of Therapy: [employer] Presentation  Participation Level: Active  Participation Quality: Attentive  Affect: Appropriate  Cognitive: Oriented  Insight: Developing/Improving  Engagement in Therapy: Engaged  Modes of Intervention: Discussion, Education and Socialization  Summary of Progress/Problems: [employer] ([facility]) Speaker came to talk about his personal journey with mental health. The pt processed ways by which to relate to the speaker. MHA speaker provided handouts and educational information pertaining to groups and services offered by the MHA. Pt was engaged in speaker's presentation and was receptive to resources provided.    Marcus Cummings S Marcus Espina, LCSW 10/06/2017 1:47 PM  

## 2017-10-06 NOTE — Progress Notes (Signed)
Pt presents with a flat affect and depressed mood. Pt appeared guarded and forwarded little information during shift assessment. Pt rated on his self inventory sheet: depression 10/10, anxiety 10, hopelessness 10/10. Pt reported improved sleep last night. Pt denies AVH. Pt endorses passive SI with no plan or intent. Pt verbally contracts for safety. Pt compliant with taking meds and no side effects verbalized by pt.  Orders reviewed with pt. Medications administered as ordered per MD. Verbal support provided. Pt encourage to attend groups. 15 minute checks performed for safety.  Pt compliant with tx plan.

## 2017-10-06 NOTE — Progress Notes (Signed)
Adult Psychoeducational Group Note  Date:  10/06/2017 Time: 1600  Group Topic/Focus:  Coping With Mental Health Crisis:   The purpose of this group is to help patients identify strategies for coping with mental health crisis.  Group discusses possible causes of crisis and ways to manage them effectively.  Participation Level:  Active  Participation Quality:  Appropriate  Affect:  Appropriate  Cognitive:  Appropriate  Insight: Appropriate  Engagement in Group:  Engaged  Modes of Intervention:  Discussion and Education  Additional Comments:      

## 2017-10-06 NOTE — Progress Notes (Signed)
[facility] MD Progress Note  10/06/2017 2:01 PM Marcus Bathe Sr.  MRN:  [id number] Subjective: Patient is seen and examined.  Patient is a 52 year old male with a reported past psychiatric history significant for bipolar disorder versus major depression.  He was admitted on 9/4 with suicidal ideation.  He also has a history of alcohol use disorder, PTSD as well as depression.  He originally presented that day with chest pain, but while he was there complained of suicidal ideation.  He had his most recent psychiatric admission at the [facility] approximately 3 months ago.  He is on a long-acting paliperidone injection as well as injectable naltrexone.  [facility]  His drug screen on admission was positive for cocaine and marijuana.  His blood alcohol was less than 10.  He also has a history of diabetes and high blood pressure.  His blood sugar this morning was 98.  His vital signs are stable.  He denied suicidal ideation. Principal Problem: Severe recurrent major depression without psychotic features (HCC) Diagnosis:   Patient Active Problem List   Diagnosis Date Noted  . Severe recurrent major depression without psychotic features (HCC) [F33.2] 09/30/2017  . 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 Essential hypertension [I10]   . Dyslipidemia [E78.5]   . Chest pain on exertion [R07.9]   . Stable angina (HCC) [I20.8] 06/17/2017   Total Time spent with patient: 15 minutes  Past Psychiatric History: See admission H&P  Past Medical History:  Past Medical History:  Diagnosis Date  . Diabetes mellitus without complication (HCC)   . Hypertension   . MI, acute, non ST segment elevation (HCC) 2007   stents x 2 2007 [facility] [place])  . Stroke [facility]) 2017    Past Surgical History:  Procedure Laterality Date  . CORONARY ARTERY BYPASS GRAFT N/A 06/23/2017   Procedure: CORONARY ARTERY BYPASS GRAFTING (CABG) x 3; Using Left Internal Mammary Artery and Right Leg  Greater Saphenous Vein harvested endoscopically.;  Surgeon: Kerin Perna, MD;  Location: [facility] OR;  Service: Open Heart Surgery;  Laterality: N/A;  . LEFT HEART CATH AND CORONARY ANGIOGRAPHY N/A 06/18/2017   Procedure: LEFT HEART CATH AND CORONARY ANGIOGRAPHY;  Surgeon: Lennette Bihari, MD;  Location: [facility] INVASIVE CV LAB;  Service: Cardiovascular;  Laterality: N/A;  . TEE WITHOUT CARDIOVERSION N/A 06/23/2017   Procedure: TRANSESOPHAGEAL ECHOCARDIOGRAM (TEE);  Surgeon: Donata Clay, Theron Arista, MD;  Location: [facility] OR;  Service: Open Heart Surgery;  Laterality: N/A;   Family History:  Family History  Problem Relation Age of Onset  . CAD Mother   . Congestive Heart Failure Father    Family Psychiatric  History: See admission H&P Social History:  Social History   Substance and Sexual Activity  Alcohol Use Yes   Comment: 1-2  beers a week     Social History   Substance and Sexual Activity  Drug Use Yes  . Types: Marijuana    Social History   Socioeconomic History  . Marital status: Single    Spouse name: Not on file  . Number of children: Not on file  . Years of education: Not on file  . Highest education level: Not on file  Occupational History  . Not on file  Social Needs  . Financial resource strain: Not on file  . Food insecurity:    Worry: Not on file    Inability: Not on file  . Transportation needs:    Medical:  Not on file    Non-medical: Not on file  Tobacco Use  . Smoking status: Never Smoker  . Smokeless tobacco: Never Used  Substance and Sexual Activity  . Alcohol use: Yes    Comment: 1-2  beers a week  . Drug use: Yes    Types: Marijuana  . Sexual activity: Not on file  Lifestyle  . Physical activity:    Days per week: Not on file    Minutes per session: Not on file  . Stress: Not on file  Relationships  . Social connections:    Talks on phone: Not on file    Gets together: Not on file    Attends religious service: Not on file    Active member of club or  organization: Not on file    Attends meetings of clubs or organizations: Not on file    Relationship status: Not on file  Other Topics Concern  . Not on file  Social History Narrative  . Not on file   Additional Social History:                         Sleep: Good  Appetite:  Good  Current Medications: Current Facility-Administered Medications  Medication Dose Route Frequency Provider Last Rate Last Dose  . acetaminophen (TYLENOL) tablet 650 mg  650 mg Oral Q6H PRN Nira Conn A, NP   650 mg at 10/04/17 2117  . alum & mag hydroxide-simeth (MAALOX/MYLANTA) 200-200-20 MG/5ML suspension 30 mL  30 mL Oral Q4H PRN Nira Conn A, NP      . aspirin EC tablet 325 mg  325 mg Oral Daily Nira Conn A, NP   325 mg at 10/06/17 0825  . atorvastatin (LIPITOR) tablet 80 mg  80 mg Oral q1800 Nira Conn A, NP   80 mg at 10/05/17 1724  . furosemide (LASIX) tablet 40 mg  40 mg Oral Daily Nira Conn A, NP   40 mg at 10/06/17 0825  . insulin aspart (novoLOG) injection 0-15 Units  0-15 Units Subcutaneous TID WC Shon Hale, MD   5 Units at 10/05/17 1727  . insulin aspart (novoLOG) injection 0-5 Units  0-5 Units Subcutaneous QHS Shon Hale, MD   3 Units at 09/30/17 2224  . insulin aspart (novoLOG) injection 3 Units  3 Units Subcutaneous TID WC Shon Hale, MD   3 Units at 10/06/17 0645  . insulin glargine (LANTUS) injection 30 Units  30 Units Subcutaneous QHS Shon Hale, MD   30 Units at 10/05/17 2114  . lisinopril (PRINIVIL,ZESTRIL) tablet 5 mg  5 mg Oral Daily Emokpae, Courage, MD   5 mg at 10/06/17 0825  . magnesium hydroxide (MILK OF MAGNESIA) suspension 30 mL  30 mL Oral Daily PRN Jackelyn Poling, NP      . Melatonin TABS 6 mg  6 mg Oral QHS PRN Nira Conn A, NP      . metFORMIN (GLUCOPHAGE) tablet 1,000 mg  1,000 mg Oral BID WC Nira Conn A, NP   1,000 mg at 10/06/17 0825  . metoprolol tartrate (LOPRESSOR) tablet 50 mg  50 mg Oral BID Nira Conn A, NP   50 mg  at 10/06/17 0825  . naltrexone (DEPADE) tablet 50 mg  50 mg Oral Daily Antonieta Pert, MD      . paliperidone (INVEGA SUSTENNA) injection 156 mg  156 mg Intramuscular Once Antonieta Pert, MD      . paliperidone (INVEGA) 24 hr  tablet 3 mg  3 mg Oral QHS Antonieta Pert, MD      . potassium chloride SA (K-DUR,KLOR-CON) CR tablet 20 mEq  20 mEq Oral Daily Cobos, Rockey Situ, MD   20 mEq at 10/06/17 0825  . sertraline (ZOLOFT) tablet 75 mg  75 mg Oral Daily Cobos, Rockey Situ, MD   75 mg at 10/06/17 0824  . traZODone (DESYREL) tablet 150 mg  150 mg Oral QHS Thermon Leyland, NP   150 mg at 10/05/17 2114    Lab Results:  Results for orders placed or performed during the hospital encounter of 09/30/17 (from the past 48 hour(s))  Glucose, capillary     Status: Abnormal   Collection Time: 10/04/17  5:12 PM  Result Value Ref Range   Glucose-Capillary 147 (H) 70 - 99 mg/dL  Glucose, capillary     Status: Abnormal   Collection Time: 10/04/17  9:00 PM  Result Value Ref Range   Glucose-Capillary 126 (H) 70 - 99 mg/dL  Glucose, capillary     Status: None   Collection Time: 10/05/17  6:16 AM  Result Value Ref Range   Glucose-Capillary 79 70 - 99 mg/dL  Glucose, capillary     Status: Abnormal   Collection Time: 10/05/17 12:08 PM  Result Value Ref Range   Glucose-Capillary 100 (H) 70 - 99 mg/dL  Glucose, capillary     Status: Abnormal   Collection Time: 10/05/17  5:10 PM  Result Value Ref Range   Glucose-Capillary 218 (H) 70 - 99 mg/dL   Comment 1 Notify RN    Comment 2 Document in Chart   Glucose, capillary     Status: Abnormal   Collection Time: 10/05/17  8:49 PM  Result Value Ref Range   Glucose-Capillary 154 (H) 70 - 99 mg/dL   Comment 1 Notify RN    Comment 2 Document in Chart   Glucose, capillary     Status: Abnormal   Collection Time: 10/06/17  6:09 AM  Result Value Ref Range   Glucose-Capillary 119 (H) 70 - 99 mg/dL   Comment 1 Notify RN    Comment 2 Document in Chart    Glucose, capillary     Status: None   Collection Time: 10/06/17 12:04 PM  Result Value Ref Range   Glucose-Capillary 98 70 - 99 mg/dL   Comment 1 Notify RN    Comment 2 Document in Chart     Blood Alcohol level:  Lab Results  Component Value Date   ETH <10 09/29/2017    Metabolic Disorder Labs: Lab Results  Component Value Date   HGBA1C 10.0 (H) 10/01/2017   MPG 240.3 10/01/2017   MPG 165.68 06/17/2017   No results found for: PROLACTIN Lab Results  Component Value Date   CHOL 209 (H) 10/01/2017   TRIG 128 10/01/2017   HDL 32 (L) 10/01/2017   CHOLHDL 6.5 10/01/2017   VLDL 26 10/01/2017   LDLCALC 151 (H) 10/01/2017   LDLCALC 146 (H) 06/19/2017    Physical Findings: AIMS: Facial and Oral Movements Muscles of Facial Expression: None, normal Lips and Perioral Area: None, normal Jaw: None, normal Tongue: None, normal,Extremity Movements Upper (arms, wrists, hands, fingers): None, normal Lower (legs, knees, ankles, toes): None, normal, Trunk Movements Neck, shoulders, hips: None, normal, Overall Severity Severity of abnormal movements (highest score from questions above): None, normal Incapacitation due to abnormal movements: None, normal Patient's awareness of abnormal movements (rate only patient's report): No Awareness, Dental Status Current problems with  teeth and/or dentures?: No Does patient usually wear dentures?: No  CIWA:    COWS:     Musculoskeletal: Strength & Muscle Tone: within normal limits Gait & Station: normal Patient leans: N/A  Psychiatric Specialty Exam: Physical Exam  Nursing note and vitals reviewed. Constitutional: He is oriented to person, place, and time. He appears well-developed and well-nourished.  HENT:  Head: Normocephalic and atraumatic.  Respiratory: Effort normal.  Neurological: He is alert and oriented to person, place, and time.    ROS  Blood pressure 116/75, pulse 63, temperature 98.6 F (37 C), resp. rate 16, height 6'  (1.829 m), weight 83.9 kg, SpO2 99 %.Body mass index is 25.09 kg/m.  General Appearance: Disheveled  Eye Contact:  Fair  Speech:  Normal Rate  Volume:  Normal  Mood:  Euthymic  Affect:  Constricted  Thought Process:  Coherent and Descriptions of Associations: Circumstantial  Orientation:  Full (Time, Place, and Person)  Thought Content:  Logical  Suicidal Thoughts:  No  Homicidal Thoughts:  No  Memory:  Immediate;   Fair Recent;   Fair Remote;   Fair  Judgement:  Impaired  Insight:  Lacking  Psychomotor Activity:  Decreased  Concentration:  Concentration: Fair and Attention Span: Fair  Recall:  [employer] of Knowledge:  Fair  Language:  Fair  Akathisia:  Negative  Handed:  Right  AIMS (if indicated):     Assets:  Desire for Improvement Housing Resilience  ADL's:  Intact  Cognition:  WNL  Sleep:  Number of Hours: 5     Treatment Plan Summary: Daily contact with patient to assess and evaluate symptoms and progress in treatment, Medication management and Plan : Patient is seen and examined.  Patient is a 52 year old male with the above-stated past psychiatric history is seen in follow-up.1.-bipolar disorder depressed versus major depression, recurrent, severe with psychotic features plus or minus posttraumatic stress disorder-patient has been most recently hospitalized at the [facility], and apparently was placed on paliperidone long-acting injectable.  I am going to stop his Abilify today, and place him on the paliperidone long-acting injection.  He stated he thought he got it at the middle of the month.  We will give him 156 mg IM x1.  We will continue his other psychiatric medications as written.  #2 alcohol dependence/alcohol use disorder-[facility] but [facility] do not have that available.  I will place him on oral naltrexone 50 mg p.o. daily.  #3-diabetes mellitus-currently well controlled with current  regimen.  #4 hypertension-currently stable with current regimen.  #5 chest pain/coronary artery disease-patient denied any chest pain today.  He continues on Lipitor and a coated aspirin.  #6 disposition planning  Antonieta Pert, MD 10/06/2017, 2:01 PM

## 2017-10-06 NOTE — Progress Notes (Signed)
Patient ID: Marcus HAWES Sr., male   DOB: 1965-10-29, 52 y.o.   MRN: [id number] D: Patient has been isolative to his room most of the evening. Pt mood and affect appears depressed and flat. Pt reports he feels a little better since admission. Pt did come out for snacks and medications. Denies HI/AVH and pain.Pt endorses passive all the time without a plan. Pt stated he thinks about suicide all the time.   A: Support and encouragement offered as needed to attend groups and engage in milieu. Medications administered as prescribed.  R: Patient is safe and cooperative on unit. Will continue to monitor  for safety and stability.

## 2017-10-07 LAB — GLUCOSE, CAPILLARY
GLUCOSE-CAPILLARY: 125 mg/dL — AB (ref 70–99)
Glucose-Capillary: 73 mg/dL (ref 70–99)
Glucose-Capillary: 124 mg/dL — ABNORMAL HIGH (ref 70–99)
Glucose-Capillary: 218 mg/dL — ABNORMAL HIGH (ref 70–99)

## 2017-10-07 NOTE — B[facility] Group Notes (Signed)
LCSW Group Therapy Note 10/07/2017 10:39 AM  Type of Therapy/Topic: Group Therapy: Balance in Life  Participation Level: Active  Description of Group:  This group will address the concept of balance and how it feels and looks when one is unbalanced. Patients will be encouraged to process areas in their lives that are out of balance and identify reasons for remaining unbalanced. Facilitators will guide patients in utilizing problem-solving interventions to address and correct the stressor making their life unbalanced. Understanding and applying boundaries will be explored and addressed for obtaining and maintaining a balanced life. Patients will be encouraged to explore ways to assertively make their unbalanced needs known to significant others in their lives, using other group members and facilitator for support and feedback.  Therapeutic Goals: 1. Patient will identify two or more emotions or situations they have that consume much of in their lives. 2. Patient will identify signs/triggers that life has become out of balance:  3. Patient will identify two ways to set boundaries in order to achieve balance in their lives:  4. Patient will demonstrate ability to communicate their needs through discussion and/or role plays  Summary of Patient Progress:  Marcus Cummings was engaged and participated throughout the group session. Marcus Cummings reports that balance for him is "having my ducks in a row". Marcus Cummings reports that going to treatment for substance abuse will assist him in maintaining his balance moving forward.     Therapeutic Modalities:  Cognitive Behavioral Therapy Solution-Focused Therapy Assertiveness Training   Marcus Cummings Clinical Social Worker

## 2017-10-07 NOTE — Progress Notes (Signed)
[facility] MD Progress Note  10/07/2017 1:34 PM Marcus Bathe Sr.  MRN:  [id number] Subjective: Patient is seen and examined.  Patient is a 52 year old male with a reported past psychiatric history significant for bipolar disorder (although I suspect it may be schizoaffective disorder) versus major depression.  He is seen in follow-up.  He also has a history of alcohol use disorder and posttraumatic stress disorder.  Today he stated he feels a little bit better.  He got the oral paliperidone last night, and he received the long-acting paliperidone injection today.  He stated he felt a little bit more relaxed.  He is currently on oral naltrexone as well.  When asked about suicidal thoughts "they are always there".  He was able to smile and engage to a bit today.  From a medical standpoint his blood sugar is stable with a glucose of 124 this a.m. Principal Problem: Severe recurrent major depression without psychotic features (HCC) Diagnosis:   Patient Active Problem List   Diagnosis Date Noted  . Severe recurrent major depression without psychotic features (HCC) [F33.2] 09/30/2017  . 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 Essential hypertension [I10]   . Dyslipidemia [E78.5]   . Chest pain on exertion [R07.9]   . Stable angina (HCC) [I20.8] 06/17/2017   Total Time spent with patient: 15 minutes  Past Psychiatric History: See admission H&P  Past Medical History:  Past Medical History:  Diagnosis Date  . Diabetes mellitus without complication (HCC)   . Hypertension   . MI, acute, non ST segment elevation (HCC) 2007   stents x 2 2007 [facility] [place])  . Stroke [facility]) 2017    Past Surgical History:  Procedure Laterality Date  . CORONARY ARTERY BYPASS GRAFT N/A 06/23/2017   Procedure: CORONARY ARTERY BYPASS GRAFTING (CABG) x 3; Using Left Internal Mammary Artery and Right Leg Greater Saphenous Vein harvested endoscopically.;  Surgeon: Kerin Perna, MD;  Location: [facility] OR;  Service: Open Heart Surgery;   Laterality: N/A;  . LEFT HEART CATH AND CORONARY ANGIOGRAPHY N/A 06/18/2017   Procedure: LEFT HEART CATH AND CORONARY ANGIOGRAPHY;  Surgeon: Lennette Bihari, MD;  Location: [facility] INVASIVE CV LAB;  Service: Cardiovascular;  Laterality: N/A;  . TEE WITHOUT CARDIOVERSION N/A 06/23/2017   Procedure: TRANSESOPHAGEAL ECHOCARDIOGRAM (TEE);  Surgeon: Donata Clay, Theron Arista, MD;  Location: [facility] OR;  Service: Open Heart Surgery;  Laterality: N/A;   Family History:  Family History  Problem Relation Age of Onset  . CAD Mother   . Congestive Heart Failure Father    Family Psychiatric  History: See admission H&P Social History:  Social History   Substance and Sexual Activity  Alcohol Use Yes   Comment: 1-2  beers a week     Social History   Substance and Sexual Activity  Drug Use Yes  . Types: Marijuana    Social History   Socioeconomic History  . Marital status: Single    Spouse name: Not on file  . Number of children: Not on file  . Years of education: Not on file  . Highest education level: Not on file  Occupational History  . Not on file  Social Needs  . Financial resource strain: Not on file  . Food insecurity:    Worry: Not on file    Inability: Not on file  . Transportation needs:    Medical: Not on file    Non-medical: Not on file  Tobacco Use  . Smoking status: Never Smoker  .  Smokeless tobacco: Never Used  Substance and Sexual Activity  . Alcohol use: Yes    Comment: 1-2  beers a week  . Drug use: Yes    Types: Marijuana  . Sexual activity: Not on file  Lifestyle  . Physical activity:    Days per week: Not on file    Minutes per session: Not on file  . Stress: Not on file  Relationships  . Social connections:    Talks on phone: Not on file    Gets together: Not on file    Attends religious service: Not on file    Active member of club or organization: Not on file    Attends meetings of clubs or organizations: Not on file    Relationship status: Not on file  Other  Topics Concern  . Not on file  Social History Narrative  . Not on file   Additional Social History:                         Sleep: Good  Appetite:  Fair  Current Medications: Current Facility-Administered Medications  Medication Dose Route Frequency Provider Last Rate Last Dose  . acetaminophen (TYLENOL) tablet 650 mg  650 mg Oral Q6H PRN Nira Conn A, NP   650 mg at 10/04/17 2117  . alum & mag hydroxide-simeth (MAALOX/MYLANTA) 200-200-20 MG/5ML suspension 30 mL  30 mL Oral Q4H PRN Nira Conn A, NP      . aspirin EC tablet 325 mg  325 mg Oral Daily Nira Conn A, NP   325 mg at 10/07/17 0833  . atorvastatin (LIPITOR) tablet 80 mg  80 mg Oral q1800 Nira Conn A, NP   80 mg at 10/06/17 1721  . furosemide (LASIX) tablet 40 mg  40 mg Oral Daily Nira Conn A, NP   40 mg at 10/07/17 0833  . insulin aspart (novoLOG) injection 0-15 Units  0-15 Units Subcutaneous TID WC Shon Hale, MD   2 Units at 10/07/17 1214  . insulin aspart (novoLOG) injection 0-5 Units  0-5 Units Subcutaneous QHS Shon Hale, MD   3 Units at 09/30/17 2224  . insulin aspart (novoLOG) injection 3 Units  3 Units Subcutaneous TID WC Shon Hale, MD   3 Units at 10/07/17 1215  . insulin glargine (LANTUS) injection 30 Units  30 Units Subcutaneous QHS Shon Hale, MD   30 Units at 10/06/17 2142  . lisinopril (PRINIVIL,ZESTRIL) tablet 5 mg  5 mg Oral Daily Emokpae, Courage, MD   5 mg at 10/07/17 0833  . magnesium hydroxide (MILK OF MAGNESIA) suspension 30 mL  30 mL Oral Daily PRN Jackelyn Poling, NP      . Melatonin TABS 6 mg  6 mg Oral QHS PRN Nira Conn A, NP      . metFORMIN (GLUCOPHAGE) tablet 1,000 mg  1,000 mg Oral BID WC Nira Conn A, NP   1,000 mg at 10/07/17 0833  . metoprolol tartrate (LOPRESSOR) tablet 50 mg  50 mg Oral BID Nira Conn A, NP   50 mg at 10/07/17 0833  . naltrexone (DEPADE) tablet 50 mg  50 mg Oral Daily Antonieta Pert, MD   50 mg at 10/07/17 1254  .  paliperidone (INVEGA) 24 hr tablet 3 mg  3 mg Oral QHS Antonieta Pert, MD   3 mg at 10/06/17 2141  . potassium chloride SA (K-DUR,KLOR-CON) CR tablet 20 mEq  20 mEq Oral Daily Cobos, Rockey Situ, MD  20 mEq at 10/07/17 0834  . sertraline (ZOLOFT) tablet 75 mg  75 mg Oral Daily Cobos, Rockey Situ, MD   75 mg at 10/07/17 0833  . traZODone (DESYREL) tablet 150 mg  150 mg Oral QHS Thermon Leyland, NP   150 mg at 10/06/17 2141    Lab Results:  Results for orders placed or performed during the hospital encounter of 09/30/17 (from the past 48 hour(s))  Glucose, capillary     Status: Abnormal   Collection Time: 10/05/17  5:10 PM  Result Value Ref Range   Glucose-Capillary 218 (H) 70 - 99 mg/dL   Comment 1 Notify RN    Comment 2 Document in Chart   Glucose, capillary     Status: Abnormal   Collection Time: 10/05/17  8:49 PM  Result Value Ref Range   Glucose-Capillary 154 (H) 70 - 99 mg/dL   Comment 1 Notify RN    Comment 2 Document in Chart   Glucose, capillary     Status: Abnormal   Collection Time: 10/06/17  6:09 AM  Result Value Ref Range   Glucose-Capillary 119 (H) 70 - 99 mg/dL   Comment 1 Notify RN    Comment 2 Document in Chart   Glucose, capillary     Status: None   Collection Time: 10/06/17 12:04 PM  Result Value Ref Range   Glucose-Capillary 98 70 - 99 mg/dL   Comment 1 Notify RN    Comment 2 Document in Chart   Glucose, capillary     Status: Abnormal   Collection Time: 10/06/17  5:05 PM  Result Value Ref Range   Glucose-Capillary 251 (H) 70 - 99 mg/dL   Comment 1 Notify RN    Comment 2 Document in Chart   Glucose, capillary     Status: Abnormal   Collection Time: 10/06/17  9:15 PM  Result Value Ref Range   Glucose-Capillary 135 (H) 70 - 99 mg/dL  Glucose, capillary     Status: None   Collection Time: 10/07/17  6:08 AM  Result Value Ref Range   Glucose-Capillary 73 70 - 99 mg/dL  Glucose, capillary     Status: Abnormal   Collection Time: 10/07/17 12:08 PM  Result  Value Ref Range   Glucose-Capillary 124 (H) 70 - 99 mg/dL    Blood Alcohol level:  Lab Results  Component Value Date   ETH <10 09/29/2017    Metabolic Disorder Labs: Lab Results  Component Value Date   HGBA1C 10.0 (H) 10/01/2017   MPG 240.3 10/01/2017   MPG 165.68 06/17/2017   No results found for: PROLACTIN Lab Results  Component Value Date   CHOL 209 (H) 10/01/2017   TRIG 128 10/01/2017   HDL 32 (L) 10/01/2017   CHOLHDL 6.5 10/01/2017   VLDL 26 10/01/2017   LDLCALC 151 (H) 10/01/2017   LDLCALC 146 (H) 06/19/2017    Physical Findings: AIMS: Facial and Oral Movements Muscles of Facial Expression: None, normal Lips and Perioral Area: None, normal Jaw: None, normal Tongue: None, normal,Extremity Movements Upper (arms, wrists, hands, fingers): None, normal Lower (legs, knees, ankles, toes): None, normal, Trunk Movements Neck, shoulders, hips: None, normal, Overall Severity Severity of abnormal movements (highest score from questions above): None, normal Incapacitation due to abnormal movements: None, normal Patient's awareness of abnormal movements (rate only patient's report): No Awareness, Dental Status Current problems with teeth and/or dentures?: No Does patient usually wear dentures?: No  CIWA:    COWS:     Musculoskeletal: Strength &  Muscle Tone: within normal limits Gait & Station: normal Patient leans: N/A  Psychiatric Specialty Exam: Physical Exam  Nursing note reviewed. Constitutional: He is oriented to person, place, and time. He appears well-developed and well-nourished.  HENT:  Head: Normocephalic and atraumatic.  Respiratory: Effort normal.  Neurological: He is alert and oriented to person, place, and time.    ROS  Blood pressure 109/75, pulse 78, temperature 98.2 F (36.8 C), temperature source Oral, resp. rate 16, height 6' (1.829 m), weight 83.9 kg, SpO2 100 %.Body mass index is 25.09 kg/m.  General Appearance: Casual  Eye Contact:  Fair   Speech:  Normal Rate  Volume:  Decreased  Mood:  Anxious  Affect:  Congruent  Thought Process:  Coherent and Descriptions of Associations: Intact  Orientation:  Full (Time, Place, and Person)  Thought Content:  Logical  Suicidal Thoughts:  Yes.  without intent/plan  Homicidal Thoughts:  No  Memory:  Immediate;   Fair Recent;   Fair Remote;   Fair  Judgement:  Intact  Insight:  Fair  Psychomotor Activity:  Normal  Concentration:  Concentration: Fair and Attention Span: Fair  Recall:  [employer] of Knowledge:  Fair  Language:  Fair  Akathisia:  Negative  Handed:  Right  AIMS (if indicated):     Assets:  Desire for Improvement Housing Resilience  ADL's:  Intact  Cognition:  WNL  Sleep:  Number of Hours: 6.5     Treatment Plan Summary: Daily contact with patient to assess and evaluate symptoms and progress in treatment, Medication management and Plan : Patient is seen and examined.  Patient is a 52 year old male with the above-stated past psychiatric history who is seen in follow-up.  #1-bipolar disorder; depressed versus schizoaffective disorder; depressed versus major depression; recurrent, severe with psychotic features-patient appears to be slightly better today.  He is more engaging.  He slept better.  He continues on Zoloft 75 mg p.o. daily.  Additionally he was switched to paliperidone oral 3 mg nightly, and he received the paliperidone long-acting injection today at 156 mg.  No changes in his current medications.  #2 substance issues-patient would like to be considered for a long-term residential treatment program.  I will discuss with social work the possibility that he may go to the [facility] in [place].  They have a substance abuse treatment program there.  I am not sure what his service connection is, or whether he would even be eligible but it might be worth exploring..  #3 diabetes mellitus-currently well controlled on current regimen.  #4  hypertension-stable on Lopressor and lisinopril.  #5-discharge planning-depending on whether or not he is eligible for residential program versus outpatient program this will be determined over the next several days.  Antonieta Pert, MD 10/07/2017, 1:34 PM

## 2017-10-07 NOTE — Progress Notes (Signed)
Patient ID: Marcus EARNST Sr., male   DOB: [birth year], 52 y.o.   MRN: [id number]  D: Patient denies HI and auditory and visual hallucinations.Endorses passive SI. Patient has a depressed mood and flat  affect.   A: Patient given emotional support from RN. Patient given medications per MD orders. Patient encouraged to attend groups and unit activities. Patient encouraged to come to staff with any questions or concerns.  R: Patient remains cooperative and appropriate. Will continue to monitor patient for safety.

## 2017-10-07 NOTE — Progress Notes (Signed)
Recreation Therapy Notes  Date: 9.11.19 Time: 0930 Location: 300 Hall Dayroom  Group Topic: Stress Management  Goal Area(s) Addresses:  Patient will verbalize importance of using healthy stress management.  Patient will identify positive emotions associated with healthy stress management.   Behavioral Response: Engaged  Intervention: Stress Management  Activity :  Guided Imagery.  LRT introduced the stress management technique of guided imagery.  LRT read a script for patients to envision seeing a starry sky at night.  Patients were to follow along as script was read to engage in activity.  Education:  Stress Management, Discharge Planning.   Education Outcome: Acknowledges edcuation/In group clarification offered/Needs additional education  Clinical Observations/Feedback: Pt attended and participated in group.    Caroll Rancher, LRT/CTRS         Caroll Rancher A 10/07/2017 11:54 AM

## 2017-10-07 NOTE — Therapy (Signed)
Occupational Therapy Group Note  Date:  10/07/2017 Time:  1:12 PM  Group Topic/Focus:  Self Esteem Action Plan:   The focus of this group is to help patients create a plan to continue to build self-esteem after discharge.  Participation Level:  Minimal  Participation Quality:  Inattentive  Affect:  Flat  Cognitive:  Appropriate  Insight: Lacking  Engagement in Group:  Limited  Modes of Intervention:  Activity, Discussion, Education and Socialization  Additional Comments:    S: "No I don't want to share anything"  O:Education given on self esteem, its definition, and how it becomes negative vs positive. Self esteem education given on its relation to MH. Pt encouraged to contribute in discussion and brainstorm. Self esteem activity completed where pt is to name a positive word for each letter of the alphabet (A-Z). Pt then to complete coat of arms activity to encompass roles, values, goals, and favorite qualities. Coloring encouraged within this activity. Positive affirmations worksheet given at end of session for pt to practice and continue building this skill.  A: Pt presents to group with flat affect, minimally engaged and participatory throughout session. Pt observed completing both A-Z and coat of arms activity, not willing to share despite prompting and education. Accepted positive affirmations at end of session.  P: Handouts given to facilitate carryover into community.  Dalphine Handing, MSOT, OTR/L  [place] 10/07/2017, 1:12 PM

## 2017-10-07 NOTE — Progress Notes (Signed)
D: Pt was in bed in his room upon initial approach.  Pt presents with depressed affect and mood.  Denies having a goal, stating "I ain't set one."  Pt and writer made goal for pt to "be safe."  Pt denies HI, denies hallucinations, denies pain.  He endorses constant SI without plan and verbally contracts for safety.  Pt has been isolative to his room for the majority of the evening.    A: Introduced self to pt.  Actively listened to pt and offered support and encouragement. Medications administered per order.  PRN medication administered for pain.  Q15 minute safety checks maintained.  R: Pt is safe on the unit.  Pt is compliant with medications.  Pt verbally contracts for safety.  Will continue to monitor and assess.

## 2017-10-08 DIAGNOSIS — F259 Schizoaffective disorder, unspecified: Secondary | ICD-10-CM

## 2017-10-08 LAB — GLUCOSE, CAPILLARY
GLUCOSE-CAPILLARY: 73 mg/dL (ref 70–99)
GLUCOSE-CAPILLARY: 115 mg/dL — AB (ref 70–99)
Glucose-Capillary: 60 mg/dL — ABNORMAL LOW (ref 70–99)
Glucose-Capillary: 251 mg/dL — ABNORMAL HIGH (ref 70–99)
Glucose-Capillary: 131 mg/dL — ABNORMAL HIGH (ref 70–99)

## 2017-10-08 NOTE — Progress Notes (Signed)
[facility] MD Progress Note  10/08/2017 2:28 PM Marcus Bathe Sr.  MRN:  [id number]   Subjective: Patient reports that he feels that nothing is changed since yesterday.  He reports that he still has some suicidal thoughts.  He denies any HI/AVH and contracts for safety on the unit.  He reports that he would like to return to the [facility] for treatment.  He reports that the medications seem to be doing okay at this time, and denies any medication side effects.  He reports that his sleep has improved but his appetite is still down.  He has no backup or additional plans if he cannot go to the [facility].  Objective: Patient's chart and findings reviewed and discussed with treatment team.  Patient presents in his room lying in the bed but is awake.  He is pleasant and calm and cooperative.  He continues to have a flat affect and slow with decreased volume and voice.  Thought was CSW and she feels that there is a possibility he may not be accepted as he is went to the [facility] 20+ times.  He is offered to contact them himself and speak to the administrator on duty, but instead he accepted the CSW's offer for a referral to [facility] residential.   Principal Problem: Severe recurrent major depression without psychotic features [facility]) Diagnosis:   Patient Active Problem List   Diagnosis Date Noted  . Severe recurrent major depression without psychotic features (HCC) [F33.2] 09/30/2017  . 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 Essential hypertension [I10]   . Dyslipidemia [E78.5]   . Chest pain on exertion [R07.9]   . Stable angina (HCC) [I20.8] 06/17/2017   Total Time spent with patient: 20 minutes  Past Psychiatric History: See H&P  Past Medical History:  Past Medical History:  Diagnosis Date  . Diabetes mellitus without complication (HCC)   . Hypertension   . MI, acute, non ST segment elevation (HCC) 2007   stents x 2 2007 [facility] [place])  . Stroke [facility]) 2017    Past Surgical  History:  Procedure Laterality Date  . CORONARY ARTERY BYPASS GRAFT N/A 06/23/2017   Procedure: CORONARY ARTERY BYPASS GRAFTING (CABG) x 3; Using Left Internal Mammary Artery and Right Leg Greater Saphenous Vein harvested endoscopically.;  Surgeon: Kerin Perna, MD;  Location: [facility] OR;  Service: Open Heart Surgery;  Laterality: N/A;  . LEFT HEART CATH AND CORONARY ANGIOGRAPHY N/A 06/18/2017   Procedure: LEFT HEART CATH AND CORONARY ANGIOGRAPHY;  Surgeon: Lennette Bihari, MD;  Location: [facility] INVASIVE CV LAB;  Service: Cardiovascular;  Laterality: N/A;  . TEE WITHOUT CARDIOVERSION N/A 06/23/2017   Procedure: TRANSESOPHAGEAL ECHOCARDIOGRAM (TEE);  Surgeon: Donata Clay, Theron Arista, MD;  Location: [facility] OR;  Service: Open Heart Surgery;  Laterality: N/A;   Family History:  Family History  Problem Relation Age of Onset  . CAD Mother   . Congestive Heart Failure Father    Family Psychiatric  History: See H&P Social History:  Social History   Substance and Sexual Activity  Alcohol Use Yes   Comment: 1-2  beers a week     Social History   Substance and Sexual Activity  Drug Use Yes  . Types: Marijuana    Social History   Socioeconomic History  . Marital status: Single    Spouse name: Not on file  . Number of children: Not on file  . Years of education: Not on file  . Highest education level:  Not on file  Occupational History  . Not on file  Social Needs  . Financial resource strain: Not on file  . Food insecurity:    Worry: Not on file    Inability: Not on file  . Transportation needs:    Medical: Not on file    Non-medical: Not on file  Tobacco Use  . Smoking status: Never Smoker  . Smokeless tobacco: Never Used  Substance and Sexual Activity  . Alcohol use: Yes    Comment: 1-2  beers a week  . Drug use: Yes    Types: Marijuana  . Sexual activity: Not on file  Lifestyle  . Physical activity:    Days per week: Not on file    Minutes per session: Not on file  . Stress: Not on  file  Relationships  . Social connections:    Talks on phone: Not on file    Gets together: Not on file    Attends religious service: Not on file    Active member of club or organization: Not on file    Attends meetings of clubs or organizations: Not on file    Relationship status: Not on file  Other Topics Concern  . Not on file  Social History Narrative  . Not on file   Additional Social History:                         Sleep: Good  Appetite:  Fair  Current Medications: Current Facility-Administered Medications  Medication Dose Route Frequency Provider Last Rate Last Dose  . acetaminophen (TYLENOL) tablet 650 mg  650 mg Oral Q6H PRN Nira ConnBerry, Jason A, NP   650 mg at 10/07/17 2131  . alum & mag hydroxide-simeth (MAALOX/MYLANTA) 200-200-20 MG/5ML suspension 30 mL  30 mL Oral Q4H PRN Nira ConnBerry, Jason A, NP      . aspirin EC tablet 325 mg  325 mg Oral Daily Nira ConnBerry, Jason A, NP   325 mg at 10/08/17 0833  . atorvastatin (LIPITOR) tablet 80 mg  80 mg Oral q1800 Nira ConnBerry, Jason A, NP   80 mg at 10/07/17 1710  . furosemide (LASIX) tablet 40 mg  40 mg Oral Daily Nira ConnBerry, Jason A, NP   40 mg at 10/08/17 0833  . insulin aspart (novoLOG) injection 0-15 Units  0-15 Units Subcutaneous TID WC Shon HaleEmokpae, Courage, MD   2 Units at 10/07/17 1711  . insulin aspart (novoLOG) injection 0-5 Units  0-5 Units Subcutaneous QHS Shon HaleEmokpae, Courage, MD   3 Units at 09/30/17 2224  . insulin aspart (novoLOG) injection 3 Units  3 Units Subcutaneous TID WC Shon HaleEmokpae, Courage, MD   3 Units at 10/08/17 0615  . insulin glargine (LANTUS) injection 30 Units  30 Units Subcutaneous QHS Shon HaleEmokpae, Courage, MD   30 Units at 10/07/17 2132  . lisinopril (PRINIVIL,ZESTRIL) tablet 5 mg  5 mg Oral Daily Emokpae, Courage, MD   5 mg at 10/08/17 0834  . magnesium hydroxide (MILK OF MAGNESIA) suspension 30 mL  30 mL Oral Daily PRN Jackelyn PolingBerry, Jason A, NP      . Melatonin TABS 6 mg  6 mg Oral QHS PRN Nira ConnBerry, Jason A, NP      . metFORMIN  (GLUCOPHAGE) tablet 1,000 mg  1,000 mg Oral BID WC Nira ConnBerry, Jason A, NP   1,000 mg at 10/08/17 0833  . metoprolol tartrate (LOPRESSOR) tablet 50 mg  50 mg Oral BID Nira ConnBerry, Jason A, NP   50 mg at 10/08/17  9604  . naltrexone (DEPADE) tablet 50 mg  50 mg Oral Daily Antonieta Pert, MD   50 mg at 10/08/17 0834  . paliperidone (INVEGA) 24 hr tablet 3 mg  3 mg Oral QHS Antonieta Pert, MD   3 mg at 10/07/17 2131  . potassium chloride SA (K-DUR,KLOR-CON) CR tablet 20 mEq  20 mEq Oral Daily Cobos, Rockey Situ, MD   20 mEq at 10/08/17 0834  . sertraline (ZOLOFT) tablet 75 mg  75 mg Oral Daily Cobos, Rockey Situ, MD   75 mg at 10/08/17 0834  . traZODone (DESYREL) tablet 150 mg  150 mg Oral QHS Thermon Leyland, NP   150 mg at 10/07/17 2131    Lab Results:  Results for orders placed or performed during the hospital encounter of 09/30/17 (from the past 48 hour(s))  Glucose, capillary     Status: Abnormal   Collection Time: 10/06/17  5:05 PM  Result Value Ref Range   Glucose-Capillary 251 (H) 70 - 99 mg/dL   Comment 1 Notify RN    Comment 2 Document in Chart   Glucose, capillary     Status: Abnormal   Collection Time: 10/06/17  9:15 PM  Result Value Ref Range   Glucose-Capillary 135 (H) 70 - 99 mg/dL  Glucose, capillary     Status: None   Collection Time: 10/07/17  6:08 AM  Result Value Ref Range   Glucose-Capillary 73 70 - 99 mg/dL  Glucose, capillary     Status: Abnormal   Collection Time: 10/07/17 12:08 PM  Result Value Ref Range   Glucose-Capillary 124 (H) 70 - 99 mg/dL  Glucose, capillary     Status: Abnormal   Collection Time: 10/07/17  4:57 PM  Result Value Ref Range   Glucose-Capillary 218 (H) 70 - 99 mg/dL   Comment 1 Notify RN    Comment 2 Document in Chart   Glucose, capillary     Status: Abnormal   Collection Time: 10/07/17  9:30 PM  Result Value Ref Range   Glucose-Capillary 125 (H) 70 - 99 mg/dL  Glucose, capillary     Status: None   Collection Time: 10/08/17  6:10 AM  Result  Value Ref Range   Glucose-Capillary 73 70 - 99 mg/dL  Glucose, capillary     Status: Abnormal   Collection Time: 10/08/17 12:10 PM  Result Value Ref Range   Glucose-Capillary 60 (L) 70 - 99 mg/dL  Glucose, capillary     Status: Abnormal   Collection Time: 10/08/17 12:42 PM  Result Value Ref Range   Glucose-Capillary 115 (H) 70 - 99 mg/dL    Blood Alcohol level:  Lab Results  Component Value Date   ETH <10 09/29/2017    Metabolic Disorder Labs: Lab Results  Component Value Date   HGBA1C 10.0 (H) 10/01/2017   MPG 240.3 10/01/2017   MPG 165.68 06/17/2017   No results found for: PROLACTIN Lab Results  Component Value Date   CHOL 209 (H) 10/01/2017   TRIG 128 10/01/2017   HDL 32 (L) 10/01/2017   CHOLHDL 6.5 10/01/2017   VLDL 26 10/01/2017   LDLCALC 151 (H) 10/01/2017   LDLCALC 146 (H) 06/19/2017    Physical Findings: AIMS: Facial and Oral Movements Muscles of Facial Expression: None, normal Lips and Perioral Area: None, normal Jaw: None, normal Tongue: None, normal,Extremity Movements Upper (arms, wrists, hands, fingers): None, normal Lower (legs, knees, ankles, toes): None, normal, Trunk Movements Neck, shoulders, hips: None, normal, Overall Severity Severity  of abnormal movements (highest score from questions above): None, normal Incapacitation due to abnormal movements: None, normal Patient's awareness of abnormal movements (rate only patient's report): No Awareness, Dental Status Current problems with teeth and/or dentures?: No Does patient usually wear dentures?: No  CIWA:    COWS:     Musculoskeletal: Strength & Muscle Tone: within normal limits Gait & Station: normal Patient leans: N/A  Psychiatric Specialty Exam: Physical Exam  Nursing note and vitals reviewed. Constitutional: He is oriented to person, place, and time. He appears well-developed and well-nourished.  Cardiovascular: Normal rate.  Respiratory: Effort normal.  Musculoskeletal: Normal  range of motion.  Neurological: He is alert and oriented to person, place, and time.  Skin: Skin is warm.    Review of Systems  Constitutional: Negative.   HENT: Negative.   Eyes: Negative.   Respiratory: Negative.   Cardiovascular: Negative.   Gastrointestinal: Negative.   Genitourinary: Negative.   Musculoskeletal: Negative.   Skin: Negative.   Neurological: Negative.   Endo/Heme/Allergies: Negative.   Psychiatric/Behavioral: Positive for depression and suicidal ideas. Negative for hallucinations. The patient is nervous/anxious.     Blood pressure 114/72, pulse 75, temperature 98.4 F (36.9 C), temperature source Oral, resp. rate 16, height 6' (1.829 m), weight 83.9 kg, SpO2 100 %.Body mass index is 25.09 kg/m.  General Appearance: Disheveled  Eye Contact:  Fair  Speech:  Slow  Volume:  Decreased  Mood:  Depressed  Affect:  Flat  Thought Process:  Linear and Descriptions of Associations: Intact  Orientation:  Full (Time, Place, and Person)  Thought Content:  WDL  Suicidal Thoughts:  Yes.  without intent/plan  Homicidal Thoughts:  No  Memory:  Immediate;   Good Recent;   Good Remote;   Good  Judgement:  Fair  Insight:  Fair  Psychomotor Activity:  Normal  Concentration:  Concentration: Good and Attention Span: Good  Recall:  Good  Fund of Knowledge:  Good  Language:  Good  Akathisia:  No  Handed:  Right  AIMS (if indicated):     Assets:  Communication Skills Desire for Improvement Financial Resources/Insurance Resilience Social Support Transportation  ADL's:  Intact  Cognition:  WNL  Sleep:  Number of Hours: 6.75   Problems addressed Schizoaffective disorder  Treatment Plan Summary: Daily contact with patient to assess and evaluate symptoms and progress in treatment, Medication management and Plan is to: Continue Invega Sustenna 156 mg IM injection every 28 days last dose was 10/07/2017 Continue in [place] 3 mg p.o. daily for mood stability Continue Zoloft  75 mg p.o. daily for mood stability continue trazodone 150 mg nightly for mood stability and sleep Continue naltrexone 50 mg p.o. daily Encourage group therapy participation A CSW to send referral to [facility] residential  Maryfrances Bunnell, FNP 10/08/2017, 2:28 PM

## 2017-10-08 NOTE — [facility] Group Notes (Signed)
LCSW Group Therapy Note   10/08/2017 1:15pm   Type of Therapy and Topic:  Group Therapy:  Overcoming Obstacles   Participation Level:  Did Not Attend--pt invited. Chose to remain in bed.    Description of Group:    In this group patients will be encouraged to explore what they see as obstacles to their own wellness and recovery. They will be guided to discuss their thoughts, feelings, and behaviors related to these obstacles. The group will process together ways to cope with barriers, with attention given to specific choices patients can make. Each patient will be challenged to identify changes they are motivated to make in order to overcome their obstacles. This group will be process-oriented, with patients participating in exploration of their own experiences as well as giving and receiving support and challenge from other group members.   Therapeutic Goals: 1. Patient will identify personal and current obstacles as they relate to admission. 2. Patient will identify barriers that currently interfere with their wellness or overcoming obstacles.  3. Patient will identify feelings, thought process and behaviors related to these barriers. 4. Patient will identify two changes they are willing to make to overcome these obstacles:      Summary of Patient Progress   x   Therapeutic Modalities:   Cognitive Behavioral Therapy Solution Focused Therapy Motivational Interviewing Relapse Prevention Therapy  Rona RavensHeather S Gisell Buehrle, LCSW 10/08/2017 11:42 AM

## 2017-10-08 NOTE — Progress Notes (Signed)
Psychoeducational Group Note  Date:  10/08/2017 Time:  2045  Group Topic/Focus:  wrap up group  Participation Level: Did Not Attend  Participation Quality:  Not Applicable  Affect:  Not Applicable  Cognitive:  Not Applicable  Insight:  Not Applicable  Engagement in Group: Not Applicable  Additional Comments:  Pt was notified that group was beginning but remained in bed.   Marcille BuffyMcNeil, Ziyan Hillmer S 10/08/2017, 10:05 PM

## 2017-10-08 NOTE — Progress Notes (Signed)
D: Pt was in bed in his room upon initial approach.  Pt presents with depressed affect and mood.  Forwards little information and remains isolative.  Describes his day as "okay" and reports he has "been going to the groups."  Pt denies HI, denies hallucinations, reports bilateral leg pain of 5/10.  He continues to report SI.  Pt reports he does have a plan but did not disclose plan with Clinical research associatewriter.  He verbally contracts for safety.  Pt did not attend evening group tonight.     A: Introduced self to pt.  Actively listened to pt and offered support and encouragement. Medications administered per order.  PRN medication administered for pain.  Q15 minute safety checks maintained.  R: Pt is safe on the unit.  Pt is compliant with medications.  Pt verbally contracts for safety.  Will continue to monitor and assess.

## 2017-10-08 NOTE — Progress Notes (Signed)
DAR NOTE: Patient presents with anxious affect and depressed mood. Pt stays in the bed most of the day, endorsing passive SI with a plan to OD on sleep medication when he leaves here. Pt stated he has nothing to live for, his children does want anything to do with him. Pt is diabetic, his CBG at lunch time 60 mg/dl, insulin with held.  Reports fair sleep last night, fair appetite, low energy and poor  Concentration. Denies pain, auditory and visual hallucinations.  Rates depression at 10, hopelessness at 10, and anxiety at 10.  Maintained on routine safety checks.  Medications given as prescribed.  Support and encouragement offered as needed. States goal for today is " is feeling better." Will continue to monitor 

## 2017-10-09 DIAGNOSIS — F332 Major depressive disorder, recurrent severe without psychotic features: Secondary | ICD-10-CM | POA: Diagnosis present

## 2017-10-09 LAB — GLUCOSE, CAPILLARY
GLUCOSE-CAPILLARY: 111 mg/dL — AB (ref 70–99)
GLUCOSE-CAPILLARY: 127 mg/dL — AB (ref 70–99)
Glucose-Capillary: 73 mg/dL (ref 70–99)
Glucose-Capillary: 136 mg/dL — ABNORMAL HIGH (ref 70–99)

## 2017-10-09 NOTE — Progress Notes (Signed)
Patient did not attend the evening speaker AA meeting. Pt was notified that group was beginning but remained in bed.   

## 2017-10-09 NOTE — [facility] Group Notes (Signed)
LCSW Group Therapy Note  10/09/2017 1:15pm  Type of Therapy and Topic:  Group Therapy:  Feelings around Relapse and Recovery  Participation Level:  Did Not Attend--pt invited. Chose to remain in bed.    Description of Group:    Patients in this group will discuss emotions they experience before and after a relapse. They will process how experiencing these feelings, or avoidance of experiencing them, relates to having a relapse. Facilitator will guide patients to explore emotions they have related to recovery. Patients will be encouraged to process which emotions are more powerful. They will be guided to discuss the emotional reaction significant others in their lives may have to their relapse or recovery. Patients will be assisted in exploring ways to respond to the emotions of others without this contributing to a relapse.  Therapeutic Goals: 1. Patient will identify two or more emotions that lead to a relapse for them 2. Patient will identify two emotions that result when they relapse 3. Patient will identify two emotions related to recovery 4. Patient will demonstrate ability to communicate their needs through discussion and/or role plays   Summary of Patient Progress:  x   Therapeutic Modalities:   Cognitive Behavioral Therapy Solution-Focused Therapy Assertiveness Training Relapse Prevention Therapy   Nieves Chapa S Railee Bonillas, LCSW 10/09/2017 1:00 PM  

## 2017-10-09 NOTE — Progress Notes (Signed)
[facility]  10/09/2017 1:47 PM Velvet BatheGregory L Menees Sr.  MRN:  [id number] Subjective: Patient is seen and examined.  Patient is a 52 year old male with a reported past psychiatric history significant for schizoaffective disorder plus or minus major depression.  He also has a history of alcohol use disorder and reportedly posttraumatic stress disorder.  He stated he is unhappy today.  We [facility] him in transfer, and they have declined this.  They stated he had been hospitalized at least 20 times, and that they really did not have anything to offer him besides what he could get here.  We also found out that he is currently [facility].  He stated that he continues to "needs something more".  He is unable to verbalize that.  He stated he does not understand why the voices do not go away.  We discussed his illness.  Things are better with the paliperidone long-acting injection as well as the 3 mg at bedtime.  He is sleeping better.  He did state that the voices were less.  We discussed the possibility of [facility] [place] [facility], and once I explained what they had their he is not really interested in pursuing that.  Social work has discussed with him the possibility of going to a rehabilitation facility.  His blood sugar this morning is 136.  His vital signs are stable, and he is afebrile. Principal Problem: Schizoaffective disorder (HCC) Diagnosis:   Patient Active Problem List   Diagnosis Date Noted  . Schizoaffective disorder (HCC) [F25.9] 09/30/2017  . 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 Essential hypertension [I10]   . Dyslipidemia [E78.5]   . Chest pain on exertion [R07.9]   . Stable angina (HCC) [I20.8] 06/17/2017   Total Time spent with patient: 15 minutes  Past Psychiatric History: See admission H&P  Past Medical History:  Past Medical  History:  Diagnosis Date  . Diabetes mellitus without complication (HCC)   . Hypertension   . MI, acute, non ST segment elevation (HCC) 2007   stents x 2 2007 [facility]  . Stroke [facility](HCC) 2017    Past Surgical History:  Procedure Laterality Date  . CORONARY ARTERY BYPASS GRAFT N/A 06/23/2017   Procedure: CORONARY ARTERY BYPASS GRAFTING (CABG) x 3; Using Left Internal Mammary Artery and Right Leg Greater Saphenous Vein harvested endoscopically.;  Surgeon: Kerin PernaVan Trigt, Peter, MD;  [facility];  Service: Open Heart Surgery;  Laterality: N/A;  . LEFT HEART CATH AND CORONARY ANGIOGRAPHY N/A 06/18/2017   Procedure: LEFT HEART CATH AND CORONARY ANGIOGRAPHY;  Surgeon: Lennette BihariKelly, Thomas A, MD;  Location: [facility] INVASIVE CV LAB;  Service: Cardiovascular;  Laterality: N/A;  . TEE WITHOUT CARDIOVERSION N/A 06/23/2017   Procedure: TRANSESOPHAGEAL ECHOCARDIOGRAM (TEE);  Surgeon: Donata ClayVan Trigt, Theron AristaPeter, MD;  [facility];  Service: Open Heart Surgery;  Laterality: N/A;   Family History:  Family History  Problem Relation Age of Onset  . CAD Mother   . Congestive Heart Failure Father    Family Psychiatric  History: See admission H&P Social History:  Social History   Substance and Sexual Activity  Alcohol Use Yes   Comment: 1-2  beers a week     Social History   Substance and Sexual Activity  Drug Use Yes  . Types: Marijuana    Social History   Socioeconomic History  .  Marital status: Single    Spouse name: Not on file  . Number of children: Not on file  . Years of education: Not on file  . Highest education level: Not on file  Occupational History  . Not on file  Social Needs  . Financial resource strain: Not on file  . Food insecurity:    Worry: Not on file    Inability: Not on file  . Transportation needs:    Medical: Not on file    Non-medical: Not on file  Tobacco Use  . Smoking status: Never Smoker  . Smokeless tobacco: Never Used  Substance and Sexual Activity  . Alcohol use: Yes     Comment: 1-2  beers a week  . Drug use: Yes    Types: Marijuana  . Sexual activity: Not on file  Lifestyle  . Physical activity:    Days per week: Not on file    Minutes per session: Not on file  . Stress: Not on file  Relationships  . Social connections:    Talks on phone: Not on file    Gets together: Not on file    Attends religious service: Not on file    Active member of club or organization: Not on file    Attends meetings of clubs or organizations: Not on file    Relationship status: Not on file  Other Topics Concern  . Not on file  Social History Narrative  . Not on file   Additional Social History:                         Sleep: Good  Appetite:  Good  Current Medications: Current Facility-Administered Medications  Medication Dose Route Frequency Provider Last Rate Last Dose  . acetaminophen (TYLENOL) tablet 650 mg  650 mg Oral Q6H PRN Nira Conn A, NP   650 mg at 10/08/17 2126  . alum & mag hydroxide-simeth (MAALOX/MYLANTA) 200-200-20 MG/5ML suspension 30 mL  30 mL Oral Q4H PRN Nira Conn A, NP      . aspirin EC tablet 325 mg  325 mg Oral Daily Nira Conn A, NP   325 mg at 10/09/17 0816  . atorvastatin (LIPITOR) tablet 80 mg  80 mg Oral q1800 Nira Conn A, NP   80 mg at 10/08/17 1717  . furosemide (LASIX) tablet 40 mg  40 mg Oral Daily Nira Conn A, NP   40 mg at 10/09/17 0817  . insulin aspart (novoLOG) injection 0-15 Units  0-15 Units Subcutaneous TID WC Emokpae, Courage, MD   2 Units at 10/09/17 1200  . insulin aspart (novoLOG) injection 0-5 Units  0-5 Units Subcutaneous QHS Shon Hale, MD   3 Units at 09/30/17 2224  . insulin aspart (novoLOG) injection 3 Units  3 Units Subcutaneous TID WC Shon Hale, MD   3 Units at 10/09/17 1201  . insulin glargine (LANTUS) injection 30 Units  30 Units Subcutaneous QHS Shon Hale, MD   30 Units at 10/08/17 2128  . lisinopril (PRINIVIL,ZESTRIL) tablet 5 mg  5 mg Oral Daily Emokpae,  Courage, MD   5 mg at 10/09/17 0816  . magnesium hydroxide (MILK OF MAGNESIA) suspension 30 mL  30 mL Oral Daily PRN Jackelyn Poling, NP      . Melatonin TABS 6 mg  6 mg Oral QHS PRN Nira Conn A, NP      . metFORMIN (GLUCOPHAGE) tablet 1,000 mg  1,000 mg Oral BID WC Nira Conn  A, NP   1,000 mg at 10/09/17 0817  . metoprolol tartrate (LOPRESSOR) tablet 50 mg  50 mg Oral BID Nira Conn A, NP   50 mg at 10/09/17 0816  . naltrexone (DEPADE) tablet 50 mg  50 mg Oral Daily Antonieta Pert, MD   50 mg at 10/09/17 0817  . paliperidone (INVEGA) 24 hr tablet 3 mg  3 mg Oral QHS Antonieta Pert, MD   3 mg at 10/08/17 2128  . potassium chloride SA (K-DUR,KLOR-CON) CR tablet 20 mEq  20 mEq Oral Daily Cobos, Rockey Situ, MD   20 mEq at 10/09/17 0816  . sertraline (ZOLOFT) tablet 75 mg  75 mg Oral Daily Cobos, Rockey Situ, MD   75 mg at 10/09/17 0816  . traZODone (DESYREL) tablet 150 mg  150 mg Oral QHS Fransisca Kaufmann A, NP   150 mg at 10/08/17 2128    Lab Results:  Results for orders placed or performed during the hospital encounter of 09/30/17 (from the past 48 hour(s))  Glucose, capillary     Status: Abnormal   Collection Time: 10/07/17  4:57 PM  Result Value Ref Range   Glucose-Capillary 218 (H) 70 - 99 mg/dL   Comment 1 Notify RN    Comment 2 Document in Chart   Glucose, capillary     Status: Abnormal   Collection Time: 10/07/17  9:30 PM  Result Value Ref Range   Glucose-Capillary 125 (H) 70 - 99 mg/dL  Glucose, capillary     Status: None   Collection Time: 10/08/17  6:10 AM  Result Value Ref Range   Glucose-Capillary 73 70 - 99 mg/dL  Glucose, capillary     Status: Abnormal   Collection Time: 10/08/17 12:10 PM  Result Value Ref Range   Glucose-Capillary 60 (L) 70 - 99 mg/dL  Glucose, capillary     Status: Abnormal   Collection Time: 10/08/17 12:42 PM  Result Value Ref Range   Glucose-Capillary 115 (H) 70 - 99 mg/dL  Glucose, capillary     Status: Abnormal   Collection Time:  10/08/17  5:05 PM  Result Value Ref Range   Glucose-Capillary 251 (H) 70 - 99 mg/dL  Glucose, capillary     Status: Abnormal   Collection Time: 10/08/17  8:56 PM  Result Value Ref Range   Glucose-Capillary 131 (H) 70 - 99 mg/dL   Comment 1 Notify RN    Comment 2 Document in Chart   Glucose, capillary     Status: None   Collection Time: 10/09/17  5:57 AM  Result Value Ref Range   Glucose-Capillary 73 70 - 99 mg/dL   Comment 1 Notify RN    Comment 2 Document in Chart   Glucose, capillary     Status: Abnormal   Collection Time: 10/09/17 11:58 AM  Result Value Ref Range   Glucose-Capillary 136 (H) 70 - 99 mg/dL   Comment 1 Notify RN    Comment 2 Document in Chart     Blood Alcohol level:  Lab Results  Component Value Date   ETH <10 09/29/2017    Metabolic Disorder Labs: Lab Results  Component Value Date   HGBA1C 10.0 (H) 10/01/2017   MPG 240.3 10/01/2017   MPG 165.68 06/17/2017   No results found for: PROLACTIN Lab Results  Component Value Date   CHOL 209 (H) 10/01/2017   TRIG 128 10/01/2017   HDL 32 (L) 10/01/2017   CHOLHDL 6.5 10/01/2017   VLDL 26 10/01/2017   LDLCALC  151 (H) 10/01/2017   LDLCALC 146 (H) 06/19/2017    Physical Findings: AIMS: Facial and Oral Movements Muscles of Facial Expression: None, normal Lips and Perioral Area: None, normal Jaw: None, normal Tongue: None, normal,Extremity Movements Upper (arms, wrists, hands, fingers): None, normal Lower (legs, knees, ankles, toes): None, normal, Trunk Movements Neck, shoulders, hips: None, normal, Overall Severity Severity of abnormal movements (highest score from questions above): None, normal Incapacitation due to abnormal movements: None, normal Patient's awareness of abnormal movements (rate only patient's report): No Awareness, Dental Status Current problems with teeth and/or dentures?: No Does patient usually wear dentures?: No  CIWA:    COWS:     Musculoskeletal: Strength & Muscle Tone:  within normal limits Gait & Station: normal Patient leans: N/A  Psychiatric Specialty Exam: Physical Exam  Nursing note and vitals reviewed. Constitutional: He is oriented to person, place, and time. He appears well-developed and well-nourished.  HENT:  Head: Normocephalic and atraumatic.  Respiratory: Effort normal.  Neurological: He is alert and oriented to person, place, and time.    ROS  Blood pressure 123/69, pulse 67, temperature 98.5 F (36.9 C), temperature source Oral, resp. rate 16, height 6' (1.829 m), weight 83.9 kg, SpO2 100 %.Body mass index is 25.09 kg/m.  General Appearance: Casual  Eye Contact:  Fair  Speech:  Normal Rate  Volume:  Normal  Mood:  Anxious  Affect:  Flat  Thought Process:  Coherent and Descriptions of Associations: Circumstantial  Orientation:  Full (Time, Place, and Person)  Thought Content:  Hallucinations: Auditory  Suicidal Thoughts:  No  Homicidal Thoughts:  No  Memory:  Immediate;   Fair Recent;   Fair Remote;   Fair  Judgement:  Impaired  Insight:  Lacking  Psychomotor Activity:  Increased  Concentration:  Concentration: Fair and Attention Span: Fair  Recall:  [employer] of Knowledge:  Fair  Language:  Fair  Akathisia:  Negative  Handed:  Right  AIMS (if indicated):     Assets:  Desire for Improvement Housing Physical Health Resilience  ADL's:  Intact  Cognition:  WNL  Sleep:  Number of Hours: 6     Treatment Plan Summary: Daily contact with patient to assess and evaluate symptoms and progress in treatment, Medication management and Plan : Patient is seen and examined.  Patient is a 52 year old male with the above-stated past psychiatric history was seen in follow-up.  #1 schizoaffective disorder-continue oral paliperidone 3 mg p.o. nightly.  The patient received his paliperidone long-acting injection 156 mg 3 days ago.  He will also continue on sertraline 75 mg p.o. daily.  #2 diabetes mellitus-currently stable with sliding  scale as well as NovoLog and Lantus.  #3 alcohol dependence-he was receiving the naltrexone injection, and continues on the oral naltrexone 50 mg p.o. daily.  No change in that.  #4 hypertension-continue metoprolol 50 mg p.o. twice daily.  Continue lisinopril 5 mg p.o. daily.  #5 disposition-social work continues to attempt to find a 30-day rehabilitation facility for the patient.  Antonieta Pert, MD 10/09/2017, 1:47 PM

## 2017-10-09 NOTE — Progress Notes (Signed)
DAR NOTE: Patient presents with anxious affect and depressed mood. Pt stays in the bed most of the day, endorsing passive SI with a plan to OD on sleep medication when he leaves here. Pt stated he has nothing to live for, his children does want anything to do with him. Pt is diabetic, his CBG at lunch time 60 mg/dl, insulin with held.  Reports fair sleep last night, fair appetite, low energy and poor  Concentration. Denies pain, auditory and visual hallucinations.  Rates depression at 10, hopelessness at 10, and anxiety at 10.  Maintained on routine safety checks.  Medications given as prescribed.  Support and encouragement offered as needed. States goal for today is " is feeling better." Will continue to monitor

## 2017-10-09 NOTE — Tx Team (Signed)
Interdisciplinary Treatment and Diagnostic Plan Update  10/09/2017 Time of Session: 1610RU TERYN GUST Sr. MRN: [id number]  Principal Diagnosis: MDD, recurrent severe without psychosis   Secondary Diagnoses: Principal Problem:   Schizoaffective disorder (HCC)   Current Medications:  Current Facility-Administered Medications  Medication Dose Route Frequency Provider Last Rate Last Dose  . acetaminophen (TYLENOL) tablet 650 mg  650 mg Oral Q6H PRN Nira Conn A, NP   650 mg at 10/08/17 2126  . alum & mag hydroxide-simeth (MAALOX/MYLANTA) 200-200-20 MG/5ML suspension 30 mL  30 mL Oral Q4H PRN Nira Conn A, NP      . aspirin EC tablet 325 mg  325 mg Oral Daily Nira Conn A, NP   325 mg at 10/09/17 0816  . atorvastatin (LIPITOR) tablet 80 mg  80 mg Oral q1800 Nira Conn A, NP   80 mg at 10/08/17 1717  . furosemide (LASIX) tablet 40 mg  40 mg Oral Daily Nira Conn A, NP   40 mg at 10/09/17 0817  . insulin aspart (novoLOG) injection 0-15 Units  0-15 Units Subcutaneous TID WC Shon Hale, MD   8 Units at 10/08/17 1721  . insulin aspart (novoLOG) injection 0-5 Units  0-5 Units Subcutaneous QHS Shon Hale, MD   3 Units at 09/30/17 2224  . insulin aspart (novoLOG) injection 3 Units  3 Units Subcutaneous TID WC Shon Hale, MD   3 Units at 10/08/17 1719  . insulin glargine (LANTUS) injection 30 Units  30 Units Subcutaneous QHS Shon Hale, MD   30 Units at 10/08/17 2128  . lisinopril (PRINIVIL,ZESTRIL) tablet 5 mg  5 mg Oral Daily Emokpae, Courage, MD   5 mg at 10/09/17 0816  . magnesium hydroxide (MILK OF MAGNESIA) suspension 30 mL  30 mL Oral Daily PRN Jackelyn Poling, NP      . Melatonin TABS 6 mg  6 mg Oral QHS PRN Nira Conn A, NP      . metFORMIN (GLUCOPHAGE) tablet 1,000 mg  1,000 mg Oral BID WC Nira Conn A, NP   1,000 mg at 10/09/17 0817  . metoprolol tartrate (LOPRESSOR) tablet 50 mg  50 mg Oral BID Nira Conn A, NP   50 mg at 10/09/17 0816  .  naltrexone (DEPADE) tablet 50 mg  50 mg Oral Daily Antonieta Pert, MD   50 mg at 10/09/17 0817  . paliperidone (INVEGA) 24 hr tablet 3 mg  3 mg Oral QHS Antonieta Pert, MD   3 mg at 10/08/17 2128  . potassium chloride SA (K-DUR,KLOR-CON) CR tablet 20 mEq  20 mEq Oral Daily Cobos, Rockey Situ, MD   20 mEq at 10/09/17 0816  . sertraline (ZOLOFT) tablet 75 mg  75 mg Oral Daily Cobos, Rockey Situ, MD   75 mg at 10/09/17 0816  . traZODone (DESYREL) tablet 150 mg  150 mg Oral QHS Fransisca Kaufmann A, NP   150 mg at 10/08/17 2128   PTA Medications: Medications Prior to Admission  Medication Sig Dispense Refill Last Dose  . aspirin EC 325 MG EC tablet Take 1 tablet (325 mg total) by mouth daily. 30 tablet 0 09/29/2017 at 0900  . atorvastatin (LIPITOR) 80 MG tablet Take 1 tablet (80 mg total) by mouth daily at 6 PM. 30 tablet 1 Past Week at Unknown time  . furosemide (LASIX) 40 MG tablet Take 1 tablet (40 mg total) by mouth daily. 7 tablet 0 09/29/2017 at Unknown time  . glipiZIDE (GLUCOTROL) 5 MG tablet Take 5  mg by mouth daily before breakfast.   09/29/2017 at Unknown time  . insulin glargine (LANTUS) 100 UNIT/ML injection Inject 30 Units into the skin at bedtime.   09/27/2017  . Melatonin 3 MG CAPS Take 6 mg by mouth at bedtime as needed (for sleep).   09/27/2017  . metFORMIN (GLUCOPHAGE) 1000 MG tablet Take 1,000 mg by mouth 2 (two) times daily with a meal.   09/28/2017 at Unknown time  . metoprolol tartrate (LOPRESSOR) 50 MG tablet Take 1 tablet (50 mg total) by mouth 2 (two) times daily. 60 tablet 1 09/29/2017 at 0900  . Naltrexone 380 MG SUSR Inject 380 mg into the muscle every 28 (twenty-eight) days.   09/10/2017  . paliperidone (INVEGA SUSTENNA) 156 MG/ML SUSY injection Inject 156 mg into the muscle every 28 (twenty-eight) days.   09/10/2017  . potassium chloride 20 MEQ TBCR Take 20 mEq by mouth daily. 7 tablet 0 09/27/2017  . traZODone (DESYREL) 100 MG tablet Take 100 mg by mouth at bedtime.   09/28/2017 at  Unknown time    Patient Stressors: Health problems Substance abuse  Patient Strengths: Capable of independent living Motivation for treatment/growth Supportive family/friends  Treatment Modalities: Medication Management, Group therapy, Case management,  1 to 1 session with clinician, Psychoeducation, Recreational therapy.   Physician Treatment Plan for Primary Diagnosis: MDD, recurrent severe without psychosis   Medication Management: Evaluate patient's response, side effects, and tolerance of medication regimen.  Therapeutic Interventions: 1 to 1 sessions, Unit Group sessions and Medication administration.  Evaluation of Outcomes: Progressing  Physician Treatment Plan for Secondary Diagnosis: Principal Problem:   Schizoaffective disorder (HCC)   Medication Management: Evaluate patient's response, side effects, and tolerance of medication regimen.  Therapeutic Interventions: 1 to 1 sessions, Unit Group sessions and Medication administration.  Evaluation of Outcomes: Progressing   RN Treatment Plan for Primary Diagnosis: MDD, recurrent severe without psychosis  Long Term Goal(s): Knowledge of disease and therapeutic regimen to maintain health will improve  Short Term Goals: Ability to remain free from injury will improve, Ability to verbalize frustration and anger appropriately will improve, Ability to disclose and discuss suicidal ideas and Ability to identify and develop effective coping behaviors will improve  Medication Management: RN will administer medications as ordered by provider, will assess and evaluate patient's response and provide education to patient for prescribed medication. RN will report any adverse and/or side effects to prescribing provider.  Therapeutic Interventions: 1 on 1 counseling sessions, Psychoeducation, Medication administration, Evaluate responses to treatment, Monitor vital signs and CBGs as ordered, Perform/monitor CIWA, COWS, AIMS and Fall  Risk screenings as ordered, Perform wound care treatments as ordered.  Evaluation of Outcomes: Progressing   LCSW Treatment Plan for Primary Diagnosis: MDD, recurrent severe without psychosis  Long Term Goal(s): Safe transition to appropriate next level of care at discharge, Engage patient in therapeutic group addressing interpersonal concerns.  Short Term Goals: Engage patient in aftercare planning with referrals and resources, Facilitate patient progression through stages of change regarding substance use diagnoses and concerns and Identify triggers associated with mental health/substance abuse issues  Therapeutic Interventions: Assess for all discharge needs, 1 to 1 time with Social worker, Explore available resources and support systems, Assess for adequacy in community support network, Educate family and significant other(s) on suicide prevention, Complete Psychosocial Assessment, Interpersonal group therapy.  Evaluation of Outcomes: Progressing   Progress in Treatment: Attending groups: Yes --intermittently  Participating in groups: Yes  Taking medication as prescribed: Yes. Toleration medication: Yes. Family/Significant  other contact made: Yes, with the patient's sister.   Patient understands diagnosis: Yes. Pt minimizing substance use. Pt denies recent drug or alcohol abuse--UDS pos for cocaine.  Discussing patient identified problems/goals with staff: Yes. Medical problems stabilized or resolved: Yes. Denies suicidal/homicidal ideation: Yes. Issues/concerns per patient self-inventory: No. Other: n/a   New problem(s) identified: No, Describe:  n/a  New Short Term/Long Term Goal(s): detox, medication management for mood stabilization; elimination of SI thoughts; development of comprehensive mental wellness/sobriety plan.   Patient Goals:  "To get help for my depression and lack of energy."   Discharge Plan or Barriers: CSW assessing for appropriate referrals--pt has follow-up  at [facility] for 9/19 and is now requesting [facility] screening.  [facility] pamphlet, Mobile Crisis information, and AA/NA information provided to patient for additional community support and resources.   Reason for Continuation of Hospitalization: Anxiety Depression Medication stabilization Withdrawal symptoms  SI    Estimated Length of Stay: Tuesday, 10/13/17  Attendees: Patient: 10/09/2017 8:58 AM  Physician: Dr. Jola Babinski MD; Dr. Altamese Mi [place] MD 10/09/2017 8:58 AM  Nursing: Arlyss Repress RN; Jane RN 10/09/2017 8:58 AM  RN Care Manager:X 10/09/2017 8:58 AM  Social Worker: Corrie Mckusick LCSW;  10/09/2017 8:58 AM  Recreational Therapist: x 10/09/2017 8:58 AM  Other: Armandina Stammer NP 10/09/2017 8:58 AM  Other:  10/09/2017 8:58 AM  Other: 10/09/2017 8:58 AM    Scribe for Treatment Team: Rona Ravens, LCSW 10/09/2017 8:58 AM

## 2017-10-10 DIAGNOSIS — F250 Schizoaffective disorder, bipolar type: Principal | ICD-10-CM

## 2017-10-10 DIAGNOSIS — F419 Anxiety disorder, unspecified: Secondary | ICD-10-CM

## 2017-10-10 DIAGNOSIS — F1210 Cannabis abuse, uncomplicated: Secondary | ICD-10-CM

## 2017-10-10 DIAGNOSIS — F1410 Cocaine abuse, uncomplicated: Secondary | ICD-10-CM | POA: Diagnosis present

## 2017-10-10 LAB — GLUCOSE, CAPILLARY
GLUCOSE-CAPILLARY: 188 mg/dL — AB (ref 70–99)
Glucose-Capillary: 70 mg/dL (ref 70–99)
Glucose-Capillary: 168 mg/dL — ABNORMAL HIGH (ref 70–99)
Glucose-Capillary: 180 mg/dL — ABNORMAL HIGH (ref 70–99)

## 2017-10-10 NOTE — [facility] Group Notes (Signed)
[facility] Group Notes:  (Nursing)  Date:  10/10/2017  Time: 1:15 PM Type of Therapy:  Nurse Education  Participation Level:  Active  Participation Quality:  Appropriate  Affect:  Appropriate  Cognitive:  Appropriate  Insight:  Appropriate  Engagement in Group:  Engaged  Modes of Intervention:  Discussion and Education  Summary of Progress/Problems: Nurse-led group discussed anger management  Shela NevinValerie S Danaisha Celli 10/10/2017, 4:10 PM

## 2017-10-10 NOTE — Progress Notes (Signed)
Writer spoke with patient who has been in bed resting tonight. He did not attend group tonight. He reports that he feels fine but remained in his bed. Support given and safety maintained on unit with 15 min checks.

## 2017-10-10 NOTE — Progress Notes (Signed)
[facility] MD Progress Note  10/10/2017 3:58 PM Marcus Cummings Sr.  MRN:  [id number]   Subjective:  Marcus Cummings 52 year old AA male with Schizoaffective disorder and Cocaine Abuse.  On encounter today, he reported dong "okay".  He slept well but reported decreased appetite.  Patient denied suicidal or homicidal ideations.  Felt his depressive symptoms had improved and looking forward to discharge.  He plans to return to his apartment.     52, divorced, lives alone, has two adult children, on [place] disability ( served in [employer]) who presents to the unit with a psychiatric history of Bipolar Disorder, alcohol use disorder, PTSD and depression. Patient initially presented tot he ED with complaints of chest pain. While there, he endorsed that he was having suicidal thoughts. He was medically cleared and transferred tot he [facility] unit. Patient acknowledges his SI prior to admission and at current. He reports a history of chronic depression and reports a couple of days prior to his admission, he overdosed on Melatonin. He states he took a " handful" and did not seek treatment at that time. He describes depressive symptoms anhedonia, decreased energy, decreased sleep, poor appetite. Denies psychotic symptoms although does endorse he has head voices int he past. Reports main stressor as chronic medical conditions (CVA in 2017, had bypass surgery in May 2019).  Principal Problem: Schizoaffective disorder (HCC) Diagnosis:   Patient Active Problem List   Diagnosis Date Noted  . Cocaine abuse (HCC) [F14.10] 10/10/2017    Priority: High  . Schizoaffective disorder (HCC) [F25.9] 09/30/2017    Priority: High  . 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 Essential hypertension [I10]   . Dyslipidemia [E78.5]   . Chest pain on exertion [R07.9]   . Stable angina (HCC) [I20.8] 06/17/2017   Total Time spent with patient: 20 minutes  Past Psychiatric History: See H&P  Past Medical History:  Past Medical History:  Diagnosis Date   . Diabetes mellitus without complication (HCC)   . Hypertension   . MI, acute, non ST segment elevation (HCC) 2007   stents x 2 2007 [facility] [place])  . Stroke [facility]) 2017    Past Surgical History:  Procedure Laterality Date  . CORONARY ARTERY BYPASS GRAFT N/A 06/23/2017   Procedure: CORONARY ARTERY BYPASS GRAFTING (CABG) x 3; Using Left Internal Mammary Artery and Right Leg Greater Saphenous Vein harvested endoscopically.;  Surgeon: Kerin Perna, MD;  Location: [facility] OR;  Service: Open Heart Surgery;  Laterality: N/A;  . LEFT HEART CATH AND CORONARY ANGIOGRAPHY N/A 06/18/2017   Procedure: LEFT HEART CATH AND CORONARY ANGIOGRAPHY;  Surgeon: Lennette Bihari, MD;  Location: [facility] INVASIVE CV LAB;  Service: Cardiovascular;  Laterality: N/A;  . TEE WITHOUT CARDIOVERSION N/A 06/23/2017   Procedure: TRANSESOPHAGEAL ECHOCARDIOGRAM (TEE);  Surgeon: Donata Clay, Theron Arista, MD;  Location: [facility] OR;  Service: Open Heart Surgery;  Laterality: N/A;   Family History:  Family History  Problem Relation Age of Onset  . CAD Mother   . Congestive Heart Failure Father    Family Psychiatric  History: See H&P Social History:  Social History   Substance and Sexual Activity  Alcohol Use Yes   Comment: 1-2  beers a week     Social History   Substance and Sexual Activity  Drug Use Yes  . Types: Marijuana    Social History   Socioeconomic History  . Marital status: Single    Spouse name: Not on file  . Number of children: Not on  file  . Years of education: Not on file  . Highest education level: Not on file  Occupational History  . Not on file  Social Needs  . Financial resource strain: Not on file  . Food insecurity:    Worry: Not on file    Inability: Not on file  . Transportation needs:    Medical: Not on file    Non-medical: Not on file  Tobacco Use  . Smoking status: Never Smoker  . Smokeless tobacco: Never Used  Substance and Sexual Activity  . Alcohol use: Yes    Comment: 1-2  beers a week  .  Drug use: Yes    Types: Marijuana  . Sexual activity: Not on file  Lifestyle  . Physical activity:    Days per week: Not on file    Minutes per session: Not on file  . Stress: Not on file  Relationships  . Social connections:    Talks on phone: Not on file    Gets together: Not on file    Attends religious service: Not on file    Active member of club or organization: Not on file    Attends meetings of clubs or organizations: Not on file    Relationship status: Not on file  Other Topics Concern  . Not on file  Social History Narrative  . Not on file   Additional Social History:                         Sleep: Good  Appetite:  Fair  Current Medications: Current Facility-Administered Medications  Medication Dose Route Frequency Provider Last Rate Last Dose  . acetaminophen (TYLENOL) tablet 650 mg  650 mg Oral Q6H PRN Nira Conn A, NP   650 mg at 10/10/17 0928  . alum & mag hydroxide-simeth (MAALOX/MYLANTA) 200-200-20 MG/5ML suspension 30 mL  30 mL Oral Q4H PRN Nira Conn A, NP      . aspirin EC tablet 325 mg  325 mg Oral Daily Nira Conn A, NP   325 mg at 10/10/17 0929  . atorvastatin (LIPITOR) tablet 80 mg  80 mg Oral q1800 Nira Conn A, NP   80 mg at 10/09/17 1715  . furosemide (LASIX) tablet 40 mg  40 mg Oral Daily Nira Conn A, NP   40 mg at 10/10/17 0928  . insulin aspart (novoLOG) injection 0-15 Units  0-15 Units Subcutaneous TID WC Shon Hale, MD   3 Units at 10/10/17 1209  . insulin aspart (novoLOG) injection 0-5 Units  0-5 Units Subcutaneous QHS Shon Hale, MD   Stopped at 10/09/17 2205  . insulin aspart (novoLOG) injection 3 Units  3 Units Subcutaneous TID WC Shon Hale, MD   3 Units at 10/10/17 1208  . insulin glargine (LANTUS) injection 30 Units  30 Units Subcutaneous QHS Shon Hale, MD   30 Units at 10/09/17 2215  . lisinopril (PRINIVIL,ZESTRIL) tablet 5 mg  5 mg Oral Daily Emokpae, Courage, MD   5 mg at 10/10/17 0929  .  magnesium hydroxide (MILK OF MAGNESIA) suspension 30 mL  30 mL Oral Daily PRN Jackelyn Poling, NP      . Melatonin TABS 6 mg  6 mg Oral QHS PRN Nira Conn A, NP      . metFORMIN (GLUCOPHAGE) tablet 1,000 mg  1,000 mg Oral BID WC Nira Conn A, NP   1,000 mg at 10/10/17 0929  . metoprolol tartrate (LOPRESSOR) tablet 50 mg  50  mg Oral BID Nira Conn A, NP   50 mg at 10/10/17 1610  . naltrexone (DEPADE) tablet 50 mg  50 mg Oral Daily Antonieta Pert, MD   50 mg at 10/10/17 9604  . paliperidone (INVEGA) 24 hr tablet 3 mg  3 mg Oral QHS Antonieta Pert, MD   3 mg at 10/09/17 2213  . potassium chloride SA (K-DUR,KLOR-CON) CR tablet 20 mEq  20 mEq Oral Daily Cobos, Rockey Situ, MD   20 mEq at 10/10/17 0928  . sertraline (ZOLOFT) tablet 75 mg  75 mg Oral Daily Cobos, Rockey Situ, MD   75 mg at 10/10/17 0928  . traZODone (DESYREL) tablet 150 mg  150 mg Oral QHS Fransisca Kaufmann A, NP   150 mg at 10/09/17 2213    Lab Results:  Results for orders placed or performed during the hospital encounter of 09/30/17 (from the past 48 hour(s))  Glucose, capillary     Status: Abnormal   Collection Time: 10/08/17  5:05 PM  Result Value Ref Range   Glucose-Capillary 251 (H) 70 - 99 mg/dL  Glucose, capillary     Status: Abnormal   Collection Time: 10/08/17  8:56 PM  Result Value Ref Range   Glucose-Capillary 131 (H) 70 - 99 mg/dL   Comment 1 Notify RN    Comment 2 Document in Chart   Glucose, capillary     Status: None   Collection Time: 10/09/17  5:57 AM  Result Value Ref Range   Glucose-Capillary 73 70 - 99 mg/dL   Comment 1 Notify RN    Comment 2 Document in Chart   Glucose, capillary     Status: Abnormal   Collection Time: 10/09/17 11:58 AM  Result Value Ref Range   Glucose-Capillary 136 (H) 70 - 99 mg/dL   Comment 1 Notify RN    Comment 2 Document in Chart   Glucose, capillary     Status: Abnormal   Collection Time: 10/09/17  5:05 PM  Result Value Ref Range   Glucose-Capillary 111 (H) 70 - 99  mg/dL  Glucose, capillary     Status: Abnormal   Collection Time: 10/09/17  8:54 PM  Result Value Ref Range   Glucose-Capillary 127 (H) 70 - 99 mg/dL   Comment 1 Notify RN    Comment 2 Document in Chart   Glucose, capillary     Status: None   Collection Time: 10/10/17  5:49 AM  Result Value Ref Range   Glucose-Capillary 70 70 - 99 mg/dL   Comment 1 Notify RN    Comment 2 Document in Chart   Glucose, capillary     Status: Abnormal   Collection Time: 10/10/17 11:43 AM  Result Value Ref Range   Glucose-Capillary 168 (H) 70 - 99 mg/dL   Comment 1 Notify RN    Comment 2 Document in Chart     Blood Alcohol level:  Lab Results  Component Value Date   ETH <10 09/29/2017    Metabolic Disorder Labs: Lab Results  Component Value Date   HGBA1C 10.0 (H) 10/01/2017   MPG 240.3 10/01/2017   MPG 165.68 06/17/2017   No results found for: PROLACTIN Lab Results  Component Value Date   CHOL 209 (H) 10/01/2017   TRIG 128 10/01/2017   HDL 32 (L) 10/01/2017   CHOLHDL 6.5 10/01/2017   VLDL 26 10/01/2017   LDLCALC 151 (H) 10/01/2017   LDLCALC 146 (H) 06/19/2017    Physical Findings: AIMS: Facial and Oral Movements  Muscles of Facial Expression: None, normal Lips and Perioral Area: None, normal Jaw: None, normal Tongue: None, normal,Extremity Movements Upper (arms, wrists, hands, fingers): None, normal Lower (legs, knees, ankles, toes): None, normal, Trunk Movements Neck, shoulders, hips: None, normal, Overall Severity Severity of abnormal movements (highest score from questions above): None, normal Incapacitation due to abnormal movements: None, normal Patient's awareness of abnormal movements (rate only patient's report): No Awareness, Dental Status Current problems with teeth and/or dentures?: No Does patient usually wear dentures?: No  CIWA:    COWS:     Musculoskeletal: Strength & Muscle Tone: within normal limits Gait & Station: normal Patient leans: N/A  Psychiatric  Specialty Exam: Physical Exam  Nursing note and vitals reviewed. Constitutional: He is oriented to person, place, and time. He appears well-developed and well-nourished.  HENT:  Head: Normocephalic.  Neck: Normal range of motion.  Cardiovascular: Normal rate.  Respiratory: Effort normal.  Musculoskeletal: Normal range of motion.  Neurological: He is alert and oriented to person, place, and time.  Skin: Skin is warm.  Psychiatric: His speech is normal. Judgment and thought content normal. His mood appears anxious. He is slowed. Cognition and memory are normal. He exhibits a depressed mood.    Review of Systems  Constitutional: Negative.   HENT: Negative.   Eyes: Negative.   Respiratory: Negative.   Cardiovascular: Negative.   Gastrointestinal: Negative.   Genitourinary: Negative.   Musculoskeletal: Negative.   Skin: Negative.   Neurological: Negative.   Endo/Heme/Allergies: Negative.   Psychiatric/Behavioral: Positive for depression and substance abuse. Negative for hallucinations. The patient is nervous/anxious.     Blood pressure 132/69, pulse 63, temperature 98.5 F (36.9 C), temperature source Oral, resp. rate 18, height 6' (1.829 m), weight 83.9 kg, SpO2 100 %.Body mass index is 25.09 kg/m.  General Appearance: Appropriate  Eye Contact: Good  Speech:  Slow  Volume:  Decreased  Mood:  Depressed  Affect:  Flat  Thought Process:  Linear and Descriptions of Associations: Intact  Orientation:  Full (Time, Place, and Person)  Thought Content:  WDL  Suicidal Thoughts: Denied  Homicidal Thoughts: Denied  Memory:  Immediate;   Good Recent;   Good Remote;   Good  Judgement: Good  Insight: Good  Psychomotor Activity: Hypoactive  Concentration:  Concentration: Good and Attention Span: Good  Recall:  Good  Fund of Knowledge:  Good  Language:  Good  Akathisia:  No  Handed:  Right  AIMS (if indicated):     Assets:  Communication Skills Desire for Improvement Financial  Resources/Insurance Resilience Social Support Transportation  ADL's:  Intact  Cognition:  WNL  Sleep:  Number of Hours: 5.75   Problems addressed Schizoaffective disorder  Treatment Plan Summary: Daily contact with patient to assess and evaluate symptoms and progress in treatment, Medication management and Plan is to:  Schizoaffective disorder, bipolar type: Continue Invega Sustenna 156 mg IM injection every 28 days last dose was 10/07/2017 Continue in [place] 3 mg p.o. daily for mood stability Continue Zoloft 75 mg p.o. daily for mood stability continue   Insomnia Continue Trazodone 150 mg nightly for mood stability and sleep  Substance abuse: Continue naltrexone 50 mg p.o. Daily  Medical issues: -Continue insulin, lisinopril, metoprolol, aspirin, Lipitor and glucophage  Encourage group therapy participation  [facility]  Nanine MeansLORD, Faviola Klare, NP 10/10/2017, 3:58 PMPatient ID: Marcus BatheGregory L Susi Sr., male   DOB: 1965-11-24, 52 y.o.   MRN: [id number]

## 2017-10-10 NOTE — [facility] Group Notes (Signed)
[facility] Group Notes:  (Nursing/MHT/Case Management/Adjunct)  Date:  10/10/2017  Time:  12:29 PM  Type of Therapy:  Psychoeducational Skills  Participation Level:  Active  Participation Quality:  Appropriate  Affect:  Appropriate  Cognitive:  Appropriate  Insight:  Appropriate  Engagement in Group:  Engaged  Modes of Intervention:  Problem-solving  Summary of Progress/Problems; Pt talked about their life experinces  [name] 10/10/2017, 12:29 PM

## 2017-10-10 NOTE — Progress Notes (Signed)
D: Patient observed visible in dayroom this evening. Interactive with peers. Patient states, "sure I'm suicidal. That's everyday. It never changes." Patient's affect and mood incongruent with his report. Denies pain, physical complaints.   A: Medicated per orders, no prns requested or required. Lantus given however hs coverage held per parameters. Medication education provided. Level III obs in place for safety. Emotional support offered. Patient encouraged to complete Suicide Safety Plan before discharge. Encouraged to attend and participate in unit programming. Challenged patient to consider what he can do to/what plans he can make to change his reported suicidal thinking.   R: Patient verbalizes understanding of POC. Acknowledged that he has survived prior attempts and "maybe there is something to that." Patient remains minimally invested. Patient denies HI/AVH and verbally contracts for safety. He remains safe on level III obs. Will continue to monitor throughout the night.

## 2017-10-10 NOTE — [facility] Group Notes (Signed)
[facility] Group Notes:  (Nursing/MHT/Case Management/Adjunct)  Date:  10/10/2017  Time:  9:19 AM  Type of Therapy:  Orientation/Goals group  Participation Level:  Did Not Attend  Participation Quality:  Did Not Attend  Affect:  Did Not Attend  Cognitive:  Did Not Attend  Insight:  None  Engagement in Group:  Did Not Attend  Modes of Intervention:  Did Not Attend  Summary of Progress/Problems: Pt did not attend patient self inventory group/orientation group.   Jacquelyne BalintForrest, Evora Schechter Shanta 10/10/2017, 9:19 AM

## 2017-10-10 NOTE — Progress Notes (Signed)
Patient ID: Marcus BatheGregory L Ernest Sr., male   DOB: [birth year], 52 y.o.   MRN: [id number]    D: Pt has been very flat and depressed on the unit today. Pt attended all groups and engaged in treatment. Pt took all medication as prescribed by the nurse, no issues or concerns noted. Pt reported verbally that he was negative SI/HI, no AH/VH noted. Pt then noted on his patient self inventory sheet that he was positive SI, this writer followed up he was able to contract for safety. Pt reported that his depression, anxiety, and hopelessness was a 10. Pt reported that his goal for today was to feel better. Pt reported being negative HI, no AH/VH noted. A: 15 min checks continued for patient safety. R: Pt safety maintained.

## 2017-10-10 NOTE — [facility] Group Notes (Signed)
LCSW Group Therapy Note  10/10/2017   10:00-11:00am   Type of Therapy and Topic:  Group Therapy: Anger Cues and Responses  Participation Level:  Did Not Attend   Description of Group:   In this group, patients learned how to recognize the physical, cognitive, emotional, and behavioral responses they have to anger-provoking situations.  They identified a recent time they became angry and how they reacted.  They analyzed how their reaction was possibly beneficial and how it was possibly unhelpful.  The group discussed a variety of healthier coping skills that could help with such a situation in the future.  Deep breathing was practiced briefly.  Therapeutic Goals: 1. Patients will remember their last incident of anger and how they felt emotionally and physically, what their thoughts were at the time, and how they behaved. 2. Patients will identify how their behavior at that time worked for them, as well as how it worked against them. 3. Patients will explore possible new behaviors to use in future anger situations. 4. Patients will learn that anger itself is normal and cannot be eliminated, and that healthier reactions can assist with resolving conflict rather than worsening situations.  Summary of Patient Progress:  Did not attend  Therapeutic Modalities:   Cognitive Behavioral Therapy  Yaire Kreher D Darryl Blumenstein    

## 2017-10-10 NOTE — Plan of Care (Signed)
  Problem: Education: Goal: Knowledge of [facility] Education information/materials will improve Outcome: Progressing Goal: Verbalization of understanding the information provided will improve Outcome: Progressing   

## 2017-10-11 DIAGNOSIS — F1410 Cocaine abuse, uncomplicated: Secondary | ICD-10-CM

## 2017-10-11 DIAGNOSIS — F1290 Cannabis use, unspecified, uncomplicated: Secondary | ICD-10-CM

## 2017-10-11 LAB — GLUCOSE, CAPILLARY
GLUCOSE-CAPILLARY: 199 mg/dL — AB (ref 70–99)
GLUCOSE-CAPILLARY: 135 mg/dL — AB (ref 70–99)
Glucose-Capillary: 72 mg/dL (ref 70–99)
Glucose-Capillary: 87 mg/dL (ref 70–99)

## 2017-10-11 LAB — BASIC METABOLIC PANEL
Anion gap: 11 (ref 5–15)
BUN: 15 mg/dL (ref 6–20)
CHLORIDE: 99 mmol/L (ref 98–111)
CO2: 26 mmol/L (ref 22–32)
Calcium: 8.8 mg/dL — ABNORMAL LOW (ref 8.9–10.3)
Creatinine, Ser: 1.17 mg/dL (ref 0.61–1.24)
GFR calc Af Amer: >60 mL/min (ref >60)
GFR calc non Af Amer: >60 mL/min (ref >60)
Glucose, Bld: 218 mg/dL — ABNORMAL HIGH (ref 70–99)
POTASSIUM: 4.2 mmol/L (ref 3.5–5.1)
SODIUM: 136 mmol/L (ref 135–145)

## 2017-10-11 NOTE — [facility] Group Notes (Signed)
[facility] LCSW Group Therapy Note  Date/Time:  10/11/2017 9:00-10:00 or 10:00-11:00AM  Type of Therapy and Topic:  Group Therapy:  Healthy and Unhealthy Supports  Participation Level:  Did Not Attend   Description of Group:  Patients in this group were introduced to the idea of adding a variety of healthy supports to address the various needs in their lives.Patients discussed what additional healthy supports could be helpful in their recovery and wellness after discharge in order to prevent future hospitalizations.   An emphasis was placed on using counselor, doctor, therapy groups, 12-step groups, and problem-specific support groups to expand supports.  They also worked as a group on developing a specific plan for several patients to deal with unhealthy supports through boundary-setting, psychoeducation with loved ones, and even termination of relationships.   Therapeutic Goals:   1)  discuss importance of adding supports to stay well once out of the hospital  2)  compare healthy versus unhealthy supports and identify some examples of each  3)  generate ideas and descriptions of healthy supports that can be added  4)  offer mutual support about how to address unhealthy supports  5)  encourage active participation in and adherence to discharge plan    Summary of Patient Progress:   Did not attend Therapeutic Modalities:   Motivational Interviewing Brief Solution-Focused Therapy  Dashayla Theissen D Montey Ebel         

## 2017-10-11 NOTE — [facility] Group Notes (Signed)
Pt was invited but did not attend orientation/goals group. 

## 2017-10-11 NOTE — Progress Notes (Signed)
[facility]  10/11/2017 1:05 PM Marcus BatheGregory L Doby Sr.  MRN:  [id number] Subjective: Patient is seen and examined.  Patient is a 52 year old male with a reported past psychiatric history significant for schizoaffective disorder, history of alcohol use disorder, alcohol dependence and reportedly posttraumatic stress disorder.  Patient is essentially unchanged.  We are waiting to see if he is excepted into a residential substance abuse program.  We discussed today the fact that the way to get into a facility is over a week that we would have to discharge him home probably tomorrow.  He understands that.  We have tried to get across to him that office resources from the [employer] are already in place, and that if he needed housing it would be [facility] [employer] [facility].  He already has housing provided for him by the [employer].  With regard to his medical illness his blood sugars been stable.  It was 87 this morning.  His vital signs are stable, he is afebrile. Principal Problem: Schizoaffective disorder (HCC) Diagnosis:   Patient Active Problem List   Diagnosis Date Noted  . Cocaine abuse (HCC) [F14.10] 10/10/2017  . Schizoaffective disorder (HCC) [F25.9] 09/30/2017  . 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 Essential hypertension [I10]   . Dyslipidemia [E78.5]   . Chest pain on exertion [R07.9]   . Stable angina (HCC) [I20.8] 06/17/2017   Total Time spent with patient: 15 minutes  Past Psychiatric History: See admission H&P  Past Medical History:  Past Medical History:  Diagnosis Date  . Diabetes mellitus without complication (HCC)   . Hypertension   . MI, acute, non ST segment elevation (HCC) 2007   stents x 2 2007 [facility]  . Stroke [facility](HCC) 2017    Past Surgical History:  Procedure Laterality Date  . CORONARY ARTERY BYPASS GRAFT N/A 06/23/2017   Procedure: CORONARY ARTERY BYPASS GRAFTING (CABG) x 3; Using Left Internal Mammary Artery and Right Leg Greater  Saphenous Vein harvested endoscopically.;  Surgeon: Kerin PernaVan Trigt, Peter, MD;  [facility];  Service: Open Heart Surgery;  Laterality: N/A;  . LEFT HEART CATH AND CORONARY ANGIOGRAPHY N/A 06/18/2017   Procedure: LEFT HEART CATH AND CORONARY ANGIOGRAPHY;  Surgeon: Lennette BihariKelly, Thomas A, MD;  Location: [facility] INVASIVE CV LAB;  Service: Cardiovascular;  Laterality: N/A;  . TEE WITHOUT CARDIOVERSION N/A 06/23/2017   Procedure: TRANSESOPHAGEAL ECHOCARDIOGRAM (TEE);  Surgeon: Donata ClayVan Trigt, Theron AristaPeter, MD;  [facility];  Service: Open Heart Surgery;  Laterality: N/A;   Family History:  Family History  Problem Relation Age of Onset  . CAD Mother   . Congestive Heart Failure Father    Family Psychiatric  History: See admission H&P Social History:  Social History   Substance and Sexual Activity  Alcohol Use Yes   Comment: 1-2  beers a week     Social History   Substance and Sexual Activity  Drug Use Yes  . Types: Marijuana    Social History   Socioeconomic History  . Marital status: Single    Spouse name: Not on file  . Number of children: Not on file  . Years of education: Not on file  . Highest education level: Not on file  Occupational History  . Not on file  Social Needs  . Financial resource strain: Not on file  . Food insecurity:    Worry: Not on file    Inability: Not on file  . Transportation needs:    Medical: Not on  file    Non-medical: Not on file  Tobacco Use  . Smoking status: Never Smoker  . Smokeless tobacco: Never Used  Substance and Sexual Activity  . Alcohol use: Yes    Comment: 1-2  beers a week  . Drug use: Yes    Types: Marijuana  . Sexual activity: Not on file  Lifestyle  . Physical activity:    Days per week: Not on file    Minutes per session: Not on file  . Stress: Not on file  Relationships  . Social connections:    Talks on phone: Not on file    Gets together: Not on file    Attends religious service: Not on file    Active member of club or  organization: Not on file    Attends meetings of clubs or organizations: Not on file    Relationship status: Not on file  Other Topics Concern  . Not on file  Social History Narrative  . Not on file   Additional Social History:                         Sleep: Good  Appetite:  Good  Current Medications: Current Facility-Administered Medications  Medication Dose Route Frequency Provider Last Rate Last Dose  . acetaminophen (TYLENOL) tablet 650 mg  650 mg Oral Q6H PRN Nira Conn A, NP   650 mg at 10/10/17 0928  . alum & mag hydroxide-simeth (MAALOX/MYLANTA) 200-200-20 MG/5ML suspension 30 mL  30 mL Oral Q4H PRN Nira Conn A, NP      . aspirin EC tablet 325 mg  325 mg Oral Daily Nira Conn A, NP   325 mg at 10/11/17 0803  . atorvastatin (LIPITOR) tablet 80 mg  80 mg Oral q1800 Nira Conn A, NP   80 mg at 10/10/17 1831  . furosemide (LASIX) tablet 40 mg  40 mg Oral Daily Nira Conn A, NP   40 mg at 10/11/17 0803  . insulin aspart (novoLOG) injection 0-15 Units  0-15 Units Subcutaneous TID WC Shon Hale, MD   3 Units at 10/10/17 1732  . insulin aspart (novoLOG) injection 0-5 Units  0-5 Units Subcutaneous QHS Shon Hale, MD   Stopped at 10/09/17 2205  . insulin aspart (novoLOG) injection 3 Units  3 Units Subcutaneous TID WC Shon Hale, MD   3 Units at 10/11/17 0627  . insulin glargine (LANTUS) injection 30 Units  30 Units Subcutaneous QHS Shon Hale, MD   30 Units at 10/10/17 2144  . lisinopril (PRINIVIL,ZESTRIL) tablet 5 mg  5 mg Oral Daily Emokpae, Courage, MD   5 mg at 10/11/17 0803  . magnesium hydroxide (MILK OF MAGNESIA) suspension 30 mL  30 mL Oral Daily PRN Jackelyn Poling, NP      . Melatonin TABS 6 mg  6 mg Oral QHS PRN Nira Conn A, NP      . metFORMIN (GLUCOPHAGE) tablet 1,000 mg  1,000 mg Oral BID WC Nira Conn A, NP   1,000 mg at 10/11/17 0803  . metoprolol tartrate (LOPRESSOR) tablet 50 mg  50 mg Oral BID Nira Conn A, NP   50 mg  at 10/11/17 0803  . naltrexone (DEPADE) tablet 50 mg  50 mg Oral Daily Antonieta Pert, MD   50 mg at 10/11/17 0803  . paliperidone (INVEGA) 24 hr tablet 3 mg  3 mg Oral QHS Antonieta Pert, MD   3 mg at 10/10/17 2144  .  potassium chloride SA (K-DUR,KLOR-CON) CR tablet 20 mEq  20 mEq Oral Daily Cobos, Rockey Situ, MD   20 mEq at 10/11/17 0803  . sertraline (ZOLOFT) tablet 75 mg  75 mg Oral Daily Cobos, Rockey Situ, MD   75 mg at 10/11/17 0803  . traZODone (DESYREL) tablet 150 mg  150 mg Oral QHS Thermon Leyland, NP   150 mg at 10/10/17 2144    Lab Results:  Results for orders placed or performed during the hospital encounter of 09/30/17 (from the past 48 hour(s))  Glucose, capillary     Status: Abnormal   Collection Time: 10/09/17  5:05 PM  Result Value Ref Range   Glucose-Capillary 111 (H) 70 - 99 mg/dL  Glucose, capillary     Status: Abnormal   Collection Time: 10/09/17  8:54 PM  Result Value Ref Range   Glucose-Capillary 127 (H) 70 - 99 mg/dL   Comment 1 Notify RN    Comment 2 Document in Chart   Glucose, capillary     Status: None   Collection Time: 10/10/17  5:49 AM  Result Value Ref Range   Glucose-Capillary 70 70 - 99 mg/dL   Comment 1 Notify RN    Comment 2 Document in Chart   Glucose, capillary     Status: Abnormal   Collection Time: 10/10/17 11:43 AM  Result Value Ref Range   Glucose-Capillary 168 (H) 70 - 99 mg/dL   Comment 1 Notify RN    Comment 2 Document in Chart   Glucose, capillary     Status: Abnormal   Collection Time: 10/10/17  5:09 PM  Result Value Ref Range   Glucose-Capillary 188 (H) 70 - 99 mg/dL   Comment 1 Notify RN    Comment 2 Document in Chart   Glucose, capillary     Status: Abnormal   Collection Time: 10/10/17  9:20 PM  Result Value Ref Range   Glucose-Capillary 180 (H) 70 - 99 mg/dL  Glucose, capillary     Status: None   Collection Time: 10/11/17  6:08 AM  Result Value Ref Range   Glucose-Capillary 72 70 - 99 mg/dL   Comment 1 Notify RN     Comment 2 Document in Chart   Glucose, capillary     Status: None   Collection Time: 10/11/17 12:03 PM  Result Value Ref Range   Glucose-Capillary 87 70 - 99 mg/dL   Comment 1 Notify RN    Comment 2 Document in Chart     Blood Alcohol level:  Lab Results  Component Value Date   ETH <10 09/29/2017    Metabolic Disorder Labs: Lab Results  Component Value Date   HGBA1C 10.0 (H) 10/01/2017   MPG 240.3 10/01/2017   MPG 165.68 06/17/2017   No results found for: PROLACTIN Lab Results  Component Value Date   CHOL 209 (H) 10/01/2017   TRIG 128 10/01/2017   HDL 32 (L) 10/01/2017   CHOLHDL 6.5 10/01/2017   VLDL 26 10/01/2017   LDLCALC 151 (H) 10/01/2017   LDLCALC 146 (H) 06/19/2017    Physical Findings: AIMS: Facial and Oral Movements Muscles of Facial Expression: None, normal Lips and Perioral Area: None, normal Jaw: None, normal Tongue: None, normal,Extremity Movements Upper (arms, wrists, hands, fingers): None, normal Lower (legs, knees, ankles, toes): None, normal, Trunk Movements Neck, shoulders, hips: None, normal, Overall Severity Severity of abnormal movements (highest score from questions above): None, normal Incapacitation due to abnormal movements: None, normal Patient's awareness of abnormal movements (  rate only patient's report): No Awareness, Dental Status Current problems with teeth and/or dentures?: No Does patient usually wear dentures?: No  CIWA:    COWS:     Musculoskeletal: Strength & Muscle Tone: within normal limits Gait & Station: normal Patient leans: N/A  Psychiatric Specialty Exam: Physical Exam  Nursing note and vitals reviewed. Constitutional: He is oriented to person, place, and time. He appears well-developed and well-nourished.  HENT:  Head: Normocephalic and atraumatic.  Respiratory: Effort normal.  Neurological: He is alert and oriented to person, place, and time.    ROS  Blood pressure 122/86, pulse 70, temperature 98.5 F  (36.9 C), temperature source Oral, resp. rate 18, height 6' (1.829 m), weight 83.9 kg, SpO2 100 %.Body mass index is 25.09 kg/m.  General Appearance: Casual  Eye Contact:  Fair  Speech:  Normal Rate  Volume:  Normal  Mood:  Dysphoric  Affect:  Congruent  Thought Process:  Coherent and Descriptions of Associations: Circumstantial  Orientation:  Full (Time, Place, and Person)  Thought Content:  Hallucinations: Auditory  Suicidal Thoughts:  No  Homicidal Thoughts:  No  Memory:  Immediate;   Fair Recent;   Fair Remote;   Fair  Judgement:  Intact  Insight:  Lacking  Psychomotor Activity:  Normal  Concentration:  Concentration: Fair and Attention Span: Fair  Recall:  [employer] of Knowledge:  Fair  Language:  Fair  Akathisia:  Negative  Handed:  Right  AIMS (if indicated):     Assets:  Desire for Improvement Housing Physical Health  ADL's:  Intact  Cognition:  WNL  Sleep:  Number of Hours: 6.75     Treatment Plan Summary: Daily contact with patient to assess and evaluate symptoms and progress in treatment, Medication management and Plan : Patient is seen and examined.  Patient is a 52 year old male with the above-stated past psychiatric history seen in follow-up.  #1 schizoaffective disorder-currently well controlled with oral paliperidone 3 mg p.o. nightly as well as the paliperidone long-acting injection 156 mg.  This was given 3 days ago.  He also continues on sertraline 75 mg p.o. daily.  #2 diabetes mellitus-currently stable.  #3 alcohol dependence-he had been receiving the naltrexone injection as an outpatient, but inpatient we only have available the oral naltrexone dosage.  He continues on 50 mg a day.  #4 hypertension-metoprolol currently controlling this as well as lisinopril 5 mg a day.  #5 disposition we will find out about a 30-day rehab program tomorrow, and if it does not go to be available for more than a week or so we will plan on discharge to home with outpatient  resources.  Antonieta Pert, MD 10/11/2017, 1:05 PM

## 2017-10-11 NOTE — [facility] Group Notes (Signed)
Pt was alert and engaged in therapeutic relaxation group. 

## 2017-10-11 NOTE — Progress Notes (Signed)
Patient ID: Marcus Cummings., male   DOB: 10-06-1965, 52 y.o.   MRN: [id number] [facility]:  (Nursing/MHT/Case Management/Adjunct)  Date:  10/11/2017  Time:  3:16 PM  Type of Therapy:  Played a non-competitive learning/communication board game that fosters learning skills as well as self expression.  Participation Level: Did not Attend  Participation Quality: N/A  Affect: N/A  Cognitive:  N/A  Insight:  N/A  Engagement in Group:  N/A Modes of Intervention:  N/A  Summary of Progress/Problems: Did not Attend [name] 10/11/2017, 3:16 PM

## 2017-10-11 NOTE — Progress Notes (Signed)
Patient ID: Marcus BatheGregory L Hovsepian Sr., male   DOB: July 18, 1965, [age] y.o.   MRN: [id number]  D: Pt reports that his sleep quality last night was fair, reports a fair appetite, a low energy level and poor concentration level.  Pt rates his depression today as 10 (10 being the worst), rates his hopelessness level as 10 (10 being the worst), and rates his anxiety level today as 10 (10 being the highest).  Pt endorses +SI, denie shaving a plan, and verbally contracts for safety on the unit.  Pt is reclusive to his room with limited interactions with peers and staff.  A: Pt being monitored for safety via Q15 minute safety checks, and educated on the need to seek out staff's assistance as needed.  Pt being given all of his medications as scheduled.  R: Pt will continue to be monitored on Q15 minute checks and all concerns will be addressed as needed.

## 2017-10-11 NOTE — Progress Notes (Signed)
Patient has been isolative to his room tonight. He did not attend AA meeting. He had to be reminded several times to come to med window for his medications. He reported feeling okay but was a little tired. Support given and safety maintained on unit with 15 min checks.

## 2017-10-12 LAB — GLUCOSE, CAPILLARY
GLUCOSE-CAPILLARY: 94 mg/dL (ref 70–99)
Glucose-Capillary: 129 mg/dL — ABNORMAL HIGH (ref 70–99)
Glucose-Capillary: 214 mg/dL — ABNORMAL HIGH (ref 70–99)
Glucose-Capillary: 118 mg/dL — ABNORMAL HIGH (ref 70–99)

## 2017-10-12 NOTE — Progress Notes (Signed)
Recreation Therapy Notes  Date: 9.16.19 Time: 0930 Location: [address] Hall Dayroom  Group Topic: Stress Management  Goal Area(s) Addresses:  Patient will verbalize importance of using healthy stress management.  Patient will identify positive emotions associated with healthy stress management.   Behavioral Response:  Engaged  Intervention: Stress Management  Activity : Guided Imagery.  LRT introduced the stress management technique of guided imagery.  LRT read a script for a forest meditation.  Patients were to follow along as the script was read to engage in the activity.  Education:  Stress Management, Discharge Planning.   Education Outcome: Acknowledges edcuation/In group clarification offered/Needs additional education  Clinical Observations/Feedback: Pt attended and participated in group.    Marcus Cummings, LRT/CTRS         Marcus Cummings A 10/12/2017 12:03 PM 

## 2017-10-12 NOTE — Plan of Care (Signed)
Nurse discussed anxiety, depression, coping skills with patient. 

## 2017-10-12 NOTE — Progress Notes (Signed)
Patient did not attend the evening speaker AA meeting. Pt was notified that group was beginning but remained in bed.   

## 2017-10-12 NOTE — Progress Notes (Signed)
[facility] MD Progress Note  10/12/2017 1:59 PM [place].  MRN:  [id number] Subjective: Patient is seen and examined.  Patient is a 52 year old male with a reported past psychiatric history significant for schizoaffective disorder, alcohol use disorder/alcohol dependence and posttraumatic stress disorder.  He is seen in follow-up.  He really does not have any complaints today.  The plan now will be for him to be discharged Wednesday morning and [facility].  He is in agreement with this plan.  He denied any auditory or visual hallucinations this morning.  He denied any suicidal or homicidal ideation.  He stated he was sleeping well.  He actually had no complaints today.  His vital signs are stable, he is afebrile. Principal Problem: Schizoaffective disorder ([place]) Diagnosis:   Patient Active Problem List   Diagnosis Date Noted  . Cocaine abuse ([place]) [F14.10] 10/10/2017  . Schizoaffective disorder ([place]) [F25.9] 09/30/2017  . 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 Essential hypertension [I10]   . Dyslipidemia [E78.5]   . Chest pain on exertion [R07.9]   . Stable angina ([place]) [I20.8] 06/17/2017   Total Time spent with patient: 15 minutes  Past Psychiatric History: See admission H&P  Past Medical History:  Past Medical History:  Diagnosis Date  . Diabetes mellitus without complication ([place])   . Hypertension   . MI, acute, non ST segment elevation ([place]) 2007   stents x 2 2007 ([place])  . Stroke [facility]) 2017    Past Surgical History:  Procedure Laterality Date  . CORONARY ARTERY BYPASS GRAFT N/A 06/23/2017   Procedure: CORONARY ARTERY BYPASS GRAFTING (CABG) x 3; Using Left Internal Mammary Artery and Right Leg Greater Saphenous Vein harvested endoscopically.;  Surgeon: Ivin Poot, MD;  Location: [place];  Service: Open Heart Surgery;  Laterality: N/A;  . LEFT HEART CATH AND CORONARY ANGIOGRAPHY N/A 06/18/2017   Procedure: LEFT HEART CATH AND CORONARY  ANGIOGRAPHY;  Surgeon: Troy Sine, MD;  Location: [place] [facility];  Service: Cardiovascular;  Laterality: N/A;  . TEE WITHOUT CARDIOVERSION N/A 06/23/2017   Procedure: TRANSESOPHAGEAL ECHOCARDIOGRAM (TEE);  Surgeon: Prescott Gum, Collier Salina, MD;  Location: [place];  Service: Open Heart Surgery;  Laterality: N/A;   Family History:  Family History  Problem Relation Age of Onset  . CAD Mother   . Congestive Heart Failure Father    Family Psychiatric  History: See admission H&P Social History:  Social History   Substance and Sexual Activity  Alcohol Use Yes   Comment: 1-2  beers a week     Social History   Substance and Sexual Activity  Drug Use Yes  . Types: Marijuana    Social History   Socioeconomic History  . Marital status: Single    Spouse name: Not on file  . Number of children: Not on file  . Years of education: Not on file  . Highest education level: Not on file  Occupational History  . Not on file  Social Needs  . Financial resource strain: Not on file  . Food insecurity:    Worry: Not on file    Inability: Not on file  . Transportation needs:    Medical: Not on file    Non-medical: Not on file  Tobacco Use  . Smoking status: Never Smoker  . Smokeless tobacco: Never Used  Substance and Sexual Activity  . Alcohol use: Yes    Comment: 1-2  beers a week  .  Drug use: Yes    Types: Marijuana  . Sexual activity: Not on file  Lifestyle  . Physical activity:    Days per week: Not on file    Minutes per session: Not on file  . Stress: Not on file  Relationships  . Social connections:    Talks on phone: Not on file    Gets together: Not on file    Attends religious service: Not on file    Active member of club or organization: Not on file    Attends meetings of clubs or organizations: Not on file    Relationship status: Not on file  Other Topics Concern  . Not on file  Social History Narrative  . Not on file   Additional Social History:                          Sleep: Good  Appetite:  Good  Current Medications: Current Facility-Administered Medications  Medication Dose Route Frequency Provider Last Rate Last Dose  . acetaminophen (TYLENOL) tablet 650 mg  650 mg Oral Q6H PRN Lindon Romp A, NP   650 mg at 10/10/17 0928  . alum & mag hydroxide-simeth (MAALOX/MYLANTA) 200-200-20 MG/5ML suspension 30 mL  30 mL Oral Q4H PRN Lindon Romp A, NP      . aspirin EC tablet 325 mg  325 mg Oral Daily Lindon Romp A, NP   325 mg at 10/12/17 0759  . atorvastatin (LIPITOR) tablet 80 mg  80 mg Oral q1800 Lindon Romp A, NP   80 mg at 10/11/17 1854  . furosemide (LASIX) tablet 40 mg  40 mg Oral Daily Lindon Romp A, NP   40 mg at 10/12/17 0800  . insulin aspart (novoLOG) injection 0-15 Units  0-15 Units Subcutaneous TID WC Roxan Hockey, MD   2 Units at 10/12/17 1211  . insulin aspart (novoLOG) injection 0-5 Units  0-5 Units Subcutaneous QHS Roxan Hockey, MD   Stopped at 10/09/17 2205  . insulin aspart (novoLOG) injection 3 Units  3 Units Subcutaneous TID WC Emokpae, Courage, MD   3 Units at 10/12/17 1210  . insulin glargine (LANTUS) injection 30 Units  30 Units Subcutaneous QHS Roxan Hockey, MD   30 Units at 10/11/17 2207  . lisinopril (PRINIVIL,ZESTRIL) tablet 5 mg  5 mg Oral Daily Emokpae, Courage, MD   5 mg at 10/12/17 0800  . magnesium hydroxide (MILK OF MAGNESIA) suspension 30 mL  30 mL Oral Daily PRN Rozetta Nunnery, NP      . Melatonin TABS 6 mg  6 mg Oral QHS PRN Lindon Romp A, NP      . metFORMIN (GLUCOPHAGE) tablet 1,000 mg  1,000 mg Oral BID WC Lindon Romp A, NP   1,000 mg at 10/12/17 0800  . metoprolol tartrate (LOPRESSOR) tablet 50 mg  50 mg Oral BID Lindon Romp A, NP   50 mg at 10/12/17 0800  . naltrexone (DEPADE) tablet 50 mg  50 mg Oral Daily Sharma Covert, MD   50 mg at 10/12/17 0800  . paliperidone (INVEGA) 24 hr tablet 3 mg  3 mg Oral QHS Sharma Covert, MD   3 mg at 10/11/17 2206  . potassium chloride SA  (K-DUR,KLOR-CON) CR tablet 20 mEq  20 mEq Oral Daily Cobos, Myer Peer, MD   20 mEq at 10/12/17 0801  . sertraline (ZOLOFT) tablet 75 mg  75 mg Oral Daily Cobos, Myer Peer, MD   75 mg  at 10/12/17 0801  . traZODone (DESYREL) tablet 150 mg  150 mg Oral QHS Niel Hummer, NP   150 mg at 10/11/17 2206    Lab Results:  Results for orders placed or performed during the hospital encounter of 09/30/17 (from the past 48 hour(s))  Glucose, capillary     Status: Abnormal   Collection Time: 10/10/17  5:09 PM  Result Value Ref Range   Glucose-Capillary 188 (H) 70 - 99 mg/dL   Comment 1 Notify RN    Comment 2 Document in Chart   Glucose, capillary     Status: Abnormal   Collection Time: 10/10/17  9:20 PM  Result Value Ref Range   Glucose-Capillary 180 (H) 70 - 99 mg/dL  Glucose, capillary     Status: None   Collection Time: 10/11/17  6:08 AM  Result Value Ref Range   Glucose-Capillary 72 70 - 99 mg/dL   Comment 1 Notify RN    Comment 2 Document in Chart   Glucose, capillary     Status: None   Collection Time: 10/11/17 12:03 PM  Result Value Ref Range   Glucose-Capillary 87 70 - 99 mg/dL   Comment 1 Notify RN    Comment 2 Document in Chart   Glucose, capillary     Status: Abnormal   Collection Time: 10/11/17  5:11 PM  Result Value Ref Range   Glucose-Capillary 199 (H) 70 - 99 mg/dL   Comment 1 Notify RN    Comment 2 Document in Chart   Basic metabolic panel     Status: Abnormal   Collection Time: 10/11/17  6:26 PM  Result Value Ref Range   Sodium 136 135 - 145 mmol/L   Potassium 4.2 3.5 - 5.1 mmol/L   Chloride 99 98 - 111 mmol/L   CO2 26 22 - 32 mmol/L   Glucose, Bld 218 (H) 70 - 99 mg/dL   BUN 15 6 - 20 mg/dL   Creatinine, Ser 1.17 0.61 - 1.24 mg/dL   Calcium 8.8 (L) 8.9 - 10.3 mg/dL   GFR calc non Af Amer >60 >60 mL/min   GFR calc Af Amer >60 >60 mL/min    Comment: (NOTE) The eGFR has been calculated using the CKD EPI equation. This calculation has not been validated in all  clinical situations. eGFR's persistently <60 mL/min signify possible Chronic Kidney Disease.    Anion gap 11 5 - 15    Comment: Performed at [facility], [place] [address] [place], [place] [address]  Glucose, capillary     Status: Abnormal   Collection Time: 10/11/17  9:18 PM  Result Value Ref Range   Glucose-Capillary 135 (H) 70 - 99 mg/dL   Comment 1 Notify RN    Comment 2 Document in Chart   Glucose, capillary     Status: None   Collection Time: 10/12/17  5:56 AM  Result Value Ref Range   Glucose-Capillary 94 70 - 99 mg/dL   Comment 1 Notify RN    Comment 2 Document in Chart   Glucose, capillary     Status: Abnormal   Collection Time: 10/12/17 12:01 PM  Result Value Ref Range   Glucose-Capillary 129 (H) 70 - 99 mg/dL   Comment 1 Notify RN    Comment 2 Document in Chart     Blood Alcohol level:  Lab Results  Component Value Date   ETH <10 17/51/0258    Metabolic Disorder Labs: Lab Results  Component Value Date  HGBA1C 10.0 (H) 10/01/2017   MPG 240.3 10/01/2017   MPG 165.68 06/17/2017   No results found for: PROLACTIN Lab Results  Component Value Date   CHOL 209 (H) 10/01/2017   TRIG 128 10/01/2017   HDL 32 (L) 10/01/2017   CHOLHDL 6.5 10/01/2017   VLDL 26 10/01/2017   LDLCALC 151 (H) 10/01/2017   LDLCALC 146 (H) 06/19/2017    Physical Findings: AIMS: Facial and Oral Movements Muscles of Facial Expression: None, normal Lips and Perioral Area: None, normal Jaw: None, normal Tongue: None, normal,Extremity Movements Upper (arms, wrists, hands, fingers): None, normal Lower (legs, knees, ankles, toes): None, normal, Trunk Movements Neck, shoulders, hips: None, normal, Overall Severity Severity of abnormal movements (highest score from questions above): None, normal Incapacitation due to abnormal movements: None, normal Patient's awareness of abnormal movements (rate only patient's report): No Awareness, Dental Status Current problems with  teeth and/or dentures?: No Does patient usually wear dentures?: No  CIWA:    COWS:     Musculoskeletal: Strength & Muscle Tone: within normal limits Gait & Station: normal Patient leans: N/A  Psychiatric Specialty Exam: Physical Exam  Constitutional: He is oriented to person, place, and time. He appears well-developed and well-nourished.  HENT:  Head: Normocephalic and atraumatic.  Respiratory: Effort normal.  Neurological: He is alert and oriented to person, place, and time.    ROS  Blood pressure 117/76, pulse 64, temperature 98.5 F (36.9 C), temperature source Oral, resp. rate 18, height 6' (1.829 m), weight 83.9 kg, SpO2 100 %.Body mass index is 25.09 kg/m.  General Appearance: Casual  Eye Contact:  Fair  Speech:  Normal Rate  Volume:  Normal  Mood:  Euthymic  Affect:  Flat  Thought Process:  Coherent and Descriptions of Associations: Intact  Orientation:  Full (Time, Place, and Person)  Thought Content:  Logical  Suicidal Thoughts:  No  Homicidal Thoughts:  No  Memory:  Immediate;   Fair Recent;   Fair Remote;   Fair  Judgement:  Intact  Insight:  Lacking  Psychomotor Activity:  Decreased  Concentration:  Concentration: Fair and Attention Span: Fair  Recall:  [employer] of Knowledge:  Fair  Language:  Fair  Akathisia:  Negative  Handed:  Right  AIMS (if indicated):     Assets:  Desire for Improvement Financial Resources/Insurance Housing Physical Health Resilience  ADL's:  Intact  Cognition:  WNL  Sleep:  Number of Hours: 5.25     Treatment Plan Summary: Daily contact with patient to assess and evaluate symptoms and progress in treatment, Medication management and Plan : Patient is seen and examined.  Patient is a 52 year old male with the above-stated past psychiatric history who is seen in follow-up.  #1 schizoaffective disorder-doing well with oral paliperidone 3 mg p.o. nightly as well as the paliperidone long-acting injection which she received 156  mg last week.  He continues on sertraline 75 mg p.o. daily as well.  No change in these medications.  #2 diabetes mellitus-currently well controlled.  #3 hypertension-he continues on metoprolol as well as 5 mg lisinopril a day.  #4-alcohol dependence-he continues on naltrexone 50 mg p.o. daily.  He has received the naltrexone injection as an outpatient.  #5 disposition-patient will [facility] 30-day substance abuse treatment program.  If he is not accepted there, he will just return to his home.  Sharma Covert, MD 10/12/2017, 1:59 PM

## 2017-10-12 NOTE — [facility] Group Notes (Signed)
LCSW Group Therapy Note  10/12/2017 1:15pm  Type of Therapy and Topic:  Group Therapy: Avoiding Self-Sabotaging and Enabling Behaviors  Participation Level:  Did Not Attend--pt invited. Chose to remain in bed.    Description of Group:   In this group, patients will learn how to identify obstacles, self-sabotaging and enabling behaviors, as well as: what are they, why do we do them and what needs these behaviors meet. Discuss unhealthy relationships and how to have positive healthy boundaries with those that sabotage and enable. Explore aspects of self-sabotage and enabling in yourself and how to limit these self-destructive behaviors in everyday life.   Therapeutic Goals: 1. Patient will identify one obstacle that relates to self-sabotage and enabling behaviors 2. Patient will identify one personal self-sabotaging or enabling behavior they did prior to admission 3. Patient will state a plan to change the above identified behavior 4. Patient will demonstrate ability to communicate their needs through discussion and/or role play.   Summary of Patient Progress:  x   Therapeutic Modalities:   Cognitive Behavioral Therapy Person-Centered Therapy Motivational Interviewing   Rona RavensHeather S Geraldy Akridge, LCSW 10/12/2017 11:09 AM

## 2017-10-12 NOTE — Progress Notes (Signed)
D:  Patient's self inventory sheet, patient has good sleep, sleep medication not helpful.  Poor appetite, low energy level, poor concentration.  Rated depression, hopeless and anxiety 10.  Denied withdrawals.  SI, contracts for safety, no plan.       Physical problems, arthritis, legs and back pain.  No pain medicine, feeling better.  Plans to go to groups.  No discharge plans. A:  Medications administered per MD orders.  Emotional support and encouragement given patient. R:  Denied SI and HI, contracts for safety.   Patient denied SI while talking to nurse, contracts for safety. Denied A/V hallucinations.  Safety maintained with 15 minute checks.

## 2017-10-12 NOTE — Progress Notes (Signed)
D: Pt was in bed in his room upon initial approach.  Pt presents with depressed affect and mood.  He describes his day as "okay" and denies having a goal tonight.  Writer and pt made goal for pt to "be safe."  He forwards little information to Clinical research associatewriter.  Pt denies SI/HI, denies hallucinations, reports bilateral leg pain of 5/10.  Pt has been isolative to his room.  He did not attend evening group.  A: Introduced self to pt.  Actively listened to pt and offered support and encouragement. Medications administered per order.  PRN medication administered for pain.  Q15 minute safety checks maintained.  R: Pt is safe on the unit.  Pt is compliant with medications.  Pt verbally contracts for safety.  Will continue to monitor and assess.

## 2017-10-12 NOTE — Progress Notes (Signed)
[facility] confirmed that pt may walk in on Wednesday at 7:45AM for screening and has official screening appt on Monday, 9/16. CSW spoke with pt's sister to provide aftercare update.   Marcus Cummings [name], MSW, LCSW Clinical Social Worker 10/12/2017 11:00 AM

## 2017-10-12 NOTE — [facility] Group Notes (Signed)
LCSW Group Therapy Note   10/12/2017 1:15pm   Type of Therapy and Topic:  Group Therapy:  Overcoming Obstacles   Participation Level:  Minimal   Description of Group:    In this group patients will be encouraged to explore what they see as obstacles to their own wellness and recovery. They will be guided to discuss their thoughts, feelings, and behaviors related to these obstacles. The group will process together ways to cope with barriers, with attention given to specific choices patients can make. Each patient will be challenged to identify changes they are motivated to make in order to overcome their obstacles. This group will be process-oriented, with patients participating in exploration of their own experiences as well as giving and receiving support and challenge from other group members.   Therapeutic Goals: 1. Patient will identify personal and current obstacles as they relate to admission. 2. Patient will identify barriers that currently interfere with their wellness or overcoming obstacles.  3. Patient will identify feelings, thought process and behaviors related to these barriers. 4. Patient will identify two changes they are willing to make to overcome these obstacles:      Summary of Patient Progress Marcus Cummings was attentive during group but did not actively participate in group discussion. When prompted, Marcus Cummings shared that his main obstacle involves getting into residential treatment. He was pleased to learn [facility] he goes to walk-in screening at 7:45AM. Marcus Cummings shared that he is feeling "alittle better" but continues to endorse moderate depression.    Therapeutic Modalities:   Cognitive Behavioral Therapy Solution Focused Therapy Motivational Interviewing Relapse Prevention Therapy  Rona RavensHeather S Siddiq Kaluzny, LCSW 10/12/2017 2:40 PM

## 2017-10-13 LAB — GLUCOSE, CAPILLARY
GLUCOSE-CAPILLARY: 90 mg/dL (ref 70–99)
Glucose-Capillary: 95 mg/dL (ref 70–99)
Glucose-Capillary: 133 mg/dL — ABNORMAL HIGH (ref 70–99)
Glucose-Capillary: 181 mg/dL — ABNORMAL HIGH (ref 70–99)

## 2017-10-13 MED ORDER — LISINOPRIL 5 MG PO TABS: 5.0000 mg | ORAL_TABLET | Freq: Every day | ORAL | 0 refills | Status: DC

## 2017-10-13 MED ORDER — SERTRALINE HCL 25 MG PO TABS: 75.0000 mg | ORAL_TABLET | Freq: Every day | ORAL | 0 refills | Status: DC

## 2017-10-13 MED ORDER — TRAZODONE HCL 150 MG PO TABS: 150.0000 mg | ORAL_TABLET | Freq: Every day | ORAL | 0 refills | Status: DC

## 2017-10-13 MED ORDER — POTASSIUM CHLORIDE CRYS ER 20 MEQ PO TBCR: 20.0000 meq | EXTENDED_RELEASE_TABLET | Freq: Every day | ORAL | 0 refills | Status: DC

## 2017-10-13 MED ORDER — NALTREXONE HCL 50 MG PO TABS: 50.0000 mg | ORAL_TABLET | Freq: Every day | ORAL | 0 refills | Status: DC

## 2017-10-13 MED ORDER — FUROSEMIDE 40 MG PO TABS: 40.0000 mg | ORAL_TABLET | Freq: Every day | ORAL | 0 refills | Status: DC

## 2017-10-13 MED ORDER — ATORVASTATIN CALCIUM 80 MG PO TABS: 80.0000 mg | ORAL_TABLET | Freq: Every day | ORAL | 0 refills | Status: DC

## 2017-10-13 MED ORDER — INSULIN GLARGINE 100 UNIT/ML ~~LOC~~ SOLN: 30.0000 [IU] | Freq: Every day | SUBCUTANEOUS | 0 refills | Status: DC

## 2017-10-13 MED ORDER — METOPROLOL TARTRATE 50 MG PO TABS: 50.0000 mg | ORAL_TABLET | Freq: Two times a day (BID) | ORAL | 0 refills | Status: DC

## 2017-10-13 MED ORDER — PALIPERIDONE ER 3 MG PO TB24: 3.0000 mg | ORAL_TABLET | Freq: Every day | ORAL | 0 refills | Status: DC

## 2017-10-13 MED ORDER — MELATONIN 3 MG PO TABS: 6.0000 mg | ORAL_TABLET | Freq: Every evening | ORAL | 0 refills | Status: DC | PRN

## 2017-10-13 MED ORDER — SERTRALINE HCL 25 MG PO TABS: 75.0000 mg | ORAL_TABLET | Freq: Every day | ORAL | Status: DC

## 2017-10-13 MED ORDER — METFORMIN HCL 1000 MG PO TABS: 1000.0000 mg | ORAL_TABLET | Freq: Two times a day (BID) | ORAL | 0 refills | Status: DC

## 2017-10-13 MED ORDER — PALIPERIDONE PALMITATE ER 234 MG/1.5ML IM SUSY: 234.0000 mg | PREFILLED_SYRINGE | INTRAMUSCULAR | Status: DC

## 2017-10-13 MED ORDER — PALIPERIDONE PALMITATE ER 234 MG/1.5ML IM SUSY: 234.0000 mg | PREFILLED_SYRINGE | INTRAMUSCULAR | 0 refills | Status: DC

## 2017-10-13 NOTE — Discharge Summary (Addendum)
Physician Discharge Summary Note  Patient:  Marcus Cummings. is an 52 y.o., male MRN:  [id number] DOB:  Jun 04, 1965 Patient phone:  [phone] (home)  Patient address:   [address] [place] [address],  Total Time spent with patient: 20 minutes  Date of Admission:  09/30/2017 Date of Discharge: 10/14/17  Reason for Admission:  Worsening depression with SI  Principal Problem: Schizoaffective disorder [facility]) Discharge Diagnoses: Patient Active Problem List   Diagnosis Date Noted  . Cocaine abuse (HCC) [F14.10] 10/10/2017  . Schizoaffective disorder (HCC) [F25.9] 09/30/2017  . 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 Essential hypertension [I10]   . Dyslipidemia [E78.5]   . Chest pain on exertion [R07.9]   . Stable angina (HCC) [I20.8] 06/17/2017    Past Psychiatric History: History of prior psychiatric admissions , most recently 2-3 months ago at [facility], for depression.  Reports history of prior suicidal attempts by overdosing . Reports past , not recent, history of auditory hallucinations. States that in the past he has been diagnosed with Bipolar Disorder but currently does not endorse any clear history of hypomania/mania.  He has been prescribed Invega Sustenna 156 mgrs monthly and on Naltrexone 380 mgrs SUSR monthly. He last got medications " about mid August ). History of alcohol use disorder  Past Medical History:  Past Medical History:  Diagnosis Date  . Diabetes mellitus without complication (HCC)   . Hypertension   . MI, acute, non ST segment elevation (HCC) 2007   stents x 2 2007 [facility] [place])  . Stroke [facility]) 2017    Past Surgical History:  Procedure Laterality Date  . CORONARY ARTERY BYPASS GRAFT N/A 06/23/2017   Procedure: CORONARY ARTERY BYPASS GRAFTING (CABG) x 3; Using Left Internal Mammary Artery and Right Leg Greater Saphenous Vein harvested endoscopically.;  Surgeon: Kerin Perna, MD;  Location: [facility] OR;  Service: Open Heart Surgery;   Laterality: N/A;  . LEFT HEART CATH AND CORONARY ANGIOGRAPHY N/A 06/18/2017   Procedure: LEFT HEART CATH AND CORONARY ANGIOGRAPHY;  Surgeon: Lennette Bihari, MD;  Location: [facility] INVASIVE CV LAB;  Service: Cardiovascular;  Laterality: N/A;  . TEE WITHOUT CARDIOVERSION N/A 06/23/2017   Procedure: TRANSESOPHAGEAL ECHOCARDIOGRAM (TEE);  Surgeon: Donata Clay, Theron Arista, MD;  Location: [facility] OR;  Service: Open Heart Surgery;  Laterality: N/A;   Family History:  Family History  Problem Relation Age of Onset  . CAD Mother   . Congestive Heart Failure Father    Family Psychiatric  History: Denies Social History:  Social History   Substance and Sexual Activity  Alcohol Use Yes   Comment: 1-2  beers a week     Social History   Substance and Sexual Activity  Drug Use Yes  . Types: Marijuana    Social History   Socioeconomic History  . Marital status: Single    Spouse name: Not on file  . Number of children: Not on file  . Years of education: Not on file  . Highest education level: Not on file  Occupational History  . Not on file  Social Needs  . Financial resource strain: Not on file  . Food insecurity:    Worry: Not on file    Inability: Not on file  . Transportation needs:    Medical: Not on file    Non-medical: Not on file  Tobacco Use  . Smoking status: Never Smoker  . Smokeless tobacco: Never Used  Substance and Sexual Activity  . Alcohol use:  Yes    Comment: 1-2  beers a week  . Drug use: Yes    Types: Marijuana  . Sexual activity: Not on file  Lifestyle  . Physical activity:    Days per week: Not on file    Minutes per session: Not on file  . Stress: Not on file  Relationships  . Social connections:    Talks on phone: Not on file    Gets together: Not on file    Attends religious service: Not on file    Active member of club or organization: Not on file    Attends meetings of clubs or organizations: Not on file    Relationship status: Not on file  Other Topics Concern   . Not on file  Social History Narrative  . Not on file    Hospital Course:   09/30/17 [facility] MD Assessment: 70, divorced, lives alone, has two adult children, on [place] disability ( served in [employer]) who presents to the unit with a psychiatric history of Bipolar Disorder, alcohol use disorder, PTSD and depression. Patient initially presented tot he ED with complaints of chest pain. While there, he endorsed that he was having suicidal thoughts. He was medically cleared and transferred tot he [facility] unit. Patient acknowledges his SI prior to admission and at current. He reports a history of chronic depression and reports a couple of days prior to his admission, he overdosed on Melatonin. He states he took a " handful" and did not seek treatment at that time. He describes depressive symptoms anhedonia, decreased energy, decreased sleep, poor appetite. Denies psychotic symptoms although does endorse he has head voices int he past. Reports main stressor as chronic medical conditions (CVA in 2017, had bypass surgery in May 2019). He reports he has had multiple psychiatric admissions most recently 2-3 months ago at [facility], for depression. Reports multiple suicidal attempts by overdosing.He reports he has tried several psychotropic medication but at current, he is on [place] 156 mgrs monthly and on Naltrexone 380 mgrs SUSR monthly. He last got medications " about mid August ). In regards to his history of alcohol use disorder, he reports he last drank 4-5 days ago. Reports his drinking has significantly decreased, and recently states he has been drinking 3-4 x per week, one beer per episode . Endorses using cannabis once x week, denies other drug abuse. He reports a history of violent behaviors although none recently.He denies homicidal thoughts at this time. He reports his goal is to be discharged to a [place] facility for long term treatment of his mental health issues. He is contracting for safety at this  time.  Patient remained on the [facility] unit for 14 days. The patient stabilized on medication and therapy. Patient was discharged on Naltrexone 50 mg Daily, Trazodone 150 mg QHS, Invega Sustenna 234 mg IM due on 11-04-17 and Invega 3 mg PO for next 21 days. Patient has shown improvement with improved mood, affect, sleep, appetite, and interaction. Patient has attended group and participated. Patient has been seen in the day room interacting with peers and staff appropriately. Patient denies any SI/HI/AVH and contracts for safety. Patient agrees to follow up at [facility] residential. Patient is provided with prescriptions for their medications upon discharge.   Physical Findings: AIMS: Facial and Oral Movements Muscles of Facial Expression: None, normal Lips and Perioral Area: None, normal Jaw: None, normal Tongue: None, normal,Extremity Movements Upper (arms, wrists, hands, fingers): None, normal Lower (legs, knees, ankles, toes): None, normal,  Trunk Movements Neck, shoulders, hips: None, normal, Overall Severity Severity of abnormal movements (highest score from questions above): None, normal Incapacitation due to abnormal movements: None, normal Patient's awareness of abnormal movements (rate only patient's report): No Awareness, Dental Status Current problems with teeth and/or dentures?: No Does patient usually wear dentures?: No  CIWA:  CIWA-Ar Total: 1 COWS:     Musculoskeletal: Strength & Muscle Tone: within normal limits Gait & Station: normal Patient leans: N/A  Psychiatric Specialty Exam: Physical Exam  Nursing note and vitals reviewed. Constitutional: He is oriented to person, place, and time. He appears well-developed and well-nourished.  Cardiovascular: Normal rate.  Respiratory: Effort normal.  Musculoskeletal: Normal range of motion.  Neurological: He is alert and oriented to person, place, and time.  Skin: Skin is warm.    Review of Systems  Constitutional: Negative.    HENT: Negative.   Eyes: Negative.   Respiratory: Negative.   Cardiovascular: Negative.   Gastrointestinal: Negative.   Genitourinary: Negative.   Musculoskeletal: Negative.   Skin: Negative.   Neurological: Negative.   Endo/Heme/Allergies: Negative.   Psychiatric/Behavioral: Negative.     Blood pressure 114/67, pulse 75, temperature 98.4 F (36.9 C), temperature source Oral, resp. rate 20, height 6' (1.829 m), weight 83.9 kg, SpO2 94 %.Body mass index is 25.09 kg/m.  General Appearance: Casual  Eye Contact:  Good  Speech:  Clear and Coherent and Normal Rate  Volume:  Normal  Mood:  Euthymic  Affect:  Congruent  Thought Process:  Goal Directed and Descriptions of Associations: Intact  Orientation:  Full (Time, Place, and Person)  Thought Content:  WDL  Suicidal Thoughts:  No  Homicidal Thoughts:  No  Memory:  Immediate;   Good Recent;   Good Remote;   Good  Judgement:  Fair  Insight:  Fair  Psychomotor Activity:  Normal  Concentration:  Concentration: Good and Attention Span: Good  Recall:  Good  Fund of Knowledge:  Good  Language:  Good  Akathisia:  No  Handed:  Right  AIMS (if indicated):     Assets:  Communication Skills Desire for Improvement Financial Resources/Insurance Housing Physical Health Social Support Transportation  ADL's:  Intact  Cognition:  WNL  Sleep:  Number of Hours: 6.75     Have you used any form of tobacco in the last 30 days? (Cigarettes, Smokeless Tobacco, Cigars, and/or Pipes): Yes  Has this patient used any form of tobacco in the last 30 days? (Cigarettes, Smokeless Tobacco, Cigars, and/or Pipes) Yes, No  Blood Alcohol level:  Lab Results  Component Value Date   ETH <10 09/29/2017    Metabolic Disorder Labs:  Lab Results  Component Value Date   HGBA1C 10.0 (H) 10/01/2017   MPG 240.3 10/01/2017   MPG 165.68 06/17/2017   No results found for: PROLACTIN Lab Results  Component Value Date   CHOL 209 (H) 10/01/2017   TRIG  128 10/01/2017   HDL 32 (L) 10/01/2017   CHOLHDL 6.5 10/01/2017   VLDL 26 10/01/2017   LDLCALC 151 (H) 10/01/2017   LDLCALC 146 (H) 06/19/2017    See Psychiatric Specialty Exam and Suicide Risk Assessment completed by Attending Physician prior to discharge.  Discharge destination:  [facility]  Is patient on multiple antipsychotic therapies at discharge:  No   Has Patient had three or more failed trials of antipsychotic monotherapy by history:  No  Recommended Plan for Multiple Antipsychotic Therapies: NA   Allergies as of 10/13/2017   No Known Allergies  Medication List    STOP taking these medications   glipiZIDE 5 MG tablet Commonly known as:  GLUCOTROL   Melatonin 3 MG Caps Replaced by:  Melatonin 3 MG Tabs   Naltrexone 380 MG Susr   Potassium Chloride ER 20 MEQ Tbcr     TAKE these medications     Indication  aspirin 325 MG EC tablet Take 1 tablet (325 mg total) by mouth daily.  Indication:  Per PCP   atorvastatin 80 MG tablet Commonly known as:  LIPITOR Take 1 tablet (80 mg total) by mouth daily at 6 PM.  Indication:  High Amount of Fats in the Blood   furosemide 40 MG tablet Commonly known as:  LASIX Take 1 tablet (40 mg total) by mouth daily.  Indication:  High Blood Pressure Disorder   insulin glargine 100 UNIT/ML injection Commonly known as:  LANTUS Inject 0.3 mLs (30 Units total) into the skin at bedtime.  Indication:  Type 2 Diabetes   lisinopril 5 MG tablet Commonly known as:  PRINIVIL,ZESTRIL Take 1 tablet (5 mg total) by mouth daily. For high blood pressure Start taking on:  10/14/2017  Indication:  High Blood Pressure Disorder   Melatonin 3 MG Tabs Take 2 tablets (6 mg total) by mouth at bedtime as needed (for sleep). Replaces:  Melatonin 3 MG Caps  Indication:  Trouble Sleeping   metFORMIN 1000 MG tablet Commonly known as:  GLUCOPHAGE Take 1 tablet (1,000 mg total) by mouth 2 (two) times daily with a meal.  Indication:   Type 2 Diabetes   metoprolol tartrate 50 MG tablet Commonly known as:  LOPRESSOR Take 1 tablet (50 mg total) by mouth 2 (two) times daily.  Indication:  High Blood Pressure Disorder   naltrexone 50 MG tablet Commonly known as:  DEPADE Take 1 tablet (50 mg total) by mouth daily. Start taking on:  10/14/2017  Indication:  Excessive Use of Alcohol   paliperidone 234 MG/1.5ML Susy injection Commonly known as:  INVEGA SUSTENNA Inject 234 mg into the muscle every 28 (twenty-eight) days. Start taking on:  11/04/2017 What changed:    medication strength  how much to take  Indication:  Schizoaffective Disorder   paliperidone 3 MG 24 hr tablet Commonly known as:  INVEGA Take 1 tablet (3 mg total) by mouth at bedtime for 21 days. Stop use the day before next [place] injection  Indication:  Schizoaffective Disorder   potassium chloride SA 20 MEQ tablet Commonly known as:  K-DUR,KLOR-CON Take 1 tablet (20 mEq total) by mouth daily. Start taking on:  10/14/2017  Indication:  Low Amount of Potassium in the Blood   sertraline 25 MG tablet Commonly known as:  ZOLOFT Take 3 tablets (75 mg total) by mouth daily. For mood ocntrol Start taking on:  10/14/2017  Indication:  mood stability   traZODone 150 MG tablet Commonly known as:  DESYREL Take 1 tablet (150 mg total) by mouth at bedtime. For mood control What changed:    medication strength  how much to take  additional instructions  Indication:  mood stability      Follow-up Information    [facility] Follow up on 10/14/2017.   Why:  Hospital follow-up/medication management appt with Dr. Yancey Flemings on Wed, 9/18 at 8:30AM. Please request referral consult for PTSD/substance abuse program if you are still interested in pursing residential treatment. Thank you.  Contact information: [address] [place], [place] [address] Phone: [phone] ext. [phone] Fax: [phone]  [facility] Follow up on 10/19/2017.   Why:  Screening for possible admission on Monday, 10/19/17 at 7:45AM.  Contact information: [address] [place] [address] [phone]           Follow-up recommendations:  Continue activity as tolerated. Continue diet as recommended by your PCP. Ensure to keep all appointments with outpatient providers.  Comments:  Patient is instructed prior to discharge to: Take all medications as prescribed by his/her mental healthcare provider. Report any adverse effects and or reactions from the medicines to his/her outpatient provider promptly. Patient has been instructed & cautioned: To not engage in alcohol and or illegal drug use while on prescription medicines. In the event of worsening symptoms, patient is instructed to call the crisis hotline, 911 and or go to the nearest ED for appropriate evaluation and treatment of symptoms. To follow-up with his/her primary care provider for your other medical issues, concerns and or health care needs.    Signed: Gerlene Burdock Money, FNP 10/13/2017, 10:06 AM   Patient seen, Suicide Assessment Completed.  Disposition Plan Reviewed

## 2017-10-13 NOTE — Plan of Care (Signed)
  Problem: Education: Goal: Knowledge of [facility] Education information/materials will improve Outcome: Completed/Met Goal: Emotional status will improve Outcome: Completed/Met Goal: Mental status will improve Outcome: Completed/Met Goal: Verbalization of understanding the information provided will improve Outcome: Completed/Met   Problem: Health Behavior/Discharge Planning: Goal: Identification of resources available to assist in meeting health care needs will improve Outcome: Completed/Met Goal: Compliance with treatment plan for underlying cause of condition will improve Outcome: Completed/Met   Problem: Safety: Goal: Periods of time without injury will increase Outcome: Completed/Met   Problem: Education: Goal: Utilization of techniques to improve thought processes will improve Outcome: Completed/Met Goal: Knowledge of the prescribed therapeutic regimen will improve Outcome: Completed/Met   Problem: Self-Concept: Goal: Will verbalize positive feelings about self Outcome: Completed/Met Goal: Level of anxiety will decrease Outcome: Completed/Met   Problem: Medication: Goal: Compliance with prescribed medication regimen will improve Outcome: Completed/Met   Problem: Coping: Goal: Ability to identify and develop effective coping behavior will improve Outcome: Completed/Met Goal: Ability to interact with others will improve Outcome: Completed/Met Goal: Demonstration of participation in decision-making regarding own care will improve Outcome: Completed/Met Goal: Ability to use eye contact when communicating with others will improve Outcome: Completed/Met

## 2017-10-13 NOTE — Progress Notes (Signed)
D: Patient denies any depressive symptoms today.  His affect is flat and blunted; his mood appears depressed.  Patient is planning to be an early discharge tomorrow by 0700.  [facility]  He will receive samples of his medications and prescriptions.  He denies any thoughts of self harm today.  A: Continue to monitor medication management and MD orders.  Safety checks completed every 15 minutes per protocol.  Offer support and encouragement as needed.  R: Patient is receptive to staff; his behavior is appropriate.

## 2017-10-13 NOTE — [facility] Group Notes (Signed)
[facility] Group Therapy 10/13/2017 1:15pm  Type of Therapy: Mental Health Association Presentation  Participation Level: Pt invited. Chose to remain in bed.   Rona RavensHeather S Myquan Schaumburg, LCSW 10/13/2017 2:04 PM

## 2017-10-13 NOTE — [facility] Group Notes (Signed)
Pt did not attend wrap up group this evening. Pt stayed in bed asleep.

## 2017-10-13 NOTE — Progress Notes (Signed)
[facility]  He has cab voucher, medication samples and prescriptions on his shadow chart.  Reviewed AVS with patient and he indicated understanding.  Belongings in locker need to be retrieved.  Patient is aware he needs to be ready for a 0700 discharge.

## 2017-10-13 NOTE — Progress Notes (Signed)
  [facility] Dis[facility] :  Will you be returning to the same living situation after discharge:  No. pt [facility].  At discharge, do you have transportation home?: Yes,  taxi voucher in chart. PATIENT MUST DISCHARGE BY 7AM ON 9/18 IN ORDER TO GET TO [facility] FOR SCREENING Do you have the ability to pay for your medications: Yes,  mental health/VA connected.   Release of information consent forms completed and submitted to medical records by CSW.   Patient to Follow up at: Follow-up Information    [facility].   Why:  You have been rescheduled for Monday 02/01/18 at 9:00AM withe Dr. Yancey Flemingsastillo. If you would like to be seen for mental health sooner, you may also walk into the clinic Mon-Fri between 8am-3pm to be assessed. Thank you.  Contact information: [address] [place], [place] [address] Phone: [phone] ext. [phone] Fax: [phone]       [facility] Follow up on 10/13/2017.   Why:  Please walk in to be screened for possible placement on Wed, 9/17 at 7:45AM.You will need: photo ID, 2 week supply of medications, and clothing. Thank you.  Contact information: [place] [place] [address] [phone]           Next level of care provider has access to [facility] Link:no  Safety Planning and Suicide Prevention discussed: Yes,  SPE completed with pt's sister and with pt. SPI pamphlet and Mobile Crisis information provided.   Have you used any form of tobacco in the last 30 days? (Cigarettes, Smokeless Tobacco, Cigars, and/or Pipes): Yes  Has patient been referred to the Quitline?: Patient refused referral  Patient has been referred for addiction treatment: Yes  Rona RavensHeather S Rayme Bui, LCSW 10/13/2017, 11:06 AM

## 2017-10-13 NOTE — BHH Suicide Risk Assessment (Signed)
[facility]   Principal Problem: Schizoaffective disorder [facility](HCC) Discharge Diagnoses:  Patient Active Problem List   Diagnosis Date Noted  . Cocaine abuse (HCC) [F14.10] 10/10/2017  . Schizoaffective disorder (HCC) [F25.9] 09/30/2017  . 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 Essential hypertension [I10]   . Dyslipidemia [E78.5]   . Chest pain on exertion [R07.9]   . Stable angina (HCC) [I20.8] 06/17/2017    Total Time spent with patient: 30 minutes  Musculoskeletal: Strength & Muscle Tone: within normal limits Gait & Station: normal Patient leans: N/A  Psychiatric Specialty Exam: ROS no headache,no chest pain, no shortness of breath, no vomiting   Blood pressure 114/67, pulse 75, temperature 98.4 F (36.9 C), temperature source Oral, resp. rate 20, height 6' (1.829 m), weight 83.9 kg, SpO2 94 %.Body mass index is 25.09 kg/m.  General Appearance: Fairly Groomed  Patent attorneyye Contact::  Fair  Speech:  Normal Rate409  Volume:  Normal  Mood:  reports improving mood   Affect:  remains constricted  Thought Process:  Linear and Descriptions of Associations: Intact  Orientation:  Full (Time, Place, and Person)  Thought Content:  denies hallucinations, no delusions,not internally preoccupied   Suicidal Thoughts:  No denies suicidal or self injurious ideations, no homicidal or violent ideations   Homicidal Thoughts:  No  Memory:  recent and remote grossly intact   Judgement:  Other:  improving  Insight:  improving   Psychomotor Activity:  Normal  Concentration:  Good  Recall:  Good  Fund of Knowledge:Good  Language: Good  Akathisia:  Negative  Handed:  Right  AIMS (if indicated):     Assets:  Desire for Improvement Resilience  Sleep:  Number of Hours: 6.75  Cognition: WNL  ADL's:  Intact   Mental Status Per Nursing Assessment::   On Admission:  Suicidal ideation indicated by patient, Self-harm thoughts  Demographic Factors:  52, divorced, was living  alone, CopyMilitary veteran,on VA disability  Loss Factors: Limited local support system, disability  Historical Factors: History of depression, history of prior psychiatric admissions, history of suicide attempts, history of alcohol and cocaine use disorder   Risk Reduction Factors:   Positive coping skills or problem solving skills  Continued Clinical Symptoms:  Patient presents alert, attentive, calm, reports mood is improved and that he feels " a lot better" than before admission. Affect remains vaguely constricted No thought disorder, no suicidal or self injurious ideations, no homicidal or violent ideations, no psychotic symptoms. No disruptive or agitated behaviors on unit, polite on approach. Denies medication side effects.  Cognitive Features That Contribute To Risk:  No gross cognitive deficits noted upon discharge. Is alert , attentive, and oriented x 3   Suicide Risk:  Mild:  Suicidal ideation of limited frequency, intensity, duration, and specificity.  There are no identifiable plans, no associated intent, mild dysphoria and related symptoms, good self-control (both objective and subjective assessment), few other risk factors, and identifiable protective factors, including available and accessible social support.  Follow-up Information    [facility].   Why:  You have been rescheduled for Monday 02/01/18 at 9:00AM withe Dr. Yancey Flemingsastillo. If you would like to be seen for mental health sooner, you may also walk into the clinic Mon-Fri between 8am-3pm to be assessed. Thank you.  Contact information: [address] [place], [place] [address] Phone: [phone] ext. [phone] Fax: [phone]       Services, [facility] Follow up on 10/13/2017.  Why:  Please walk in to be screened for possible placement on Wed, 9/17 at 7:45AM.You will need: photo ID, 2 week supply of medications, and clothing. Thank you.  Contact information: [address] [place] [address] [phone]           Plan Of Care/Follow-up recommendations:  Activity:  as tolerated Diet:  herat healthy, diabetic diet  Tests:  NA Other:  see below  Patient is planning on presenting to [facility] residential rehab early tomorrow AM for intake- leaves unit early in the morning . Plans to continue medical and psychiatric care at [facility]- has an established PCP and psychiatrist there.   Craige Cotta, MD 10/13/2017, 3:19 PM

## 2017-10-13 NOTE — Progress Notes (Signed)
D: Pt was in bed in his room upon initial approach.  Mood remains depressed with congruent affect.  Continues to forward little information and isolate to room.  Denies having a goal so Probation officer and pt made goal for pt to "be safe."  Describes his day as "okay."  Pt is discharging to [facility] in the morning and he reports he feels safe with this plan.  Pt denies SI/HI, denies hallucinations, reports bilateral leg pain of 5/10.  Pt did not attend evening group.  He came out of his room for HS medications.  A: Met with pt 1:1.  Actively listened to pt and offered support and encouragement. Medications administered per order.  PRN medication administered for pain.  Q15 minute safety checks maintained.  R: Pt is safe on the unit.  Pt is compliant with medications.  Pt verbally contracts for safety.  Will continue to monitor and assess.

## 2017-10-14 LAB — GLUCOSE, CAPILLARY: Glucose-Capillary: 70 mg/dL (ref 70–99)

## 2017-10-14 NOTE — Progress Notes (Signed)
Pt ambulatory, alert, and oriented X4.  He denies SI/HI, hallucinations, and pain.  Belongings returned to pt.  Pt provided with discharge paperwork, medication samples, and prescriptions.  Pt denies needs and concerns.  Pt provided with breakfast tray.  Pt discharged to lobby [facility].

## 2017-10-14 NOTE — Progress Notes (Signed)
CSW received voicemail from pt's sister, Marcus Cummings (pt's sister) [phone], regarding pt not getting [facility] due to being on insulin. CSW attempted to contact pt at number provided in chart--unable to leave voicemail; and attempted to contact pt's sister back regarding her concern. CSW explained to pt prior to discharge that he must attend screening where they would go over his medication and determine if they could accept him with the possibility of not being able to take him. ([facility] sometimes cannot take patients on specific medications or on insulin if they do not have a provider that can manage it but this depends on their current staffing). Also, pt was instructed [facility] [facility] [place] [place].   [name] [name], MSW, LCSW Clinical Social Worker 10/14/2017 3:34 PM

## 2017-12-28 ENCOUNTER — Emergency Department (HOSPITAL_COMMUNITY)
Admission: EM | Admit: 2017-12-28 | Discharge: 2017-12-29 | Disposition: A | Discharge status: Other Acute Inpatient Hospital | Payer: Non-veteran care | Attending: Emergency Medicine | Admitting: Emergency Medicine

## 2017-12-28 ENCOUNTER — Encounter (HOSPITAL_COMMUNITY): Payer: Self-pay | Admitting: Emergency Medicine

## 2017-12-28 ENCOUNTER — Emergency Department (HOSPITAL_COMMUNITY): Payer: Non-veteran care

## 2017-12-28 DIAGNOSIS — I252 Old myocardial infarction: Secondary | ICD-10-CM | POA: Insufficient documentation

## 2017-12-28 DIAGNOSIS — Z7982 Long term (current) use of aspirin: Secondary | ICD-10-CM | POA: Diagnosis not present

## 2017-12-28 DIAGNOSIS — R531 Weakness: Secondary | ICD-10-CM | POA: Diagnosis not present

## 2017-12-28 DIAGNOSIS — E119 Type 2 diabetes mellitus without complications: Secondary | ICD-10-CM | POA: Diagnosis not present

## 2017-12-28 DIAGNOSIS — Z79899 Other long term (current) drug therapy: Secondary | ICD-10-CM | POA: Diagnosis not present

## 2017-12-28 DIAGNOSIS — R45851 Suicidal ideations: Secondary | ICD-10-CM | POA: Insufficient documentation

## 2017-12-28 DIAGNOSIS — Z951 Presence of aortocoronary bypass graft: Secondary | ICD-10-CM | POA: Diagnosis not present

## 2017-12-28 DIAGNOSIS — R202 Paresthesia of skin: Secondary | ICD-10-CM | POA: Diagnosis not present

## 2017-12-28 DIAGNOSIS — F259 Schizoaffective disorder, unspecified: Secondary | ICD-10-CM | POA: Insufficient documentation

## 2017-12-28 DIAGNOSIS — R200 Anesthesia of skin: Secondary | ICD-10-CM | POA: Diagnosis present

## 2017-12-28 DIAGNOSIS — Z794 Long term (current) use of insulin: Secondary | ICD-10-CM | POA: Insufficient documentation

## 2017-12-28 DIAGNOSIS — I10 Essential (primary) hypertension: Secondary | ICD-10-CM | POA: Insufficient documentation

## 2017-12-28 LAB — I-STAT CHEM 8, ED
BUN: 20 mg/dL (ref 6–20)
CREATININE: 0.8 mg/dL (ref 0.61–1.24)
Calcium, Ion: 1.06 mmol/L — ABNORMAL LOW (ref 1.15–1.40)
Chloride: 103 mmol/L (ref 98–111)
Glucose, Bld: 309 mg/dL — ABNORMAL HIGH (ref 70–99)
HEMATOCRIT: 35 % — AB (ref 39.0–52.0)
HEMOGLOBIN: 11.9 g/dL — AB (ref 13.0–17.0)
Potassium: 3.6 mmol/L (ref 3.5–5.1)
Sodium: 133 mmol/L — ABNORMAL LOW (ref 135–145)
TCO2: 22 mmol/L (ref 22–32)

## 2017-12-28 LAB — COMPREHENSIVE METABOLIC PANEL
ALT: 32 U/L (ref 0–44)
AST: 35 U/L (ref 15–41)
Albumin: 3.5 g/dL (ref 3.5–5.0)
Alkaline Phosphatase: 67 U/L (ref 38–126)
Anion gap: 13 (ref 5–15)
BILIRUBIN TOTAL: 1.3 mg/dL — AB (ref 0.3–1.2)
BUN: 19 mg/dL (ref 6–20)
CALCIUM: 8.7 mg/dL — AB (ref 8.9–10.3)
CO2: 21 mmol/L — ABNORMAL LOW (ref 22–32)
Chloride: 99 mmol/L (ref 98–111)
Creatinine, Ser: 1.09 mg/dL (ref 0.61–1.24)
GFR calc Af Amer: >60 mL/min (ref >60)
Glucose, Bld: 298 mg/dL — ABNORMAL HIGH (ref 70–99)
Potassium: 3.7 mmol/L (ref 3.5–5.1)
Sodium: 133 mmol/L — ABNORMAL LOW (ref 135–145)
Total Protein: 6.8 g/dL (ref 6.5–8.1)

## 2017-12-28 LAB — CBG MONITORING, ED
GLUCOSE-CAPILLARY: 390 mg/dL — AB (ref 70–99)
Glucose-Capillary: 285 mg/dL — ABNORMAL HIGH (ref 70–99)
Glucose-Capillary: 313 mg/dL — ABNORMAL HIGH (ref 70–99)
Glucose-Capillary: 222 mg/dL — ABNORMAL HIGH (ref 70–99)

## 2017-12-28 LAB — DIFFERENTIAL
ABS IMMATURE GRANULOCYTES: 0.01 10*3/uL (ref 0.00–0.07)
Basophils Absolute: 0 10*3/uL (ref 0.0–0.1)
Basophils Relative: 1 %
Eosinophils Absolute: 0.1 10*3/uL (ref 0.0–0.5)
Eosinophils Relative: 2 %
Immature Granulocytes: 0 %
LYMPHS ABS: 1.3 10*3/uL (ref 0.7–4.0)
LYMPHS PCT: 24 %
Monocytes Absolute: 0.6 10*3/uL (ref 0.1–1.0)
Monocytes Relative: 12 %
NEUTROS ABS: 3.2 10*3/uL (ref 1.7–7.7)
Neutrophils Relative %: 61 %

## 2017-12-28 LAB — CBC
HCT: 36 % — ABNORMAL LOW (ref 39.0–52.0)
HEMOGLOBIN: 11.8 g/dL — AB (ref 13.0–17.0)
MCH: 29.2 pg (ref 26.0–34.0)
MCHC: 32.8 g/dL (ref 30.0–36.0)
MCV: 89.1 fL (ref 80.0–100.0)
Platelets: 236 10*3/uL (ref 150–400)
RBC: 4.04 MIL/uL — AB (ref 4.22–5.81)
RDW: 15.2 % (ref 11.5–15.5)
WBC: 5.2 10*3/uL (ref 4.0–10.5)
nRBC: 0 % (ref 0.0–0.2)

## 2017-12-28 LAB — APTT: aPTT: 27 seconds (ref 24–36)

## 2017-12-28 LAB — PROTIME-INR
INR: 1.12
Prothrombin Time: 14.3 seconds (ref 11.4–15.2)

## 2017-12-28 LAB — I-STAT TROPONIN, ED: TROPONIN I, POC: 0.01 ng/mL (ref 0.00–0.08)

## 2017-12-28 LAB — ETHANOL: Alcohol, Ethyl (B): <10 mg/dL (ref <10)

## 2017-12-28 MED ORDER — METFORMIN HCL 500 MG PO TABS
1000.0000 mg | ORAL_TABLET | Freq: Two times a day (BID) | ORAL | Status: DC
  Administered 2017-12-28: 1000 mg via ORAL
  Filled 2017-12-28: qty 2

## 2017-12-28 MED ORDER — LORAZEPAM 2 MG/ML IJ SOLN
1.0000 mg | Freq: Once | INTRAMUSCULAR | Status: AC
  Administered 2017-12-28: 1 mg via INTRAVENOUS
  Filled 2017-12-28: qty 1

## 2017-12-28 MED ORDER — ASPIRIN EC 325 MG PO TBEC
325.0000 mg | DELAYED_RELEASE_TABLET | Freq: Every day | ORAL | Status: DC
  Administered 2017-12-28: 325 mg via ORAL
  Filled 2017-12-28: qty 1

## 2017-12-28 MED ORDER — METOPROLOL TARTRATE 25 MG PO TABS
50.0000 mg | ORAL_TABLET | Freq: Two times a day (BID) | ORAL | Status: DC
  Administered 2017-12-28 (×2): 50 mg via ORAL
  Filled 2017-12-28 (×2): qty 2

## 2017-12-28 MED ORDER — ATORVASTATIN CALCIUM 80 MG PO TABS
80.0000 mg | ORAL_TABLET | Freq: Every day | ORAL | Status: DC
  Administered 2017-12-28: 80 mg via ORAL
  Filled 2017-12-28: qty 1

## 2017-12-28 MED ORDER — LISINOPRIL 2.5 MG PO TABS
5.0000 mg | ORAL_TABLET | Freq: Every day | ORAL | Status: DC
  Administered 2017-12-28: 5 mg via ORAL
  Filled 2017-12-28: qty 1

## 2017-12-28 MED ORDER — INSULIN ASPART 100 UNIT/ML ~~LOC~~ SOLN
0.0000 [IU] | Freq: Every day | SUBCUTANEOUS | Status: DC
  Administered 2017-12-28: 2 [IU] via SUBCUTANEOUS

## 2017-12-28 MED ORDER — SERTRALINE HCL 50 MG PO TABS
75.0000 mg | ORAL_TABLET | Freq: Every day | ORAL | Status: DC
  Administered 2017-12-28: 75 mg via ORAL
  Filled 2017-12-28: qty 1

## 2017-12-28 MED ORDER — POTASSIUM CHLORIDE CRYS ER 20 MEQ PO TBCR
20.0000 meq | EXTENDED_RELEASE_TABLET | Freq: Every day | ORAL | Status: DC
  Administered 2017-12-28: 20 meq via ORAL
  Filled 2017-12-28: qty 1

## 2017-12-28 MED ORDER — NALTREXONE HCL 50 MG PO TABS
50.0000 mg | ORAL_TABLET | Freq: Every day | ORAL | Status: DC
  Administered 2017-12-28: 50 mg via ORAL
  Filled 2017-12-28 (×2): qty 1

## 2017-12-28 MED ORDER — MELATONIN 3 MG PO TABS
6.0000 mg | ORAL_TABLET | Freq: Every evening | ORAL | Status: DC | PRN
  Filled 2017-12-28: qty 2

## 2017-12-28 MED ORDER — FUROSEMIDE 20 MG PO TABS
40.0000 mg | ORAL_TABLET | Freq: Every day | ORAL | Status: DC
  Administered 2017-12-28: 40 mg via ORAL
  Filled 2017-12-28: qty 2

## 2017-12-28 MED ORDER — INSULIN ASPART 100 UNIT/ML ~~LOC~~ SOLN
0.0000 [IU] | Freq: Three times a day (TID) | SUBCUTANEOUS | Status: DC
  Administered 2017-12-28: 15 [IU] via SUBCUTANEOUS

## 2017-12-28 MED ORDER — TRAZODONE HCL 50 MG PO TABS
150.0000 mg | ORAL_TABLET | Freq: Every day | ORAL | Status: DC
  Administered 2017-12-28: 150 mg via ORAL
  Filled 2017-12-28: qty 1

## 2017-12-28 NOTE — Progress Notes (Signed)
Inpatient Diabetes Program Recommendations  AACE/ADA: New Consensus Statement on Inpatient Glycemic Control (2015)  Target Ranges:  Prepandial:   less than 140 mg/dL      Peak postprandial:   less than 180 mg/dL (1-2 hours)      Critically ill patients:  140 - 180 mg/dL   Lab Results  Component Value Date   GLUCAP 285 (H) 12/28/2017   HGBA1C 10.0 (H) 10/01/2017    Review of Glycemic ControlResults for Marcus Cummings, Marcus Cummings. (MRN [id number]) as of 12/28/2017 13:38  Ref. Range 12/28/2017 05:02  Glucose-Capillary Latest Ref Range: 70 - 99 mg/dL 409285 (H)    Diabetes history: DM 2 Outpatient Diabetes medications:  Lantus 30 units daily, Metformin 1000 mg bid Current orders for Inpatient glycemic control:  Novolog moderate tid with meals and HS, Metformin 1000 mg bid Inpatient Diabetes Program Recommendations:   May consider restarting a portion of patient's home Lantus.  Consider adding Lantus 20 units q HS.   Thanks,  Beryl MeagerJenny Dillyn Joaquin, RN, BC-ADM Inpatient Diabetes Coordinator Pager [phone] (8a-5p)

## 2017-12-28 NOTE — ED Notes (Signed)
Called to MRI due to patient now reporting he is claustrophobic. MD Jacubowitz ordered 1 mg ativan IV, received verbally.

## 2017-12-28 NOTE — BH Assessment (Addendum)
Tele Assessment Note   Patient Name: Marcus MAZARIEGO Sr. MRN: [id number] Referring Physician: Blinda Leatherwood Location of Patient: [facility] ED Location of Provider: Behavioral Health TTS Department  Marcus Bathe Sr. is an 52 y.o. male.  The pt came in, because he thought he was having a stroke.  The pt expressed he is suicidal with a plan to overdose on pills.  The pt denies any major stressors.  He has a past history of overdosing on pills 5-6 years ago.  He denied going to the hospital at that time.  He currently goes to the [place] for therapy and medication management.  He was last at the [place] a week ago.  The pt denies any recent hospitalizations.  The pt lives alone.  He denies self harm, HI, legal issues, history of abuse and hallucinations.  He stated he isn't sleeping or eating well.  The pt uses about 2 grams a week and last used 2 days ago.  The pt reported feeling hopeless.  Pt is dressed in scrubs. He is drowsy and oriented x4. Pt speaks in a slurred tone, at a low volume and normal pace. Eye contact is poor. Pt's mood is depressed. Thought process is coherent and relevant. There is no indication Pt is currently responding to internal stimuli or experiencing delusional thought content.?Pt was cooperative throughout assessment.    Diagnosis: F33.2 Major depressive disorder, Recurrent episode, Severe  F14.20 Cocaine use disorder, Moderate   Past Medical History:  Past Medical History:  Diagnosis Date  . Diabetes mellitus without complication (HCC)   . Hypertension   . MI, acute, non ST segment elevation (HCC) 2007   stents x 2 2007 [facility] [place])  . Stroke [facility]) 2017    Past Surgical History:  Procedure Laterality Date  . CORONARY ARTERY BYPASS GRAFT N/A 06/23/2017   Procedure: CORONARY ARTERY BYPASS GRAFTING (CABG) x 3; Using Left Internal Mammary Artery and Right Leg Greater Saphenous Vein harvested endoscopically.;  Surgeon: Kerin Perna, MD;  Location: [facility] OR;  Service: Open Heart  Surgery;  Laterality: N/A;  . LEFT HEART CATH AND CORONARY ANGIOGRAPHY N/A 06/18/2017   Procedure: LEFT HEART CATH AND CORONARY ANGIOGRAPHY;  Surgeon: Lennette Bihari, MD;  Location: [facility] INVASIVE CV LAB;  Service: Cardiovascular;  Laterality: N/A;  . TEE WITHOUT CARDIOVERSION N/A 06/23/2017   Procedure: TRANSESOPHAGEAL ECHOCARDIOGRAM (TEE);  Surgeon: Donata Clay, Theron Arista, MD;  Location: [facility] OR;  Service: Open Heart Surgery;  Laterality: N/A;    Family History:  Family History  Problem Relation Age of Onset  . CAD Mother   . Congestive Heart Failure Father     Social History:  reports that he has been smoking cigarettes. He has never used smokeless tobacco. He reports that he drinks alcohol. He reports that he has current or past drug history. Drugs: Marijuana and Cocaine.  Additional Social History:  Alcohol / Drug Use Pain Medications: See MAR Prescriptions: See MAR Over the Counter: See MAR History of alcohol / drug use?: Yes Longest period of sobriety (when/how long): unknown Negative Consequences of Use: Financial Substance #1 Name of Substance 1: cocaine 1 - Age of First Use: unknown 1 - Amount (size/oz): couple of grams 1 - Frequency: a couple of times a week 1 - Duration: unknown 1 - Last Use / Amount: 2 days ago Substance #2 Name of Substance 2: alcohol 2 - Age of First Use: unknown 2 - Amount (size/oz): 1-2 40 oz beers 2 - Frequency: once a week 2 -  Last Use / Amount: "last Wednesday"  CIWA: CIWA-Ar BP: (!) 153/97 Pulse Rate: 92 COWS:    Allergies:  Allergies  Allergen Reactions  . Zyprexa [Olanzapine] Other (See Comments)    unknown    Home Medications:  (Not in a hospital admission)  OB/GYN Status:  No LMP for male patient.  General Assessment Data Location of Assessment: [facility] ED TTS Assessment: In system Is this a Tele or Face-to-Face Assessment?: Face-to-Face Is this an Initial Assessment or a Re-assessment for this encounter?: Initial Assessment Patient  Accompanied by:: N/A Language Other than English: No Living Arrangements: Other (Comment)(home) What gender do you identify as?: Male Marital status: Divorced Jordan name: NA Pregnancy Status: No Living Arrangements: Alone Can pt return to current living arrangement?: Yes Admission Status: Voluntary Is patient capable of signing voluntary admission?: Yes Referral Source: Self/Family/Friend Insurance type: VA     Crisis Care Plan Living Arrangements: Alone Legal Guardian: Other:(self) Name of Psychiatrist: [facility] Name of Therapist: VA  Education Status Is patient currently in school?: No Is the patient employed, unemployed or receiving disability?: Unemployed  Risk to self with the past 6 months Suicidal Ideation: Yes-Currently Present Has patient been a risk to self within the past 6 months prior to admission? : Yes Suicidal Intent: No Has patient had any suicidal intent within the past 6 months prior to admission? : No Is patient at risk for suicide?: Yes Suicidal Plan?: Yes-Currently Present Has patient had any suicidal plan within the past 6 months prior to admission? : Yes Specify Current Suicidal Plan: overdose on pills Access to Means: Yes Specify Access to Suicidal Means: pt can get pills What has been your use of drugs/alcohol within the last 12 months?: cocaine use Previous Attempts/Gestures: Yes How many times?: 1 Other Self Harm Risks: none Triggers for Past Attempts: Unpredictable Intentional Self Injurious Behavior: None Family Suicide History: No Recent stressful life event(s): Other (Comment)(denies major stressors) Persecutory voices/beliefs?: No Depression: Yes Depression Symptoms: Despondent, Insomnia, Loss of interest in usual pleasures Substance abuse history and/or treatment for substance abuse?: Yes Suicide prevention information given to non-admitted patients: Not applicable  Risk to Others within the past 6 months Homicidal Ideation: No Does  patient have any lifetime risk of violence toward others beyond the six months prior to admission? : No Thoughts of Harm to Others: No Current Homicidal Intent: No Current Homicidal Plan: No Access to Homicidal Means: No Identified Victim: none History of harm to others?: No Assessment of Violence: None Noted Violent Behavior Description: none Does patient have access to weapons?: No Criminal Charges Pending?: No Does patient have a court date: No Is patient on probation?: No  Psychosis Hallucinations: None noted Delusions: None noted  Mental Status Report Appearance/Hygiene: Unremarkable, In scrubs Eye Contact: Poor Motor Activity: Freedom of movement Speech: Soft, Slurred Level of Consciousness: Drowsy Mood: Depressed Affect: Depressed Anxiety Level: None Thought Processes: Coherent, Relevant Judgement: Impaired Orientation: Person, Place, Time, Situation Obsessive Compulsive Thoughts/Behaviors: None  Cognitive Functioning Concentration: Normal Memory: Recent Intact, Remote Intact Is patient IDD: No Insight: Poor Impulse Control: Poor Appetite: Poor Have you had any weight changes? : No Change Sleep: Decreased Total Hours of Sleep: 5 Vegetative Symptoms: None  ADLScreening [facility] Assessment Services) Patient's cognitive ability adequate to safely complete daily activities?: Yes Patient able to express need for assistance with ADLs?: Yes Independently performs ADLs?: Yes (appropriate for developmental age)  Prior Inpatient Therapy Prior Inpatient Therapy: No  Prior Outpatient Therapy Prior Outpatient Therapy: Yes Prior Therapy  Dates: current [facility] Reason for Treatment: depression Does patient have an ACCT team?: No Does patient have Intensive In-House Services?  : No Does patient have [facility] services? : No Does patient have P4CC services?: No  ADL Screening (condition at time of admission) Patient's cognitive ability  adequate to safely complete daily activities?: Yes Patient able to express need for assistance with ADLs?: Yes Independently performs ADLs?: Yes (appropriate for developmental age)       Abuse/Neglect Assessment (Assessment to be complete while patient is alone) Abuse/Neglect Assessment Can Be Completed: Yes Physical Abuse: Denies Verbal Abuse: Denies Sexual Abuse: Denies Exploitation of patient/patient's resources: Denies Self-Neglect: Denies Values / Beliefs Cultural Requests During Hospitalization: None Spiritual Requests During Hospitalization: None Consults Spiritual Care Consult Needed: No Social Work Consult Needed: No            Disposition:  Disposition Initial Assessment Completed for this Encounter: Yes   NP Nanine MeansJamison Lord recommends inpatient treatment.  RN Irving Burtonmily was made aware of the recommendation.  This service was provided via telemedicine using a 2-way, interactive audio and video technology.  Names of all persons participating in this telemedicine service and their role in this encounter. Name: Marcus Cummings Role: Pt  Name: Marcus Cummings Role: TTS  Name:  Role:   Name:  Role:     Marcus Cummings, Marcus Cummings 12/28/2017 6:16 PM

## 2017-12-28 NOTE — ED Provider Notes (Signed)
[facility] Provider Note   CSN: [id number] Arrival date & time: 12/28/17  0458     History   Chief Complaint Chief Complaint  Patient presents with  . Weakness    HPI Marcus BatheGregory L Linhares Sr. is a 52 y.o. male.  Patient presents for evaluation of possible stroke.  Patient reports for the last 2 days he has felt like his left side has been numb, tingly and weak.  Patient reports a previous stroke with left-sided deficits, symptoms have been significantly worse over the last 2 days.  He has not had any headache.  Patient does report that he has felt generalized weakness as well, having trouble walking because he is off balance and his legs are weak.     Past Medical History:  Diagnosis Date  . Diabetes mellitus without complication (HCC)   . Hypertension   . MI, acute, non ST segment elevation (HCC) 2007   stents x 2 2007 [facility]  . Stroke [facility](HCC) 2017    Patient Active Problem List   Diagnosis Date Noted  . Cocaine abuse (HCC) 10/10/2017  . Schizoaffective disorder (HCC) 09/30/2017  . 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 Essential hypertension   . Dyslipidemia   . Chest pain on exertion   . Stable angina (HCC) 06/17/2017    Past Surgical History:  Procedure Laterality Date  . CORONARY ARTERY BYPASS GRAFT N/A 06/23/2017   Procedure: CORONARY ARTERY BYPASS GRAFTING (CABG) x 3; Using Left Internal Mammary Artery and Right Leg Greater Saphenous Vein harvested endoscopically.;  Surgeon: Kerin PernaVan Trigt, Peter, MD;  [facility];  Service: Open Heart Surgery;  Laterality: N/A;  . LEFT HEART CATH AND CORONARY ANGIOGRAPHY N/A 06/18/2017   Procedure: LEFT HEART CATH AND CORONARY ANGIOGRAPHY;  Surgeon: Lennette BihariKelly, Thomas A, MD;  Location: [facility] INVASIVE CV LAB;  Service: Cardiovascular;  Laterality: N/A;  . TEE WITHOUT CARDIOVERSION N/A 06/23/2017   Procedure: TRANSESOPHAGEAL ECHOCARDIOGRAM (TEE);  Surgeon: Donata ClayVan Trigt, Theron AristaPeter, MD;  [facility];  Service: Open  Heart Surgery;  Laterality: N/A;        Home Medications    Prior to Admission medications   Medication Sig Start Date End Date Taking? Authorizing Provider  aspirin EC 325 MG EC tablet Take 1 tablet (325 mg total) by mouth daily. 07/02/17  Yes Gold, Wayne E, PA-C  atorvastatin (LIPITOR) 80 MG tablet Take 1 tablet (80 mg total) by mouth daily at 6 PM. 10/13/17  Yes Money, Gerlene Burdockravis B, FNP  furosemide (LASIX) 40 MG tablet Take 1 tablet (40 mg total) by mouth daily. 10/13/17  Yes Money, Gerlene Burdockravis B, FNP  insulin glargine (LANTUS) 100 UNIT/ML injection Inject 0.3 mLs (30 Units total) into the skin at bedtime. 10/13/17  Yes Money, Gerlene Burdockravis B, FNP  lisinopril (PRINIVIL,ZESTRIL) 5 MG tablet Take 1 tablet (5 mg total) by mouth daily. For high blood pressure 10/14/17  Yes Money, Gerlene Burdockravis B, FNP  Melatonin 3 MG TABS Take 2 tablets (6 mg total) by mouth at bedtime as needed (for sleep). 10/13/17  Yes Money, Gerlene Burdockravis B, FNP  metFORMIN (GLUCOPHAGE) 1000 MG tablet Take 1 tablet (1,000 mg total) by mouth 2 (two) times daily with a meal. 10/13/17  Yes Money, Gerlene Burdockravis B, FNP  metoprolol tartrate (LOPRESSOR) 50 MG tablet Take 1 tablet (50 mg total) by mouth 2 (two) times daily. 10/13/17  Yes Money, Gerlene Burdockravis B, FNP  naltrexone (DEPADE) 50 MG tablet Take 1 tablet (50 mg total) by mouth daily. 10/14/17  Yes Money, Gerlene Burdock, FNP  paliperidone (INVEGA SUSTENNA) 234 MG/1.5ML SUSY injection Inject 234 mg into the muscle every 28 (twenty-eight) days. 11/04/17  Yes Money, Gerlene Burdock, FNP  potassium chloride SA (K-DUR,KLOR-CON) 20 MEQ tablet Take 1 tablet (20 mEq total) by mouth daily. 10/14/17  Yes Money, Gerlene Burdock, FNP  sertraline (ZOLOFT) 25 MG tablet Take 3 tablets (75 mg total) by mouth daily. For mood ocntrol 10/14/17  Yes Money, Gerlene Burdock, FNP  traZODone (DESYREL) 150 MG tablet Take 1 tablet (150 mg total) by mouth at bedtime. For mood control 10/13/17  Yes Money, Gerlene Burdock, FNP  paliperidone (INVEGA) 3 MG 24 hr tablet Take 1 tablet (3 mg total)  by mouth at bedtime for 21 days. Stop use the day before next [place] injection Patient not taking: Reported on 12/28/2017 10/13/17 12/28/25  Money, Gerlene Burdock, FNP    Family History Family History  Problem Relation Age of Onset  . CAD Mother   . Congestive Heart Failure Father     Social History Social History   Tobacco Use  . Smoking status: Current Every Day Smoker    Types: Cigarettes  . Smokeless tobacco: Never Used  Substance Use Topics  . Alcohol use: Yes    Comment: 1-2  beers a week  . Drug use: Yes    Types: Marijuana, Cocaine     Allergies   Zyprexa [olanzapine]   Review of Systems Review of Systems  Neurological: Positive for weakness and numbness. Negative for headaches.  All other systems reviewed and are negative.    Physical Exam Updated Vital Signs BP (!) 143/91   Pulse 100   Temp 98 F (36.7 C) (Oral)   Resp 18   Ht 6' (1.829 m)   Wt 93 kg   SpO2 100%   BMI 27.80 kg/m   Physical Exam  Constitutional: He is oriented to person, place, and time. He appears well-developed and well-nourished. No distress.  HENT:  Head: Normocephalic and atraumatic.  Right Ear: Hearing normal.  Left Ear: Hearing normal.  Nose: Nose normal.  Mouth/Throat: Oropharynx is clear and moist and mucous membranes are normal.  Eyes: Pupils are equal, round, and reactive to light. Conjunctivae and EOM are normal.  Neck: Normal range of motion. Neck supple.  Cardiovascular: Regular rhythm, S1 normal and S2 normal. Exam reveals no gallop and no friction rub.  No murmur heard. Pulmonary/Chest: Effort normal and breath sounds normal. No respiratory distress. He exhibits no tenderness.  Abdominal: Soft. Normal appearance and bowel sounds are normal. There is no hepatosplenomegaly. There is no tenderness. There is no rebound, no guarding, no tenderness at McBurney's point and negative Murphy's sign. No hernia.  Musculoskeletal: He exhibits no edema or deformity.    Neurological: He is alert and oriented to person, place, and time. No cranial nerve deficit. GCS eye subscore is 4. GCS verbal subscore is 5. GCS motor subscore is 6.  Decreased sensation to light touch left upper extremity  Both lower extremities are very weak, approximately equal strength bilaterally  Patient reports bilateral numbness and weakness in lower extremities  Skin: Skin is warm, dry and intact. No rash noted. No cyanosis.  Psychiatric: He has a normal mood and affect. His speech is normal and behavior is normal. Thought content normal.  Nursing note and vitals reviewed.    ED Treatments / Results  Labs (all labs ordered are listed, but only abnormal results are displayed) Labs Reviewed  CBC - Abnormal; Notable for  the following components:      Result Value   RBC 4.04 (*)    Hemoglobin 11.8 (*)    HCT 36.0 (*)    All other components within normal limits  COMPREHENSIVE METABOLIC PANEL - Abnormal; Notable for the following components:   Sodium 133 (*)    CO2 21 (*)    Glucose, Bld 298 (*)    Calcium 8.7 (*)    Total Bilirubin 1.3 (*)    All other components within normal limits  CBG MONITORING, ED - Abnormal; Notable for the following components:   Glucose-Capillary 285 (*)    All other components within normal limits  I-STAT CHEM 8, ED - Abnormal; Notable for the following components:   Sodium 133 (*)    Glucose, Bld 309 (*)    Calcium, Ion 1.06 (*)    Hemoglobin 11.9 (*)    HCT 35.0 (*)    All other components within normal limits  ETHANOL  PROTIME-INR  APTT  DIFFERENTIAL  RAPID URINE DRUG SCREEN, HOSP PERFORMED  URINALYSIS, ROUTINE W REFLEX MICROSCOPIC  I-STAT TROPONIN, ED    EKG None  Radiology Ct Head Wo Contrast  Result Date: 12/28/2017 CLINICAL DATA:  52 year old male with weakness. EXAM: CT HEAD WITHOUT CONTRAST TECHNIQUE: Contiguous axial images were obtained from the base of the skull through the vertex without intravenous contrast.  COMPARISON:  Head CT dated 06/19/2017 FINDINGS: Brain: The ventricles and sulci appropriate size for patient's age. Minimal periventricular and deep white matter chronic 5 microvascular ischemic changes noted. There is no acute intracranial hemorrhage. No mass effect or midline shift. No extra-axial fluid collection. Vascular: No hyperdense vessel or unexpected calcification. Skull: Normal. Negative for fracture or focal lesion. Sinuses/Orbits: No acute finding. Other: None IMPRESSION: No acute intracranial pathology. Electronically Signed   By: Elgie Collard M.D.   On: 12/28/2017 06:23    Procedures Procedures (including critical care time)  Medications Ordered in ED Medications - No data to display   Initial Impression / Assessment and Plan / ED Course  I have reviewed the triage vital signs and the nursing notes.  Pertinent labs & imaging results that were available during my care of the patient were reviewed by me and considered in my medical decision making (see chart for details).     Patient presents for evaluation of generalized weakness as well as left-sided weakness.  Patient reports that he has been feeling weak for 2 days.  Over the last 2 days he has noticed tingling, numbness of his left arm and left leg with weakness.  He has had a previous stroke but reports that he did not notice any significant residual deficits after the stroke.  His examination revealed some mild weakness of the left upper extremity, equal weakness of both lower extremities.  He does have subjective decreased sensation of the left arm but feels normal in both lower legs.  This is not clearly a focal deficit.  As he has had a previous stroke, will need stroke work-up.  Initial head CT did not show obvious findings.  Will perform MRI.  Suspect patient is experiencing generalized weakness with subjective worsening of his pre-existing deficit, but must rule out new CVA.  Will sign out to oncoming ER physician to  follow-up MRI.  He will be appropriate for discharge if MRI is negative, admission if MRI shows acute stroke.  Final Clinical Impressions(s) / ED Diagnoses   Final diagnoses:  Generalized weakness    ED Discharge Orders  None       Gilda Crease, MD 12/28/17 [phone]

## 2017-12-28 NOTE — ED Provider Notes (Addendum)
complains of bilateral leg pain from knees down tingling in his left arm for the past 2 or 3 days.  No focal weakness.  No chest pain.Marland Kitchen  He also reports feeling suicidal for the past 2 weeks, denies plan.  On exam alert Glasgow Coma Score 15 HEENT exam no facial asymmetry neck supple no bruit lungs clear to auscultation heart regular rate and rhythm abdomen nontender all 4 extremities without redness finger tenderness neurovascular intact.  Neurologic Glasgow Coma Score 15 gait normal Romberg normal pronator drift normal finger-to-nose normal.  DTRs symmetric bilaterally at knee jerk ankle jerk and biceps. ED ECG REPORT   Date: 12/28/2017  Rate: 80  Rhythm: normal sinus rhythm  QRS Axis: left  Intervals: normal  ST/T Wave abnormalities: nonspecific T wave changes  Conduction Disutrbances:none  Narrative Interpretation:   Old EKG Reviewed: unchanged No significant change from 09/29/2017 I have personally reviewed the EKG tracing and agree with the computerized printout as noted.  12 30 p.m. patient continues to acknowledge suicidal ideation.  No definite plan. Results for orders placed or performed during the hospital encounter of 12/28/17  Ethanol  Result Value Ref Range   Alcohol, Ethyl (B) <10 <10 mg/dL  Protime-INR  Result Value Ref Range   Prothrombin Time 14.3 11.4 - 15.2 seconds   INR 1.12   APTT  Result Value Ref Range   aPTT 27 24 - 36 seconds  CBC  Result Value Ref Range   WBC 5.2 4.0 - 10.5 K/uL   RBC 4.04 (L) 4.22 - 5.81 MIL/uL   Hemoglobin 11.8 (L) 13.0 - 17.0 g/dL   HCT 16.1 (L) 09.6 - 04.5 %   MCV 89.1 80.0 - 100.0 fL   MCH 29.2 26.0 - 34.0 pg   MCHC 32.8 30.0 - 36.0 g/dL   RDW 40.9 81.1 - 91.4 %   Platelets 236 150 - 400 K/uL   nRBC 0.0 0.0 - 0.2 %  Differential  Result Value Ref Range   Neutrophils Relative % 61 %   Neutro Abs 3.2 1.7 - 7.7 K/uL   Lymphocytes Relative 24 %   Lymphs Abs 1.3 0.7 - 4.0 K/uL   Monocytes Relative 12 %   Monocytes Absolute 0.6  0.1 - 1.0 K/uL   Eosinophils Relative 2 %   Eosinophils Absolute 0.1 0.0 - 0.5 K/uL   Basophils Relative 1 %   Basophils Absolute 0.0 0.0 - 0.1 K/uL   Immature Granulocytes 0 %   Abs Immature Granulocytes 0.01 0.00 - 0.07 K/uL  Comprehensive metabolic panel  Result Value Ref Range   Sodium 133 (L) 135 - 145 mmol/L   Potassium 3.7 3.5 - 5.1 mmol/L   Chloride 99 98 - 111 mmol/L   CO2 21 (L) 22 - 32 mmol/L   Glucose, Bld 298 (H) 70 - 99 mg/dL   BUN 19 6 - 20 mg/dL   Creatinine, Ser 7.82 0.61 - 1.24 mg/dL   Calcium 8.7 (L) 8.9 - 10.3 mg/dL   Total Protein 6.8 6.5 - 8.1 g/dL   Albumin 3.5 3.5 - 5.0 g/dL   AST 35 15 - 41 U/L   ALT 32 0 - 44 U/L   Alkaline Phosphatase 67 38 - 126 U/L   Total Bilirubin 1.3 (H) 0.3 - 1.2 mg/dL   GFR calc non Af Amer >60 >60 mL/min   GFR calc Af Amer >60 >60 mL/min   Anion gap 13 5 - 15  CBG monitoring, ED  Result Value Ref Range  Glucose-Capillary 285 (H) 70 - 99 mg/dL  I-Stat Chem 8, ED  Result Value Ref Range   Sodium 133 (L) 135 - 145 mmol/L   Potassium 3.6 3.5 - 5.1 mmol/L   Chloride 103 98 - 111 mmol/L   BUN 20 6 - 20 mg/dL   Creatinine, Ser 6.570.80 0.61 - 1.24 mg/dL   Glucose, Bld 846309 (H) 70 - 99 mg/dL   Calcium, Ion 9.621.06 (L) 1.15 - 1.40 mmol/L   TCO2 22 22 - 32 mmol/L   Hemoglobin 11.9 (L) 13.0 - 17.0 g/dL   HCT 95.235.0 (L) 84.139.0 - 32.452.0 %  I-stat troponin, ED  Result Value Ref Range   Troponin i, poc 0.01 0.00 - 0.08 ng/mL   Comment 3           Ct Head Wo Contrast  Result Date: 12/28/2017 CLINICAL DATA:  52 year old male with weakness. EXAM: CT HEAD WITHOUT CONTRAST TECHNIQUE: Contiguous axial images were obtained from the base of the skull through the vertex without intravenous contrast. COMPARISON:  Head CT dated 06/19/2017 FINDINGS: Brain: The ventricles and sulci appropriate size for patient's age. Minimal periventricular and deep white matter chronic 5 microvascular ischemic changes noted. There is no acute intracranial hemorrhage. No  mass effect or midline shift. No extra-axial fluid collection. Vascular: No hyperdense vessel or unexpected calcification. Skull: Normal. Negative for fracture or focal lesion. Sinuses/Orbits: No acute finding. Other: None IMPRESSION: No acute intracranial pathology. Electronically Signed   By: Elgie CollardArash  Radparvar M.D.   On: 12/28/2017 06:23   Mr Brain Wo Contrast  Result Date: 12/28/2017 CLINICAL DATA:  52 year old male with weakness, neurologic deficit greater than 6 hours. EXAM: MRI HEAD WITHOUT CONTRAST TECHNIQUE: Multiplanar, multiecho pulse sequences of the brain and surrounding structures were obtained without intravenous contrast. COMPARISON:  Head CT 12/28/2017, 06/19/2017. FINDINGS: Brain: No restricted diffusion to suggest acute infarction. No midline shift, mass effect, evidence of mass lesion, ventriculomegaly, extra-axial collection or acute intracranial hemorrhage. Cervicomedullary junction and pituitary are within normal limits. There is no definite signal abnormality in the brain. No cortical encephalomalacia or chronic cerebral blood products identified. Questionable asymmetric T2 and FLAIR hyperintensity at the right cerebral peduncle (series 5, image 11), but no convincing right hemisphere encephalomalacia. Vascular: Major intracranial vascular flow voids are preserved. Skull and upper cervical spine: Negative visible cervical spine. Normal bone marrow signal. Sinuses/Orbits: Mildly Disconjugate gaze, otherwise negative orbits. Paranasal sinuses and mastoids are stable and well pneumatized. Other: Visible internal auditory structures appear normal. Scalp and face soft tissues appear negative. IMPRESSION: No acute intracranial finding or convincing abnormality on noncontrast brain MRI. Electronically Signed   By: Odessa FlemingH  Hall M.D.   On: 12/28/2017 10:04  Lab work consistent with hyperglycemia otherwise normal.  He is medically cleared for psychiatric evaluation.  TTS consulted  Doug SouJacubowitz, Gustie Bobb,  MD 12/28/17 1357    Doug SouJacubowitz, Jaionna Weisse, MD 12/28/17 [phone]

## 2017-12-28 NOTE — Progress Notes (Signed)
Pt meets inpatient criteria per Nanine MeansJamison Lord, NP. [facility] and assessed that there are currently no male inpatient beds available. Referral information has been sent to the following hospitals for review:  [facility]  [facility]  [facility]  [facility]  [facility]  [facility]  [facility]  [facility]   Disposition will continue to assist with placement needs.  Wells GuilesSarah Sharad Vaneaton, LCSW, LCAS Disposition CSW [facility]S [phone]

## 2017-12-28 NOTE — ED Notes (Signed)
Patient transporting to MRI 

## 2017-12-28 NOTE — Progress Notes (Signed)
Patient came for medical issues.  He used cocaine two days ago, not sure why a TTS consult is placed, not even medically cleared.  Labs are not back yet and just went for a MRI.  Nanine MeansJamison Lord, PMHNP

## 2017-12-28 NOTE — ED Triage Notes (Addendum)
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 IV est 500cc NS bolus given. Pt reports using cocaine and Marijuana 2 days ago.

## 2017-12-28 NOTE — ED Notes (Signed)
Informed of need for urine sample

## 2017-12-28 NOTE — Progress Notes (Signed)
[facility]; bed [facility] Nanine MeansJamison Lord, NP is the accepting provider.   Dr. Jeannine KittenFarah is the accepting/attending provider.   Call report to [phone]   [facility].    Pt is voluntary, and can be transported by [facility].  [facility].   Wells GuilesSarah Tory Mckissack, LCSW, LCAS Disposition CSW [facility]/TTS [phone]

## 2017-12-28 NOTE — ED Notes (Signed)
Pt placed in wine colored scrubs 

## 2017-12-29 ENCOUNTER — Inpatient Hospital Stay (HOSPITAL_COMMUNITY)
Admission: AD | Admit: 2017-12-29 | Discharge: 2018-01-02 | DRG: 885 | Disposition: A | Discharge status: Home / Self Care | Payer: Federal, State, Local not specified - Other | Source: Intra-hospital | Attending: Psychiatry | Admitting: Psychiatry

## 2017-12-29 ENCOUNTER — Other Ambulatory Visit: Payer: Self-pay

## 2017-12-29 ENCOUNTER — Encounter (HOSPITAL_COMMUNITY): Payer: Self-pay | Admitting: *Deleted

## 2017-12-29 DIAGNOSIS — G4700 Insomnia, unspecified: Secondary | ICD-10-CM | POA: Diagnosis present

## 2017-12-29 DIAGNOSIS — Z951 Presence of aortocoronary bypass graft: Secondary | ICD-10-CM | POA: Diagnosis not present

## 2017-12-29 DIAGNOSIS — E119 Type 2 diabetes mellitus without complications: Secondary | ICD-10-CM | POA: Diagnosis present

## 2017-12-29 DIAGNOSIS — F1210 Cannabis abuse, uncomplicated: Secondary | ICD-10-CM | POA: Diagnosis present

## 2017-12-29 DIAGNOSIS — F1420 Cocaine dependence, uncomplicated: Secondary | ICD-10-CM | POA: Diagnosis not present

## 2017-12-29 DIAGNOSIS — Z794 Long term (current) use of insulin: Secondary | ICD-10-CM

## 2017-12-29 DIAGNOSIS — F209 Schizophrenia, unspecified: Secondary | ICD-10-CM | POA: Diagnosis not present

## 2017-12-29 DIAGNOSIS — F4310 Post-traumatic stress disorder, unspecified: Secondary | ICD-10-CM | POA: Diagnosis present

## 2017-12-29 DIAGNOSIS — Z8673 Personal history of transient ischemic attack (TIA), and cerebral infarction without residual deficits: Secondary | ICD-10-CM

## 2017-12-29 DIAGNOSIS — F332 Major depressive disorder, recurrent severe without psychotic features: Secondary | ICD-10-CM | POA: Diagnosis present

## 2017-12-29 DIAGNOSIS — I252 Old myocardial infarction: Secondary | ICD-10-CM | POA: Diagnosis not present

## 2017-12-29 DIAGNOSIS — F29 Unspecified psychosis not due to a substance or known physiological condition: Secondary | ICD-10-CM | POA: Diagnosis present

## 2017-12-29 DIAGNOSIS — Z79899 Other long term (current) drug therapy: Secondary | ICD-10-CM

## 2017-12-29 DIAGNOSIS — Z7982 Long term (current) use of aspirin: Secondary | ICD-10-CM | POA: Diagnosis not present

## 2017-12-29 DIAGNOSIS — R45851 Suicidal ideations: Secondary | ICD-10-CM | POA: Diagnosis present

## 2017-12-29 DIAGNOSIS — I10 Essential (primary) hypertension: Secondary | ICD-10-CM | POA: Diagnosis present

## 2017-12-29 DIAGNOSIS — F17210 Nicotine dependence, cigarettes, uncomplicated: Secondary | ICD-10-CM | POA: Diagnosis present

## 2017-12-29 DIAGNOSIS — F1410 Cocaine abuse, uncomplicated: Secondary | ICD-10-CM | POA: Diagnosis present

## 2017-12-29 HISTORY — DX: Depression, unspecified: F32.A

## 2017-12-29 HISTORY — DX: Major depressive disorder, single episode, unspecified: F32.9

## 2017-12-29 LAB — GLUCOSE, CAPILLARY
Glucose-Capillary: 281 mg/dL — ABNORMAL HIGH (ref 70–99)
Glucose-Capillary: 275 mg/dL — ABNORMAL HIGH (ref 70–99)
Glucose-Capillary: 202 mg/dL — ABNORMAL HIGH (ref 70–99)
Glucose-Capillary: 273 mg/dL — ABNORMAL HIGH (ref 70–99)
Glucose-Capillary: 319 mg/dL — ABNORMAL HIGH (ref 70–99)

## 2017-12-29 MED ORDER — INSULIN ASPART 100 UNIT/ML ~~LOC~~ SOLN
0.0000 [IU] | Freq: Every day | SUBCUTANEOUS | Status: DC
  Administered 2017-12-29: 4 [IU] via SUBCUTANEOUS
  Administered 2017-12-31: 2 [IU] via SUBCUTANEOUS

## 2017-12-29 MED ORDER — METOPROLOL TARTRATE 50 MG PO TABS
50.0000 mg | ORAL_TABLET | Freq: Two times a day (BID) | ORAL | Status: DC
  Administered 2017-12-29 – 2018-01-02 (×9): 50 mg via ORAL
  Filled 2017-12-29: qty 14
  Filled 2017-12-29 (×2): qty 1
  Filled 2017-12-29: qty 14
  Filled 2017-12-29: qty 2
  Filled 2017-12-29 (×3): qty 1
  Filled 2017-12-29: qty 2
  Filled 2017-12-29 (×2): qty 1
  Filled 2017-12-29: qty 2
  Filled 2017-12-29 (×4): qty 1

## 2017-12-29 MED ORDER — MELATONIN 3 MG PO TABS
6.0000 mg | ORAL_TABLET | Freq: Every evening | ORAL | Status: DC | PRN
  Administered 2018-01-01: 6 mg via ORAL
  Filled 2017-12-29 (×2): qty 2

## 2017-12-29 MED ORDER — SERTRALINE HCL 50 MG PO TABS
75.0000 mg | ORAL_TABLET | Freq: Every day | ORAL | Status: DC
  Administered 2017-12-29: 75 mg via ORAL
  Filled 2017-12-29 (×2): qty 1

## 2017-12-29 MED ORDER — FUROSEMIDE 40 MG PO TABS
40.0000 mg | ORAL_TABLET | Freq: Every day | ORAL | Status: DC
  Administered 2017-12-29 – 2018-01-02 (×5): 40 mg via ORAL
  Filled 2017-12-29: qty 1
  Filled 2017-12-29: qty 2
  Filled 2017-12-29: qty 7
  Filled 2017-12-29 (×5): qty 1

## 2017-12-29 MED ORDER — MAGNESIUM HYDROXIDE 400 MG/5ML PO SUSP: 30.0000 mL | Freq: Every day | ORAL | Status: DC | PRN

## 2017-12-29 MED ORDER — POTASSIUM CHLORIDE CRYS ER 20 MEQ PO TBCR
20.0000 meq | EXTENDED_RELEASE_TABLET | Freq: Every day | ORAL | Status: DC
  Administered 2017-12-29 – 2018-01-02 (×5): 20 meq via ORAL
  Filled 2017-12-29: qty 1
  Filled 2017-12-29: qty 7
  Filled 2017-12-29 (×6): qty 1

## 2017-12-29 MED ORDER — LISINOPRIL 5 MG PO TABS
5.0000 mg | ORAL_TABLET | Freq: Every day | ORAL | Status: DC
  Administered 2017-12-29 – 2018-01-02 (×5): 5 mg via ORAL
  Filled 2017-12-29 (×6): qty 1
  Filled 2017-12-29: qty 7
  Filled 2017-12-29: qty 1

## 2017-12-29 MED ORDER — NICOTINE 21 MG/24HR TD PT24
21.0000 mg | MEDICATED_PATCH | Freq: Every day | TRANSDERMAL | Status: DC
  Filled 2017-12-29 (×6): qty 1

## 2017-12-29 MED ORDER — ACETAMINOPHEN 325 MG PO TABS
650.0000 mg | ORAL_TABLET | Freq: Four times a day (QID) | ORAL | Status: DC | PRN
  Administered 2017-12-30 – 2017-12-31 (×2): 650 mg via ORAL
  Filled 2017-12-29 (×2): qty 2

## 2017-12-29 MED ORDER — NALTREXONE HCL 50 MG PO TABS
50.0000 mg | ORAL_TABLET | Freq: Every day | ORAL | Status: DC
  Administered 2017-12-29 – 2018-01-02 (×5): 50 mg via ORAL
  Filled 2017-12-29 (×3): qty 1
  Filled 2017-12-29: qty 7
  Filled 2017-12-29 (×4): qty 1

## 2017-12-29 MED ORDER — TRAZODONE HCL 150 MG PO TABS
150.0000 mg | ORAL_TABLET | Freq: Every day | ORAL | Status: DC
  Administered 2017-12-29 – 2018-01-01 (×4): 150 mg via ORAL
  Filled 2017-12-29 (×3): qty 1
  Filled 2017-12-29: qty 7
  Filled 2017-12-29 (×2): qty 1

## 2017-12-29 MED ORDER — METFORMIN HCL 500 MG PO TABS
1000.0000 mg | ORAL_TABLET | Freq: Two times a day (BID) | ORAL | Status: DC
  Administered 2017-12-29 – 2018-01-02 (×9): 1000 mg via ORAL
  Filled 2017-12-29: qty 28
  Filled 2017-12-29 (×8): qty 2
  Filled 2017-12-29: qty 28
  Filled 2017-12-29 (×4): qty 2

## 2017-12-29 MED ORDER — INSULIN ASPART 100 UNIT/ML ~~LOC~~ SOLN
0.0000 [IU] | Freq: Three times a day (TID) | SUBCUTANEOUS | Status: DC
  Administered 2017-12-29 (×2): 8 [IU] via SUBCUTANEOUS
  Administered 2017-12-29 – 2017-12-30 (×2): 5 [IU] via SUBCUTANEOUS
  Administered 2017-12-30: 11 [IU] via SUBCUTANEOUS
  Administered 2017-12-30: 8 [IU] via SUBCUTANEOUS
  Administered 2017-12-31: 3 [IU] via SUBCUTANEOUS
  Administered 2017-12-31: 5 [IU] via SUBCUTANEOUS
  Administered 2017-12-31: 1 [IU] via SUBCUTANEOUS
  Administered 2018-01-01: 15 [IU] via SUBCUTANEOUS
  Administered 2018-01-01 (×2): 5 [IU] via SUBCUTANEOUS
  Administered 2018-01-02: 3 [IU] via SUBCUTANEOUS

## 2017-12-29 MED ORDER — ALUM & MAG HYDROXIDE-SIMETH 200-200-20 MG/5ML PO SUSP: 30.0000 mL | ORAL | Status: DC | PRN

## 2017-12-29 MED ORDER — SERTRALINE HCL 100 MG PO TABS
100.0000 mg | ORAL_TABLET | Freq: Every day | ORAL | Status: DC
  Administered 2017-12-30 – 2018-01-02 (×4): 100 mg via ORAL
  Filled 2017-12-29: qty 1
  Filled 2017-12-29: qty 7
  Filled 2017-12-29 (×5): qty 1

## 2017-12-29 MED ORDER — ASPIRIN EC 325 MG PO TBEC
325.0000 mg | DELAYED_RELEASE_TABLET | Freq: Every day | ORAL | Status: DC
  Administered 2017-12-29 – 2018-01-02 (×5): 325 mg via ORAL
  Filled 2017-12-29 (×7): qty 1

## 2017-12-29 NOTE — [facility] Suicide Risk Assessment (Signed)
[facility] INPATIENT:  Family/Significant Other Suicide Prevention Education  Suicide Prevention Education:  Patient Refusal for Family/Significant Other Suicide Prevention Education: The patient Marcus BatheGregory L Martis Sr. has refused to provide written consent for family/significant other to be provided Family/Significant Other Suicide Prevention Education during admission and/or prior to discharge.  Physician notified.  SPE completed with pt, as pt refused to consent to family contact. SPI pamphlet provided to pt and pt was encouraged to share information with support network, ask questions, and talk about any concerns relating to SPE. Pt denies access to guns/firearms and verbalized understanding of information provided. Mobile Crisis information also provided to pt.   Rona RavensHeather S Margrit Minner LCSW 12/29/2017, 10:15 AM

## 2017-12-29 NOTE — Progress Notes (Signed)
Admission Note:  Vol 52 yo AA male, admitted after going to the ED c/o symptoms of a stroke.  Pt has a hx of stroke, but was medically cleared at this time.  He told ED staff that he was having suicidal thoughts with a plan to overdose on his medications.  Pt says he relapsed on cocaine and has also been drinking a lot.  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  He reports HTN and DM.  Pt was cooperative with the admission process although he was very drowsy and unsteady when [facility].  Pt answered the assessment questions.  He signed all paperwork and cooperated with the search process.  Safety checks q15 minutes were initiated.

## 2017-12-29 NOTE — Progress Notes (Signed)
Patient ID: Marcus BatheGregory L Hendryx Sr., male   DOB: 23-Jan-1966, 52 y.o.   MRN: [id number]  Nursing Progress Note [id number]  Data: Patient presents with flat affect and is guarded/minimal during interactions. Patient compliant with scheduled medications. Patient denies pain/physical complaints to Clinical research associatewriter. Patient completed self-inventory sheet and rates depression, hopelessness, and anxiety 9,9,9 respectively. Patient rates their sleep and appetite as fair/poor respectively. Patient is isolative to his room and not attending groups. Patient currently reports passive SI but denies HI/AVH.   Action: Patient is educated about and provided medication per provider's orders. Patient safety maintained with q15 min safety checks and frequent rounding. High fall risk precautions in place. Emotional support given. 1:1 interaction and active listening provided. Patient encouraged to attend meals, groups, and work on treatment plan and goals. Labs, vital signs and patient behavior monitored throughout shift.   Response: Patient remains safe on the unit at this time and agrees to come to staff with any issues/concerns. Will continue to support and monitor.

## 2017-12-29 NOTE — [facility] Group Notes (Signed)
[facility] Group Therapy 12/29/2017 1:15pm  Type of Therapy: Mental Health Association Presentation  Participation Level: Invited. Chose to remain in bed.   Dinna Severs S Giavonni Fonder, LCSW 12/29/2017 2:58 PM  

## 2017-12-29 NOTE — Tx Team (Signed)
Initial Treatment Plan 12/29/2017 5:51 AM Marcus BatheGregory L Tomasello Sr. BJY:[id number]    PATIENT STRESSORS: Health problems Medication change or noncompliance Substance abuse   PATIENT STRENGTHS: Average or above average intelligence Capable of independent living General fund of knowledge Motivation for treatment/growth Supportive family/friends   PATIENT IDENTIFIED PROBLEMS: Substance abuse  Depression  Risk for self harm      "Learn how to remain sober"  "I may need to have some meds changed"         DISCHARGE CRITERIA:  Ability to meet basic life and health needs Improved stabilization in mood, thinking, and/or behavior Motivation to continue treatment in a less acute level of care Reduction of life-threatening or endangering symptoms to within safe limits Verbal commitment to aftercare and medication compliance Withdrawal symptoms are absent or subacute and managed without 24-hour nursing intervention  PRELIMINARY DISCHARGE PLAN: Attend aftercare/continuing care group Outpatient therapy Return to previous living arrangement  PATIENT/FAMILY INVOLVEMENT: This treatment plan has been presented to and reviewed with the patient, Marcus BatheGregory L Moisan Sr., and/or family member.  The patient and family have been given the opportunity to ask questions and make suggestions.  Charlott HollerSpeagle, [place] Qin Church, RN 12/29/2017, 5:51 AM

## 2017-12-29 NOTE — ED Notes (Signed)
Voluntary consent form signed by patient and faxed to [facility]-Monique,RN  

## 2017-12-29 NOTE — Progress Notes (Signed)
Did not attend group 

## 2017-12-29 NOTE — Progress Notes (Signed)
Recreation Therapy Notes  Animal-Assisted Activity (AAA) Program Checklist/Progress Notes Patient Eligibility Criteria Checklist & Daily Group note for Rec Tx Intervention  Date: 12.3.19 Time: 1430 Location: 400 Hall Dayroom   AAA/T Program Assumption of Risk Form signed by Patient/ or Parent Legal Guardian  YES   Patient is free of allergies or sever asthma  YES   Patient reports no fear of animals  YES   Patient reports no history of cruelty to animals YES   Patient understands his/her participation is voluntary YES   Patient washes hands before animal contact  YES   Patient washes hands after animal contact  YES   Education: Hand Washing, Appropriate Animal Interaction   Education Outcome: Acknowledges understanding/In group clarification offered/Needs additional education.   Clinical Observations/Feedback: Pt did not attend activity.     Venice Liz, LRT/CTRS         Brnadon Eoff A 12/29/2017 3:50 PM 

## 2017-12-29 NOTE — H&P (Signed)
Psychiatric Admission Assessment Adult  Patient Identification: Marcus HEINLEN Sr. MRN:  [id number] Date of Evaluation:  12/29/2017 Chief Complaint:  MDD Principal Diagnosis: SI Diagnosis:  Active Problems:   Severe recurrent major depression without psychotic features (HCC)  History of Present Illness:   This is a repeat admission for Mr. Marcus Cummings he has been admitted here as recently as September, has had other admissions through the [facility] medical centers.  In September he presented with fears that he was having a cardiac event complaining of chest pain but then acknowledge suicidal thinking and acknowledges numerous past diagnoses to include bipolar disorder, PTSD, substance abuse involving alcohol cocaine and cannabis, and was admitted for stabilization.  Apparently he did have a CVA in 2017 and had bypass surgery in 2019 and on this admission he once again presented initially complaining of medical symptoms.  He presented through the emergency department stating he feared he was having a stroke but is MRI of the brain was normal.  He then confessed that he was having suicidal thoughts and would like to come off of drugs specifically cocaine alcohol and cannabis.  He does not recall ever having a seizure or hallucinations when coming off of drugs. He states he has a place to live when I ask for issues of secondary gain he states he is welcome back at his own house but he knows there are drugs in the neighborhood.  Though he reported suicidal thoughts he does not have a plan.  He is minimally engaged in the interview process.  Sluggish sleeping in bed   Associated Signs/Symptoms: Depression Symptoms:  depressed mood, (Hypo) Manic Symptoms:  none Anxiety Symptoms:  denied Psychotic Symptoms: Denies current psychotic symptoms but has been on [place] PTSD Symptoms: Denied specific symptoms reports history of PTSD Negative Total Time spent with patient: 45 minutes  Past  Psychiatric History: ext  Is the patient at risk to self? Yes.    Has the patient been a risk to self in the past 6 months? Yes.    Has the patient been a risk to self within the distant past? Yes.    Is the patient a risk to others? No.  Has the patient been a risk to others in the past 6 months? No.  Has the patient been a risk to others within the distant past? No.   Prior Inpatient Therapy:   Prior Outpatient Therapy:    Alcohol Screening: 1. How often do you have a drink containing alcohol?: 2 to 3 times a week 2. How many drinks containing alcohol do you have on a typical day when you are drinking?: 5 or 6 3. How often do you have six or more drinks on one occasion?: Monthly AUDIT-C Score: 7 4. How often during the last year have you found that you were not able to stop drinking once you had started?: Less than monthly 5. How often during the last year have you failed to do what was normally expected from you becasue of drinking?: Monthly 6. How often during the last year have you needed a first drink in the morning to get yourself going after a heavy drinking session?: Daily or almost daily 7. How often during the last year have you had a feeling of guilt of remorse after drinking?: Weekly 8. How often during the last year have you been unable to remember what happened the night before because you had been drinking?: Daily or almost daily 9. Have you or someone else  been injured as a result of your drinking?: No 10. Has a relative or friend or a doctor or another health worker been concerned about your drinking or suggested you cut down?: Yes, during the last year Alcohol Use Disorder Identification Test Final Score (AUDIT): 25 Intervention/Follow-up: Alcohol Education Substance Abuse History in the last 12 months:  Yes.   Consequences of Substance Abuse: Medical Consequences:  c/p Previous Psychotropic Medications: Yes  Psychological Evaluations: No  Past Medical History:  Past  Medical History:  Diagnosis Date  . Depression   . Diabetes mellitus without complication (HCC)   . Hypertension   . MI, acute, non ST segment elevation (HCC) 2007   stents x 2 2007 [facility] [place])  . Stroke [facility]) 2017    Past Surgical History:  Procedure Laterality Date  . CORONARY ARTERY BYPASS GRAFT N/A 06/23/2017   Procedure: CORONARY ARTERY BYPASS GRAFTING (CABG) x 3; Using Left Internal Mammary Artery and Right Leg Greater Saphenous Vein harvested endoscopically.;  Surgeon: Kerin Perna, MD;  Location: [facility] OR;  Service: Open Heart Surgery;  Laterality: N/A;  . LEFT HEART CATH AND CORONARY ANGIOGRAPHY N/A 06/18/2017   Procedure: LEFT HEART CATH AND CORONARY ANGIOGRAPHY;  Surgeon: Lennette Bihari, MD;  Location: [facility] INVASIVE CV LAB;  Service: Cardiovascular;  Laterality: N/A;  . TEE WITHOUT CARDIOVERSION N/A 06/23/2017   Procedure: TRANSESOPHAGEAL ECHOCARDIOGRAM (TEE);  Surgeon: Donata Clay, Theron Arista, MD;  Location: [facility] OR;  Service: Open Heart Surgery;  Laterality: N/A;   Family History:  Family History  Problem Relation Age of Onset  . CAD Mother   . Congestive Heart Failure Father    Family Psychiatric  History: denied Tobacco Screening: Have you used any form of tobacco in the last 30 days? (Cigarettes, Smokeless Tobacco, Cigars, and/or Pipes): Yes Tobacco use, Select all that apply: 5 or more cigarettes per day Are you interested in Tobacco Cessation Medications?: Yes, will notify MD for an order Counseled patient on smoking cessation including recognizing danger situations, developing coping skills and basic information about quitting provided: Refused/Declined practical counseling Social History:  Social History   Substance and Sexual Activity  Alcohol Use Yes   Comment: 1-2  beers a week     Social History   Substance and Sexual Activity  Drug Use Yes  . Types: Marijuana, Cocaine    Additional Social History:      Pain Medications: See home med list Prescriptions: See   home med list Over the Counter: See home med list History of alcohol / drug use?: Yes Negative Consequences of Use: Personal relationships, Financial Name of Substance 1: cocaine 1 - Age of First Use: unknown 1 - Amount (size/oz): couple of grams 1 - Frequency: a couple of times a week 1 - Duration: unknown 1 - Last Use / Amount: 2 days ago Name of Substance 2: alcohol 2 - Age of First Use: unknown 2 - Amount (size/oz): 1-2 40 oz beers 2 - Frequency: once a week 2 - Last Use / Amount: "last Wednesday"                Allergies:   Allergies  Allergen Reactions  . Zyprexa [Olanzapine] Other (See Comments)    unknown   Lab Results:  Results for orders placed or performed during the hospital encounter of 12/29/17 (from the past 48 hour(s))  Glucose, capillary     Status: Abnormal   Collection Time: 12/29/17  5:11 AM  Result Value Ref Range   Glucose-Capillary 281 (  H) 70 - 99 mg/dL   Comment 1 Notify RN    Comment 2 Document in Chart     Blood Alcohol level:  Lab Results  Component Value Date   ETH <10 12/28/2017   ETH <10 09/29/2017    Metabolic Disorder Labs:  Lab Results  Component Value Date   HGBA1C 10.0 (H) 10/01/2017   MPG 240.3 10/01/2017   MPG 165.68 06/17/2017   No results found for: PROLACTIN Lab Results  Component Value Date   CHOL 209 (H) 10/01/2017   TRIG 128 10/01/2017   HDL 32 (L) 10/01/2017   CHOLHDL 6.5 10/01/2017   VLDL 26 10/01/2017   LDLCALC 151 (H) 10/01/2017   LDLCALC 146 (H) 06/19/2017    Current Medications: Current Facility-Administered Medications  Medication Dose Route Frequency Provider Last Rate Last Dose  . acetaminophen (TYLENOL) tablet 650 mg  650 mg Oral Q6H PRN Jackelyn Poling, NP      . alum & mag hydroxide-simeth (MAALOX/MYLANTA) 200-200-20 MG/5ML suspension 30 mL  30 mL Oral Q4H PRN Jackelyn Poling, NP      . aspirin EC tablet 325 mg  325 mg Oral Daily Nira Conn A, NP      . furosemide (LASIX) tablet 40 mg  40 mg  Oral Daily Nira Conn A, NP      . insulin aspart (novoLOG) injection 0-15 Units  0-15 Units Subcutaneous TID WC Nira Conn A, NP      . insulin aspart (novoLOG) injection 0-5 Units  0-5 Units Subcutaneous QHS Nira Conn A, NP      . lisinopril (PRINIVIL,ZESTRIL) tablet 5 mg  5 mg Oral Daily Nira Conn A, NP      . magnesium hydroxide (MILK OF MAGNESIA) suspension 30 mL  30 mL Oral Daily PRN Jackelyn Poling, NP      . Melatonin TABS 6 mg  6 mg Oral QHS PRN Nira Conn A, NP      . metFORMIN (GLUCOPHAGE) tablet 1,000 mg  1,000 mg Oral BID WC Nira Conn A, NP      . metoprolol tartrate (LOPRESSOR) tablet 50 mg  50 mg Oral BID Nira Conn A, NP      . naltrexone (DEPADE) tablet 50 mg  50 mg Oral Daily Nira Conn A, NP      . nicotine (NICODERM CQ - dosed in mg/24 hours) patch 21 mg  21 mg Transdermal Daily Nira Conn A, NP      . potassium chloride SA (K-DUR,KLOR-CON) CR tablet 20 mEq  20 mEq Oral Daily Jackelyn Poling, NP      . Melene Muller ON 12/30/2017] sertraline (ZOLOFT) tablet 100 mg  100 mg Oral Daily Malvin Johns, MD      . traZODone (DESYREL) tablet 150 mg  150 mg Oral QHS Nira Conn A, NP       PTA Medications: Medications Prior to Admission  Medication Sig Dispense Refill Last Dose  . aspirin EC 325 MG EC tablet Take 1 tablet (325 mg total) by mouth daily. 30 tablet 0 Past Week at Unknown time  . atorvastatin (LIPITOR) 80 MG tablet Take 1 tablet (80 mg total) by mouth daily at 6 PM. 30 tablet 0 Past Week at Unknown time  . furosemide (LASIX) 40 MG tablet Take 1 tablet (40 mg total) by mouth daily. 30 tablet 0 Past Week at Unknown time  . insulin glargine (LANTUS) 100 UNIT/ML injection Inject 0.3 mLs (30 Units total) into the skin at bedtime.  10 mL 0 Past Week at Unknown time  . lisinopril (PRINIVIL,ZESTRIL) 5 MG tablet Take 1 tablet (5 mg total) by mouth daily. For high blood pressure 60 tablet 0 Past Week at Unknown time  . Melatonin 3 MG TABS Take 2 tablets (6 mg total) by  mouth at bedtime as needed (for sleep). 60 tablet 0 Past Week at Unknown time  . metFORMIN (GLUCOPHAGE) 1000 MG tablet Take 1 tablet (1,000 mg total) by mouth 2 (two) times daily with a meal. 60 tablet 0 Past Week at Unknown time  . metoprolol tartrate (LOPRESSOR) 50 MG tablet Take 1 tablet (50 mg total) by mouth 2 (two) times daily. 60 tablet 0 Past Week at Unknown time  . naltrexone (DEPADE) 50 MG tablet Take 1 tablet (50 mg total) by mouth daily. 30 tablet 0 Past Week at Unknown time  . paliperidone (INVEGA SUSTENNA) 234 MG/1.5ML SUSY injection Inject 234 mg into the muscle every 28 (twenty-eight) days. 1.8 mL 0 Past Month at Unknown time  . paliperidone (INVEGA) 3 MG 24 hr tablet Take 1 tablet (3 mg total) by mouth at bedtime for 21 days. Stop use the day before next [place] injection (Patient not taking: Reported on 12/28/2017) 7 tablet 0 Not Taking at Unknown time  . potassium chloride SA (K-DUR,KLOR-CON) 20 MEQ tablet Take 1 tablet (20 mEq total) by mouth daily. 30 tablet 0 Past Week at Unknown time  . sertraline (ZOLOFT) 25 MG tablet Take 3 tablets (75 mg total) by mouth daily. For mood ocntrol 90 tablet 0 Past Week at Unknown time  . traZODone (DESYREL) 150 MG tablet Take 1 tablet (150 mg total) by mouth at bedtime. For mood control 30 tablet 0 Past Week at Unknown time    Musculoskeletal: Strength & Muscle Tone: within normal limits Gait & Station: unsteady Patient leans: N/A  Psychiatric Specialty Exam: Physical Exam  ROS  Blood pressure 109/68, pulse 87, temperature 98.2 F (36.8 C), temperature source Oral, resp. rate 18, height 6' (1.829 m), weight 86.6 kg.Body mass index is 25.9 kg/m.  General Appearance: Casual  Eye Contact:  None  Speech:  Slow and Slurred  Volume:  Decreased  Mood:  Depressed  Affect:  Congruent and Flat  Thought Process:  Coherent  Orientation:  Full (Time, Place, and Person)  Thought Content:  Logical  Suicidal Thoughts:  Yes.  without  intent/plan  Homicidal Thoughts:  No  Memory:  Immediate;   Poor  Judgement:  Intact  Insight:  Fair  Psychomotor Activity:  Decreased  Concentration:  Concentration: Poor  Recall:  Poor  Fund of Knowledge:  Poor  Language:  Poor  Akathisia:  Negative  Handed:  Right  AIMS (if indicated):     Assets:  Communication Skills  ADL's:  Intact  Cognition:  WNL  Sleep:       Treatment Plan Summary: Daily contact with patient to assess and evaluate symptoms and progress in treatment and Medication management  Observation Level/Precautions:  15 minute checks  Laboratory:  UDS  Psychotherapy: Cognitive-based  Medications: Escalate Zoloft, continue routine meds  Consultations: Not needed  Discharge Concerns: Sobriety  Estimated LOS: 5 days  Other: See team notes   Physician Treatment Plan for Primary Diagnosis: <principal problem not specified> Long Term Goal(s): Improvement in symptoms so as ready for discharge  Short Term Goals: Ability to identify changes in lifestyle to reduce recurrence of condition will improve, Ability to verbalize feelings will improve, Ability to disclose and discuss  suicidal ideas and Ability to demonstrate self-control will improve  Physician Treatment Plan for Secondary Diagnosis: Active Problems:   Severe recurrent major depression without psychotic features (HCC)  Long Term Goal(s): Improvement in symptoms so as ready for discharge  Short Term Goals: Ability to identify changes in lifestyle to reduce recurrence of condition will improve and Ability to verbalize feelings will improve  I certify that inpatient services furnished can reasonably be expected to improve the patient's condition.    Malvin JohnsFARAH,Verneda Hollopeter, MD 12/3/20198:17 AM

## 2017-12-29 NOTE — Progress Notes (Signed)
Inpatient Diabetes Program Recommendations  AACE/ADA: New Consensus Statement on Inpatient Glycemic Control (2015)  Target Ranges:  Prepandial:   less than 140 mg/dL      Peak postprandial:   less than 180 mg/dL (1-2 hours)      Critically ill patients:  140 - 180 mg/dL   Results for Marcus Cummings, Marcus L SR. (MRN [id number]) as of 12/29/2017 12:12  Ref. Range 12/28/2017 05:02 12/28/2017 17:17 12/28/2017 18:44 12/28/2017 22:04  Glucose-Capillary Latest Ref Range: 70 - 99 mg/dL 409285 (H) 811390 (H)  15 units NOVOLOG  313 (H) 222 (H)  2 units NOVOLOG    Results for Marcus Cummings, Marcus L SR. (MRN [id number]) as of 12/29/2017 12:12  Ref. Range 12/29/2017 05:11 12/29/2017 08:25 12/29/2017 12:03  Glucose-Capillary Latest Ref Range: 70 - 99 mg/dL 213281 (H) 086275 (H)  8 units NOVOLOG  202 (H)  5 units NOVOLOG      Admit with: Severe recurrent major depression without psychotic features   History: DM  Home DM Meds: Lantus 30 units QHS       Metformin 1000 mg BID  Current Orders: Novolog Moderate Correction Scale/ SSI (0-15 units) TID AC + HS      Metformin 1000 mg BID     MD- Please consider starting Lantus 20 units QHS (70% total home dose)     --Will follow patient during hospitalization--  Ambrose FinlandJeannine Johnston Chuong Casebeer RN, MSN, CDE Diabetes Coordinator Inpatient Glycemic Control Team Team Pager: [phone] (8a-5p)
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
## 2017-12-30 LAB — GLUCOSE, CAPILLARY
Glucose-Capillary: 211 mg/dL — ABNORMAL HIGH (ref 70–99)
Glucose-Capillary: 264 mg/dL — ABNORMAL HIGH (ref 70–99)
Glucose-Capillary: 305 mg/dL — ABNORMAL HIGH (ref 70–99)
Glucose-Capillary: 308 mg/dL — ABNORMAL HIGH (ref 70–99)
Glucose-Capillary: 186 mg/dL — ABNORMAL HIGH (ref 70–99)

## 2017-12-30 NOTE — Progress Notes (Signed)
[facility] MD Progress Note  12/30/2017 9:04 AM [name]  [place]:  [id number] Subjective:    Patient is seen in his room, he is overall stable and mood he reports some depression but believes that his mood is improving.  He reports no cravings tremors or withdrawal symptoms.  No involuntary movements are noted.  Early engaged in cognitive therapy but otherwise stays to himself denies wanting to harm self or others. Notes indicate he is complaining of anxiety notes indicate he denies hallucinations and can verbally contract, he endorsed suicidal thinking last evening at around midnight but he tells me he does not have it now and he can contract  Described also has isolative did not attend groups encouraged to attend groups.  Principal Problem: <principal problem not specified> Diagnosis: Active Problems:   Severe recurrent major depression without psychotic features (HCC)  Total Time spent with patient: 20 minutes  Past Medical History:  Past Medical History:  Diagnosis Date  . Depression   . Diabetes mellitus without complication (HCC)   . Hypertension   . MI, acute, non ST segment elevation (HCC) 2007   stents x 2 2007 [facility])  . Stroke [facility]) 2017    Past Surgical History:  Procedure Laterality Date  . CORONARY ARTERY BYPASS GRAFT N/A 06/23/2017   Procedure: CORONARY ARTERY BYPASS GRAFTING (CABG) x 3; Using Left Internal Mammary Artery and Right Leg Greater Saphenous Vein harvested endoscopically.;  Surgeon: Kerin Perna, MD;  Location: [facility] OR;  Service: Open Heart Surgery;  Laterality: N/A;  . LEFT HEART CATH AND CORONARY ANGIOGRAPHY N/A 06/18/2017   Procedure: LEFT HEART CATH AND CORONARY ANGIOGRAPHY;  Surgeon: Lennette Bihari, MD;  Location: [facility] INVASIVE CV LAB;  Service: Cardiovascular;  Laterality: N/A;  . TEE WITHOUT CARDIOVERSION N/A 06/23/2017   Procedure: TRANSESOPHAGEAL ECHOCARDIOGRAM (TEE);  Surgeon: Donata Clay, Theron Arista, MD;  Location: [facility] OR;  Service: Open Heart  Surgery;  Laterality: N/A;   Family History:  Family History  Problem Relation Age of Onset  . CAD Mother   . Congestive Heart Failure Father     Social History:  Social History   Substance and Sexual Activity  Alcohol Use Yes   Comment: 1-2  beers a week     Social History   Substance and Sexual Activity  Drug Use Yes  . Types: Marijuana, Cocaine    Social History   Socioeconomic History  . Marital status: Single    Spouse name: Not on file  . Number of children: Not on file  . Years of education: Not on file  . Highest education level: Not on file  Occupational History  . Not on file  Social Needs  . Financial resource strain: Not on file  . Food insecurity:    Worry: Not on file    Inability: Not on file  . Transportation needs:    Medical: Not on file    Non-medical: Not on file  Tobacco Use  . Smoking status: Current Every Day Smoker    Packs/day: 1.00    Types: Cigarettes  . Smokeless tobacco: Never Used  . Tobacco comment: Declined information  Substance and Sexual Activity  . Alcohol use: Yes    Comment: 1-2  beers a week  . Drug use: Yes    Types: Marijuana, Cocaine  . Sexual activity: Not Currently  Lifestyle  . Physical activity:    Days per week: Not on file    Minutes per session: Not on file  .  Stress: Not on file  Relationships  . Social connections:    Talks on phone: Not on file    Gets together: Not on file    Attends religious service: Not on file    Active member of club or organization: Not on file    Attends meetings of clubs or organizations: Not on file    Relationship status: Not on file  Other Topics Concern  . Not on file  Social History Narrative  . Not on file   Additional Social History:    Pain Medications: See home med list Prescriptions: See  home med list Over the Counter: See home med list History of alcohol / drug use?: Yes Negative Consequences of Use: Personal relationships, Financial Name of Substance  1: cocaine 1 - Age of First Use: unknown 1 - Amount (size/oz): couple of grams 1 - Frequency: a couple of times a week 1 - Duration: unknown 1 - Last Use / Amount: 2 days ago Name of Substance 2: alcohol 2 - Age of First Use: unknown 2 - Amount (size/oz): 1-2 40 oz beers 2 - Frequency: once a week 2 - Last Use / Amount: "last Wednesday"                Sleep: Good  Appetite:  Good  Current Medications: Current Facility-Administered Medications  Medication Dose Route Frequency Provider Last Rate Last Dose  . acetaminophen (TYLENOL) tablet 650 mg  650 mg Oral Q6H PRN Nira Conn A, NP      . alum & mag hydroxide-simeth (MAALOX/MYLANTA) 200-200-20 MG/5ML suspension 30 mL  30 mL Oral Q4H PRN Nira Conn A, NP      . aspirin EC tablet 325 mg  325 mg Oral Daily Nira Conn A, NP   325 mg at 12/30/17 0826  . furosemide (LASIX) tablet 40 mg  40 mg Oral Daily Nira Conn A, NP   40 mg at 12/30/17 0823  . insulin aspart (novoLOG) injection 0-15 Units  0-15 Units Subcutaneous TID WC Nira Conn A, NP   5 Units at 12/30/17 [phone]  . insulin aspart (novoLOG) injection 0-5 Units  0-5 Units Subcutaneous QHS Nira Conn A, NP   4 Units at 12/29/17 2131  . lisinopril (PRINIVIL,ZESTRIL) tablet 5 mg  5 mg Oral Daily Nira Conn A, NP   5 mg at 12/30/17 3086  . magnesium hydroxide (MILK OF MAGNESIA) suspension 30 mL  30 mL Oral Daily PRN Jackelyn Poling, NP      . Melatonin TABS 6 mg  6 mg Oral QHS PRN Nira Conn A, NP      . metFORMIN (GLUCOPHAGE) tablet 1,000 mg  1,000 mg Oral BID WC Nira Conn A, NP   1,000 mg at 12/30/17 0823  . metoprolol tartrate (LOPRESSOR) tablet 50 mg  50 mg Oral BID Nira Conn A, NP   50 mg at 12/30/17 5784  . naltrexone (DEPADE) tablet 50 mg  50 mg Oral Daily Nira Conn A, NP   50 mg at 12/29/17 0825  . nicotine (NICODERM CQ - dosed in mg/24 hours) patch 21 mg  21 mg Transdermal Daily Nira Conn A, NP      . potassium chloride SA (K-DUR,KLOR-CON) CR tablet  20 mEq  20 mEq Oral Daily Nira Conn A, NP   20 mEq at 12/30/17 6962  . sertraline (ZOLOFT) tablet 100 mg  100 mg Oral Daily Malvin Johns, MD   100 mg at 12/30/17 9528  . traZODone (DESYREL) tablet  150 mg  150 mg Oral QHS Nira ConnBerry, Jason A, NP   150 mg at 12/29/17 2132    Lab Results:  Results for orders placed or performed during the hospital encounter of 12/29/17 (from the past 48 hour(s))  Glucose, capillary     Status: Abnormal   Collection Time: 12/29/17  5:11 AM  Result Value Ref Range   Glucose-Capillary 281 (H) 70 - 99 mg/dL   Comment 1 Notify RN    Comment 2 Document in Chart   Glucose, capillary     Status: Abnormal   Collection Time: 12/29/17  8:25 AM  Result Value Ref Range   Glucose-Capillary 275 (H) 70 - 99 mg/dL  Glucose, capillary     Status: Abnormal   Collection Time: 12/29/17 12:03 PM  Result Value Ref Range   Glucose-Capillary 202 (H) 70 - 99 mg/dL  Glucose, capillary     Status: Abnormal   Collection Time: 12/29/17  5:08 PM  Result Value Ref Range   Glucose-Capillary 273 (H) 70 - 99 mg/dL  Glucose, capillary     Status: Abnormal   Collection Time: 12/29/17  8:51 PM  Result Value Ref Range   Glucose-Capillary 319 (H) 70 - 99 mg/dL  Glucose, capillary     Status: Abnormal   Collection Time: 12/30/17  6:12 AM  Result Value Ref Range   Glucose-Capillary 211 (H) 70 - 99 mg/dL   Comment 1 Notify RN     Blood Alcohol level:  Lab Results  Component Value Date   ETH <10 12/28/2017   ETH <10 09/29/2017    Metabolic Disorder Labs: Lab Results  Component Value Date   HGBA1C 10.0 (H) 10/01/2017   MPG 240.3 10/01/2017   MPG 165.68 06/17/2017   No results found for: PROLACTIN Lab Results  Component Value Date   CHOL 209 (H) 10/01/2017   TRIG 128 10/01/2017   HDL 32 (L) 10/01/2017   CHOLHDL 6.5 10/01/2017   VLDL 26 10/01/2017   LDLCALC 151 (H) 10/01/2017   LDLCALC 146 (H) 06/19/2017    Physical Findings: AIMS: Facial and Oral Movements Muscles of  Facial Expression: None, normal Lips and Perioral Area: None, normal Jaw: None, normal Tongue: None, normal,Extremity Movements Upper (arms, wrists, hands, fingers): None, normal Lower (legs, knees, ankles, toes): None, normal, Trunk Movements Neck, shoulders, hips: None, normal, Overall Severity Severity of abnormal movements (highest score from questions above): None, normal Incapacitation due to abnormal movements: None, normal Patient's awareness of abnormal movements (rate only patient's report): No Awareness, Dental Status Current problems with teeth and/or dentures?: No Does patient usually wear dentures?: No  CIWA:  CIWA-Ar Total: 3 COWS:     Musculoskeletal: Strength & Muscle Tone: within normal limits Gait & Station: normal Patient leans: N/A  Psychiatric Specialty Exam: Physical Exam  ROS  Blood pressure 110/73, pulse 95, temperature 98.7 F (37.1 C), temperature source Oral, resp. rate 18, height 6' (1.829 m), weight 86.6 kg.Body mass index is 25.9 kg/m.  General Appearance: Casual  Eye Contact:  Fair  Speech:  Clear and Coherent  Volume:  Normal  Mood:  Depressed  Affect:  Appropriate  Thought Process:  Goal Directed  Orientation:  Full (Time, Place, and Person)  Thought Content:  Logical  Suicidal Thoughts:  No  Homicidal Thoughts:  No  Memory:  Immediate;   Good  Judgement:  Good  Insight:  Fair  Psychomotor Activity:  Normal  Concentration:  Concentration: Good  Recall:  Good  Fund of Knowledge:  Good  Language:  Good  Akathisia:  Negative  Handed:  Right  AIMS (if indicated):     Assets:  Communication Skills  ADL's:  Intact  Cognition:  WNL  Sleep:  Number of Hours: 6.75    Desires no current medication changes can contract for safety he has reported some intermittent suicidal thoughts other examiners denies on this exam monitor least 1 more day discussed with team.  No other changes in medications.  Continue cognitive based therapy  Treatment  Plan Summary: Daily contact with patient to assess and evaluate symptoms and progress in treatment  Pao Haffey, MD 12/30/2017, 9:04 AM

## 2017-12-30 NOTE — Progress Notes (Signed)
Results for Warren DanesDUDLEY, Arshia L SR. (MRN [id number]) as of 12/30/2017 13:21  Ref. Range 12/29/2017 12:03 12/29/2017 17:08 12/29/2017 20:51 12/30/2017 06:12 12/30/2017 12:12  Glucose-Capillary Latest Ref Range: 70 - 99 mg/dL 409202 (H) 811273 (H) 914319 (H) 211 (H) 264 (H)  CBGs remain elevated greater than 180 mg/dl. Recommend starting Lantus 20 units daily if blood sugars continue to be elevated. Continue Novolog correction scale as ordered.   Smith MinceKendra Lula Kolton RN BSN CDE Diabetes Coordinator Pager: [phone]  8am-5pm

## 2017-12-30 NOTE — Progress Notes (Signed)
Recreation Therapy Notes  Date: 12.4.19 Time: 0930 Location: 300 Hall Dayroom  Group Topic: Stress Management  Goal Area(s) Addresses:  Patient will verbalize importance of using healthy stress management.  Patient will identify positive emotions associated with healthy stress management.   Intervention: Stress Management  Activity :  Guided Imagery.  LRT introduced the stress management technique of guided imagery.  LRT read a script that allowed patients to envision their peaceful place.  Patients were to follow along as script was read to engage in activity.  Education:  Stress Management, Discharge Planning.   Education Outcome: Acknowledges edcuation/In group clarification offered/Needs additional education  Clinical Observations/Feedback: Pt did not attend group.     Caroll RancherMarjette Equilla Que, LRT/CTRS         Lillia AbedLindsay, Teshaun Olarte A 12/30/2017 11:21 AM

## 2017-12-30 NOTE — Tx Team (Signed)
Interdisciplinary Treatment and Diagnostic Plan Update  12/30/2017 Time of Session: [id number] Marcus FOHL Sr. MRN: [id number]  Principal Diagnosis: MDD, recurrent, severe, without psychosis  Secondary Diagnoses: Active Problems:   Severe recurrent major depression without psychotic features (HCC)   Current Medications:  Current Facility-Administered Medications  Medication Dose Route Frequency Provider Last Rate Last Dose  . acetaminophen (TYLENOL) tablet 650 mg  650 mg Oral Q6H PRN Nira Conn A, NP      . alum & mag hydroxide-simeth (MAALOX/MYLANTA) 200-200-20 MG/5ML suspension 30 mL  30 mL Oral Q4H PRN Nira Conn A, NP      . aspirin EC tablet 325 mg  325 mg Oral Daily Nira Conn A, NP   325 mg at 12/30/17 0826  . furosemide (LASIX) tablet 40 mg  40 mg Oral Daily Nira Conn A, NP   40 mg at 12/30/17 0823  . insulin aspart (novoLOG) injection 0-15 Units  0-15 Units Subcutaneous TID WC Nira Conn A, NP   5 Units at 12/30/17 [phone]  . insulin aspart (novoLOG) injection 0-5 Units  0-5 Units Subcutaneous QHS Nira Conn A, NP   4 Units at 12/29/17 2131  . lisinopril (PRINIVIL,ZESTRIL) tablet 5 mg  5 mg Oral Daily Nira Conn A, NP   5 mg at 12/30/17 6440  . magnesium hydroxide (MILK OF MAGNESIA) suspension 30 mL  30 mL Oral Daily PRN Jackelyn Poling, NP      . Melatonin TABS 6 mg  6 mg Oral QHS PRN Nira Conn A, NP      . metFORMIN (GLUCOPHAGE) tablet 1,000 mg  1,000 mg Oral BID WC Nira Conn A, NP   1,000 mg at 12/30/17 0823  . metoprolol tartrate (LOPRESSOR) tablet 50 mg  50 mg Oral BID Nira Conn A, NP   50 mg at 12/30/17 3474  . naltrexone (DEPADE) tablet 50 mg  50 mg Oral Daily Nira Conn A, NP   50 mg at 12/29/17 0825  . nicotine (NICODERM CQ - dosed in mg/24 hours) patch 21 mg  21 mg Transdermal Daily Nira Conn A, NP      . potassium chloride SA (K-DUR,KLOR-CON) CR tablet 20 mEq  20 mEq Oral Daily Nira Conn A, NP   20 mEq at 12/30/17 2595  . sertraline  (ZOLOFT) tablet 100 mg  100 mg Oral Daily Malvin Johns, MD   100 mg at 12/30/17 6387  . traZODone (DESYREL) tablet 150 mg  150 mg Oral QHS Nira Conn A, NP   150 mg at 12/29/17 2132   PTA Medications: Medications Prior to Admission  Medication Sig Dispense Refill Last Dose  . aspirin EC 325 MG EC tablet Take 1 tablet (325 mg total) by mouth daily. 30 tablet 0 Past Week at Unknown time  . atorvastatin (LIPITOR) 80 MG tablet Take 1 tablet (80 mg total) by mouth daily at 6 PM. 30 tablet 0 Past Week at Unknown time  . furosemide (LASIX) 40 MG tablet Take 1 tablet (40 mg total) by mouth daily. 30 tablet 0 Past Week at Unknown time  . insulin glargine (LANTUS) 100 UNIT/ML injection Inject 0.3 mLs (30 Units total) into the skin at bedtime. 10 mL 0 Past Week at Unknown time  . lisinopril (PRINIVIL,ZESTRIL) 5 MG tablet Take 1 tablet (5 mg total) by mouth daily. For high blood pressure 60 tablet 0 Past Week at Unknown time  . Melatonin 3 MG TABS Take 2 tablets (6 mg total) by mouth at bedtime as  needed (for sleep). 60 tablet 0 Past Week at Unknown time  . metFORMIN (GLUCOPHAGE) 1000 MG tablet Take 1 tablet (1,000 mg total) by mouth 2 (two) times daily with a meal. 60 tablet 0 Past Week at Unknown time  . metoprolol tartrate (LOPRESSOR) 50 MG tablet Take 1 tablet (50 mg total) by mouth 2 (two) times daily. 60 tablet 0 Past Week at Unknown time  . naltrexone (DEPADE) 50 MG tablet Take 1 tablet (50 mg total) by mouth daily. 30 tablet 0 Past Week at Unknown time  . paliperidone (INVEGA SUSTENNA) 234 MG/1.5ML SUSY injection Inject 234 mg into the muscle every 28 (twenty-eight) days. 1.8 mL 0 Past Month at Unknown time  . paliperidone (INVEGA) 3 MG 24 hr tablet Take 1 tablet (3 mg total) by mouth at bedtime for 21 days. Stop use the day before next [place]Invega Sustenna injection (Patient not taking: Reported on 12/28/2017) 7 tablet 0 Not Taking at Unknown time  . potassium chloride SA (K-DUR,KLOR-CON) 20 MEQ tablet  Take 1 tablet (20 mEq total) by mouth daily. 30 tablet 0 Past Week at Unknown time  . sertraline (ZOLOFT) 25 MG tablet Take 3 tablets (75 mg total) by mouth daily. For mood ocntrol 90 tablet 0 Past Week at Unknown time  . traZODone (DESYREL) 150 MG tablet Take 1 tablet (150 mg total) by mouth at bedtime. For mood control 30 tablet 0 Past Week at Unknown time    Patient Stressors: Health problems Medication change or noncompliance Substance abuse  Patient Strengths: Average or above average intelligence Capable of independent living General fund of knowledge Motivation for treatment/growth Supportive family/friends  Treatment Modalities: Medication Management, Group therapy, Case management,  1 to 1 session with clinician, Psychoeducation, Recreational therapy.   Physician Treatment Plan for Primary Diagnosis: MDD, recurrent, severe, without psychosis Long Term Goal(s): Improvement in symptoms so as ready for discharge Improvement in symptoms so as ready for discharge   Short Term Goals: Ability to identify changes in lifestyle to reduce recurrence of condition will improve Ability to verbalize feelings will improve Ability to disclose and discuss suicidal ideas Ability to demonstrate self-control will improve Ability to identify changes in lifestyle to reduce recurrence of condition will improve Ability to verbalize feelings will improve  Medication Management: Evaluate patient's response, side effects, and tolerance of medication regimen.  Therapeutic Interventions: 1 to 1 sessions, Unit Group sessions and Medication administration.  Evaluation of Outcomes: Progressing  Physician Treatment Plan for Secondary Diagnosis: Active Problems:   Severe recurrent major depression without psychotic features (HCC)  Long Term Goal(s): Improvement in symptoms so as ready for discharge Improvement in symptoms so as ready for discharge   Short Term Goals: Ability to identify changes in  lifestyle to reduce recurrence of condition will improve Ability to verbalize feelings will improve Ability to disclose and discuss suicidal ideas Ability to demonstrate self-control will improve Ability to identify changes in lifestyle to reduce recurrence of condition will improve Ability to verbalize feelings will improve     Medication Management: Evaluate patient's response, side effects, and tolerance of medication regimen.  Therapeutic Interventions: 1 to 1 sessions, Unit Group sessions and Medication administration.  Evaluation of Outcomes: Progressing   RN Treatment Plan for Primary Diagnosis: MDD, recurrent, severe, without psychosis Long Term Goal(s): Knowledge of disease and therapeutic regimen to maintain health will improve  Short Term Goals: Ability to remain free from injury will improve, Ability to verbalize feelings will improve and Ability to identify and develop  effective coping behaviors will improve  Medication Management: RN will administer medications as ordered by provider, will assess and evaluate patient's response and provide education to patient for prescribed medication. RN will report any adverse and/or side effects to prescribing provider.  Therapeutic Interventions: 1 on 1 counseling sessions, Psychoeducation, Medication administration, Evaluate responses to treatment, Monitor vital signs and CBGs as ordered, Perform/monitor CIWA, COWS, AIMS and Fall Risk screenings as ordered, Perform wound care treatments as ordered.  Evaluation of Outcomes: Progressing   LCSW Treatment Plan for Primary Diagnosis: MDD, recurrent, severe, without psychosis Long Term Goal(s): Safe transition to appropriate next level of care at discharge, Engage patient in therapeutic group addressing interpersonal concerns.  Short Term Goals: Engage patient in aftercare planning with referrals and resources, Facilitate patient progression through stages of change regarding substance use  diagnoses and concerns and Identify triggers associated with mental health/substance abuse issues  Therapeutic Interventions: Assess for all discharge needs, 1 to 1 time with Social worker, Explore available resources and support systems, Assess for adequacy in community support network, Educate family and significant other(s) on suicide prevention, Complete Psychosocial Assessment, Interpersonal group therapy.  Evaluation of Outcomes: Progressing   Progress in Treatment: Attending groups: Yes. Participating in groups: Yes.  Intermittently Taking medication as prescribed: Yes. Toleration medication: Yes. Family/Significant other contact made: SPE completed with pt; pt declined to consent to collateral contact.  Patient understands diagnosis: Yes. Discussing patient identified problems/goals with staff: Yes. Medical problems stabilized or resolved: Yes. Denies suicidal/homicidal ideation: Yes. Issues/concerns per patient self-inventory: No. Other: n/a   New problem(s) identified: No, Describe:  n/a  New Short Term/Long Term Goal(s): detox, medication management for mood stabilization; elimination of SI thoughts; development of comprehensive mental wellness/sobriety plan.   Patient Goals:  "Learn ways to stay sober and get my medications adjusted."   Discharge Plan or Barriers: Pt plans to return home to his apt at discharge and has follow-up appt at [facility]. [facility]AG pamphlet, Mobile Crisis information, and AA/NA information provided to patient for additional community support and resources.   Reason for Continuation of Hospitalization: Anxiety Depression Medication stabilization  Estimated Length of Stay: Thursday, 12/30/17  Attendees: Patient: 12/30/2017 8:41 AM  Physician: Dr. Jeannine Kitten MD 12/30/2017 8:41 AM  Nursing: Ethelene Browns RN 12/30/2017 8:41 AM  RN Care Manager:x 12/30/2017 8:41 AM  Social Worker: Corrie Mckusick LCSW 12/30/2017 8:41 AM  Recreational  Therapist: x 12/30/2017 8:41 AM  Other: Armandina Stammer NP 12/30/2017 8:41 AM  Other:  12/30/2017 8:41 AM  Other: 12/30/2017 8:41 AM    Scribe for Treatment Team: Rona Ravens, LCSW 12/30/2017 8:41 AM

## 2017-12-30 NOTE — Plan of Care (Signed)
D: Patient is in bed on approach. Patient came to medication window when prompted for evening medications then went back to bed. Patient is cooperative. Patient endorses Si. Patient denies HI, AVH, and verbally contracts for safety. Patient reports feeling depressed. Patient reports pain rated 8/10 in legs. Patient refused PRN pain medication.    A: Scheduled medications administered per MD order. Support provided. Patient educated on safety on the unit and medications. Routine safety checks every 15 minutes. Patient stated understanding to tell nurse about any new physical symptoms. Patient understands to tell staff of any needs.     R: No adverse drug reactions noted. Patient verbally contracts for safety. Patient remains safe at this time and will continue to monitor.   Problem: Safety: Goal: Periods of time without injury will increase Outcome: Progressing   Patient remains safe and will continue to monitor.

## 2017-12-30 NOTE — Plan of Care (Signed)
  Problem: Activity: Goal: Interest or engagement in activities will improve Outcome: Not Progressing   Problem: Coping: Goal: Ability to verbalize frustrations and anger appropriately will improve Outcome: Progressing   D: Pt alert and oriented on the unit. Pt engaging with RN staff and other pts. Pt denies HI and A/VH, but passively endorses SI. Pt's affect was flat and mood depressed. Pt is pleasant and cooperative. A: Education, support and encouragement provided, q15 minute safety checks remain in effect. Medications administered per MD orders. R: No reactions/side effects to medicine noted. Pt denies any concerns at this time, and verbally contracts for safety. Pt ambulating on the unit with no issues. Pt remains safe on and off the unit.

## 2017-12-30 NOTE — [facility] Group Notes (Signed)
LCSW Group Therapy Note   12/30/2017 1:15pm   Type of Therapy and Topic:  Group Therapy:  Overcoming Obstacles   Participation Level:  Did Not Attend--pt invited. Chose to remain in bed.    Description of Group:    In this group patients will be encouraged to explore what they see as obstacles to their own wellness and recovery. They will be guided to discuss their thoughts, feelings, and behaviors related to these obstacles. The group will process together ways to cope with barriers, with attention given to specific choices patients can make. Each patient will be challenged to identify changes they are motivated to make in order to overcome their obstacles. This group will be process-oriented, with patients participating in exploration of their own experiences as well as giving and receiving support and challenge from other group members.   Therapeutic Goals: 1. Patient will identify personal and current obstacles as they relate to admission. 2. Patient will identify barriers that currently interfere with their wellness or overcoming obstacles.  3. Patient will identify feelings, thought process and behaviors related to these barriers. 4. Patient will identify two changes they are willing to make to overcome these obstacles:      Summary of Patient Progress      Therapeutic Modalities:   Cognitive Behavioral Therapy Solution Focused Therapy Motivational Interviewing Relapse Prevention Therapy  Rona RavensHeather S Florrie Ramires, LCSW 12/30/2017 11:41 AM

## 2017-12-31 LAB — GLUCOSE, CAPILLARY
Glucose-Capillary: 178 mg/dL — ABNORMAL HIGH (ref 70–99)
Glucose-Capillary: 289 mg/dL — ABNORMAL HIGH (ref 70–99)
Glucose-Capillary: 241 mg/dL — ABNORMAL HIGH (ref 70–99)
Glucose-Capillary: 218 mg/dL — ABNORMAL HIGH (ref 70–99)

## 2017-12-31 NOTE — [facility] Group Notes (Signed)
Adult Nursing/MHT Psychoeducational Group Note  Date:  12/31/2017  Time: 4:00 PM  Group Topic/Focus: Personal Choices and Values The focus of this group is to help patients assess and explore the importance of values in their lives, how their values affect their decisions, how they express their values and what opposes their expression. Lead By: Alyssa J. RN, and Michael L, MHT  Participation Level:  Did Not Attend  Additional Comments:  The patients were provided a beach ball with various questions. Patients took turn tossing the ball and answering questions while engaging in socialization and discussion.   Patient was invited but declined to attend group.  Marcus Cummings A Marcus Cummings 12/31/2017 5:00 PM 

## 2017-12-31 NOTE — Progress Notes (Signed)
[facility] MD Progress Note  12/31/2017 8:24 AM Marcus Bathe Sr.  MRN:  [id number] Subjective:   Patient remains in bed through most of the morning thus far, he states he does not have cravings for drugs he states he does not want to harm himself he denies current auditory visual hallucinations.  He does however state he would like to stay 1 more day in order to make sure he is ready for discharge.  He denies wanting to harm himself or others. Acute psychosis.  No EPS or TD.  Principal Problem: Depression recurrent severe/ Diagnosis: Active Problems:   Severe recurrent major depression without psychotic features (HCC)   Total Time spent with patient: 20 minutes Past Medical History:  Past Medical History:  Diagnosis Date  . Depression   . Diabetes mellitus without complication (HCC)   . Hypertension   . MI, acute, non ST segment elevation (HCC) 2007   stents x 2 2007 [facility] [place])  . Stroke Marcus Cummings) 2017    Past Surgical History:  Procedure Laterality Date  . CORONARY ARTERY BYPASS GRAFT N/A 06/23/2017   Procedure: CORONARY ARTERY BYPASS GRAFTING (CABG) x 3; Using Left Internal Mammary Artery and Right Leg Greater Saphenous Vein harvested endoscopically.;  Surgeon: Kerin Perna, MD;  Location: [facility] OR;  Service: Open Heart Surgery;  Laterality: N/A;  . LEFT HEART CATH AND CORONARY ANGIOGRAPHY N/A 06/18/2017   Procedure: LEFT HEART CATH AND CORONARY ANGIOGRAPHY;  Surgeon: Lennette Bihari, MD;  Location: [facility] INVASIVE CV LAB;  Service: Cardiovascular;  Laterality: N/A;  . TEE WITHOUT CARDIOVERSION N/A 06/23/2017   Procedure: TRANSESOPHAGEAL ECHOCARDIOGRAM (TEE);  Surgeon: Donata Clay, Theron Arista, MD;  Location: [facility] OR;  Service: Open Heart Surgery;  Laterality: N/A;   Family History:  Family History  Problem Relation Age of Onset  . CAD Mother   . Congestive Heart Failure Father    Social History:  Social History   Substance and Sexual Activity  Alcohol Use Yes   Comment: 1-2  beers a week      Social History   Substance and Sexual Activity  Drug Use Yes  . Types: Marijuana, Cocaine    Social History   Socioeconomic History  . Marital status: Single    Spouse name: Not on file  . Number of children: Not on file  . Years of education: Not on file  . Highest education level: Not on file  Occupational History  . Not on file  Social Needs  . Financial resource strain: Not on file  . Food insecurity:    Worry: Not on file    Inability: Not on file  . Transportation needs:    Medical: Not on file    Non-medical: Not on file  Tobacco Use  . Smoking status: Current Every Day Smoker    Packs/day: 1.00    Types: Cigarettes  . Smokeless tobacco: Never Used  . Tobacco comment: Declined information  Substance and Sexual Activity  . Alcohol use: Yes    Comment: 1-2  beers a week  . Drug use: Yes    Types: Marijuana, Cocaine  . Sexual activity: Not Currently  Lifestyle  . Physical activity:    Days per week: Not on file    Minutes per session: Not on file  . Stress: Not on file  Relationships  . Social connections:    Talks on phone: Not on file    Gets together: Not on file    Attends religious service: Not on  file    Active member of club or organization: Not on file    Attends meetings of clubs or organizations: Not on file    Relationship status: Not on file  Other Topics Concern  . Not on file  Social History Narrative  . Not on file   Additional Social History:    Pain Medications: See home med list Prescriptions: See  home med list Over the Counter: See home med list History of alcohol / drug use?: Yes Negative Consequences of Use: Personal relationships, Financial Name of Substance 1: cocaine 1 - Age of First Use: unknown 1 - Amount (size/oz): couple of grams 1 - Frequency: a couple of times a week 1 - Duration: unknown 1 - Last Use / Amount: 2 days ago Name of Substance 2: alcohol 2 - Age of First Use: unknown 2 - Amount (size/oz): 1-2 40 oz  beers 2 - Frequency: once a week 2 - Last Use / Amount: "last Wednesday"                Sleep: Good  Appetite:  Good  Current Medications: Current Facility-Administered Medications  Medication Dose Route Frequency Provider Last Rate Last Dose  . acetaminophen (TYLENOL) tablet 650 mg  650 mg Oral Q6H PRN Nira Conn A, NP   650 mg at 12/30/17 2225  . alum & mag hydroxide-simeth (MAALOX/MYLANTA) 200-200-20 MG/5ML suspension 30 mL  30 mL Oral Q4H PRN Nira Conn A, NP      . aspirin EC tablet 325 mg  325 mg Oral Daily Nira Conn A, NP   325 mg at 12/31/17 0818  . furosemide (LASIX) tablet 40 mg  40 mg Oral Daily Nira Conn A, NP   40 mg at 12/31/17 0819  . insulin aspart (novoLOG) injection 0-15 Units  0-15 Units Subcutaneous TID WC Jackelyn Poling, NP   3 Units at 12/31/17 0636  . insulin aspart (novoLOG) injection 0-5 Units  0-5 Units Subcutaneous QHS Nira Conn A, NP   4 Units at 12/29/17 2131  . lisinopril (PRINIVIL,ZESTRIL) tablet 5 mg  5 mg Oral Daily Nira Conn A, NP   5 mg at 12/31/17 0819  . magnesium hydroxide (MILK OF MAGNESIA) suspension 30 mL  30 mL Oral Daily PRN Jackelyn Poling, NP      . Melatonin TABS 6 mg  6 mg Oral QHS PRN Nira Conn A, NP      . metFORMIN (GLUCOPHAGE) tablet 1,000 mg  1,000 mg Oral BID WC Nira Conn A, NP   1,000 mg at 12/31/17 0819  . metoprolol tartrate (LOPRESSOR) tablet 50 mg  50 mg Oral BID Nira Conn A, NP   50 mg at 12/31/17 1610  . naltrexone (DEPADE) tablet 50 mg  50 mg Oral Daily Nira Conn A, NP   50 mg at 12/31/17 0819  . nicotine (NICODERM CQ - dosed in mg/24 hours) patch 21 mg  21 mg Transdermal Daily Nira Conn A, NP      . potassium chloride SA (K-DUR,KLOR-CON) CR tablet 20 mEq  20 mEq Oral Daily Nira Conn A, NP   20 mEq at 12/31/17 0819  . sertraline (ZOLOFT) tablet 100 mg  100 mg Oral Daily Malvin Johns, MD   100 mg at 12/31/17 0819  . traZODone (DESYREL) tablet 150 mg  150 mg Oral QHS Nira Conn A, NP   150  mg at 12/30/17 2225    Lab Results:  Results for orders placed or performed during the  hospital encounter of 12/29/17 (from the past 48 hour(s))  Glucose, capillary     Status: Abnormal   Collection Time: 12/29/17  8:25 AM  Result Value Ref Range   Glucose-Capillary 275 (H) 70 - 99 mg/dL  Glucose, capillary     Status: Abnormal   Collection Time: 12/29/17 12:03 PM  Result Value Ref Range   Glucose-Capillary 202 (H) 70 - 99 mg/dL  Glucose, capillary     Status: Abnormal   Collection Time: 12/29/17  5:08 PM  Result Value Ref Range   Glucose-Capillary 273 (H) 70 - 99 mg/dL  Glucose, capillary     Status: Abnormal   Collection Time: 12/29/17  8:51 PM  Result Value Ref Range   Glucose-Capillary 319 (H) 70 - 99 mg/dL  Glucose, capillary     Status: Abnormal   Collection Time: 12/30/17  6:12 AM  Result Value Ref Range   Glucose-Capillary 211 (H) 70 - 99 mg/dL   Comment 1 Notify RN   Glucose, capillary     Status: Abnormal   Collection Time: 12/30/17 12:12 PM  Result Value Ref Range   Glucose-Capillary 264 (H) 70 - 99 mg/dL  Glucose, capillary     Status: Abnormal   Collection Time: 12/30/17  6:14 PM  Result Value Ref Range   Glucose-Capillary 305 (H) 70 - 99 mg/dL  Glucose, capillary     Status: Abnormal   Collection Time: 12/30/17  7:47 PM  Result Value Ref Range   Glucose-Capillary 308 (H) 70 - 99 mg/dL  Glucose, capillary     Status: Abnormal   Collection Time: 12/30/17  9:01 PM  Result Value Ref Range   Glucose-Capillary 186 (H) 70 - 99 mg/dL  Glucose, capillary     Status: Abnormal   Collection Time: 12/31/17  5:56 AM  Result Value Ref Range   Glucose-Capillary 178 (H) 70 - 99 mg/dL    Blood Alcohol level:  Lab Results  Component Value Date   ETH <10 12/28/2017   ETH <10 09/29/2017    Metabolic Disorder Labs: Lab Results  Component Value Date   HGBA1C 10.0 (H) 10/01/2017   MPG 240.3 10/01/2017   MPG 165.68 06/17/2017   No results found for: PROLACTIN Lab  Results  Component Value Date   CHOL 209 (H) 10/01/2017   TRIG 128 10/01/2017   HDL 32 (L) 10/01/2017   CHOLHDL 6.5 10/01/2017   VLDL 26 10/01/2017   LDLCALC 151 (H) 10/01/2017   LDLCALC 146 (H) 06/19/2017    Physical Findings: AIMS: Facial and Oral Movements Muscles of Facial Expression: None, normal Lips and Perioral Area: None, normal Jaw: None, normal Tongue: None, normal,Extremity Movements Upper (arms, wrists, hands, fingers): None, normal Lower (legs, knees, ankles, toes): None, normal, Trunk Movements Neck, shoulders, hips: None, normal, Overall Severity Severity of abnormal movements (highest score from questions above): None, normal Incapacitation due to abnormal movements: None, normal Patient's awareness of abnormal movements (rate only patient's report): No Awareness, Dental Status Current problems with teeth and/or dentures?: No Does patient usually wear dentures?: No  CIWA:  CIWA-Ar Total: 3 COWS:     Musculoskeletal: Strength & Muscle Tone: within normal limits Gait & Station: normal Patient leans: N/A  Psychiatric Specialty Exam: Physical Exam  ROS  Blood pressure 103/81, pulse 96, temperature 98.1 F (36.7 C), temperature source Oral, resp. rate 14, height 6' (1.829 m), weight 86.6 kg.Body mass index is 25.9 kg/m.  General Appearance: Casual  Eye Contact:  Good  Speech:  Clear  and Coherent  Volume:  Decreased  Mood:  Dysphoric  Affect:  Congruent  Thought Process:  Goal Directed  Orientation:  Full (Time, Place, and Person)  Thought Content:  Logical  Suicidal Thoughts:  No  Homicidal Thoughts:  No  Memory:  Immediate;   Fair  Judgement:  Fair  Insight:  Fair  Psychomotor Activity:  Normal  Concentration:  Concentration: Fair  Recall:  [employer]Fair  Fund of Knowledge:  Fair  Language:  Good  Akathisia:  Negative  Handed:  Right  AIMS (if indicated):     Assets:  Communication Skills Desire for Improvement  ADL's:  Intact  Cognition:  WNL   Sleep:  Number of Hours: 6.75    Probable discharge tomorrow  Treatment Plan Summary: Daily contact with patient to assess and evaluate symptoms and progress in treatment and Medication management  Mayrin Schmuck, MD 12/31/2017, 8:24 AM

## 2017-12-31 NOTE — Progress Notes (Signed)
Inpatient Diabetes Program Recommendations  AACE/ADA: New Consensus Statement on Inpatient Glycemic Control (2015)  Target Ranges:  Prepandial:   less than 140 mg/dL      Peak postprandial:   less than 180 mg/dL (1-2 hours)      Critically ill patients:  140 - 180 mg/dL   Results for Marcus Cummings, Marcus Cummings. (MRN [id number]) as of 12/31/2017 08:14  Ref. Range 12/30/2017 06:12 12/30/2017 12:12 12/30/2017 18:14 12/30/2017 19:47 12/30/2017 21:01  Glucose-Capillary Latest Ref Range: 70 - 99 mg/dL 914211 (H)  5 units NOVOLOG  264 (H)  8 units NOVOLOG  305 (H)  11 units NOVOLOG  308 (H) 186 (H)   Results for Marcus Cummings, Marcus Cummings. (MRN [id number]) as of 12/31/2017 08:14  Ref. Range 12/31/2017 05:56  Glucose-Capillary Latest Ref Range: 70 - 99 mg/dL 086178 (H)    Admit with: Severe recurrent major depression without psychotic features   History: DM  Home DM Meds: Lantus 30 units QHS                             Metformin 1000 mg BID  Current Orders: Novolog Moderate Correction Scale/ SSI (0-15 units) TID AC + HS                            Metformin 1000 mg BID     MD- Please consider starting Lantus 20 units QHS (70% total home dose)     --Will follow patient during hospitalization--  Ambrose FinlandJeannine Johnston Marline Morace RN, MSN, CDE Diabetes Coordinator Inpatient Glycemic Control Team Team Pager: [phone] (8a-5p)

## 2017-12-31 NOTE — Plan of Care (Signed)
Patient remains safe on the unit.  Problem: Coping: Goal: Ability to verbalize frustrations and anger appropriately will improve Outcome: Progressing Goal: Ability to demonstrate self-control will improve Outcome: Progressing   Problem: Safety: Goal: Periods of time without injury will increase Outcome: Progressing   Problem: Activity: Goal: Sleeping patterns will improve Outcome: Not Progressing

## 2017-12-31 NOTE — Progress Notes (Signed)
D:  Marcus Cummings was in his room much of the evening.  He did not attend evening AA group.  He continues to voice passive SI and agrees to seek out staff if that worsens.  He denied HI or A/V hallucinations.  He complained of chronic bilateral leg pain and tylenol was given with good relief.  He is currently resting with his eyes closed and appears to be asleep. A:  1:1 with RN for support and encouragement.  Medications as ordered.  Q 15 minute checks maintained for safety.  Encouraged participation in group and unit activities.   R:  Marcus Cummings remains safe on the unit.  We will continue to monitor the progress towards his goals.

## 2017-12-31 NOTE — Plan of Care (Signed)
  Problem: Medication: Goal: Compliance with prescribed medication regimen will improve Outcome: Progressing Note:  Marcus Cummings takes his medications as prescribed.  He will ask for prn medication.

## 2017-12-31 NOTE — Progress Notes (Signed)
Psychoeducational Group Note  Date:  12/31/2017 Time:  2045  Group Topic/Focus:  wrap up group  Participation Level: Did Not Attend  Participation Quality:  Not Applicable  Affect:  Not Applicable  Cognitive:  Not Applicable  Insight:  Not Applicable  Engagement in Group: Not Applicable  Additional Comments:  Pt remained in bed asleep during group time.   Johann CapersMcNeil, Eren Ryser S 12/31/2017, 11:06 PM

## 2018-01-01 LAB — GLUCOSE, CAPILLARY
Glucose-Capillary: 201 mg/dL — ABNORMAL HIGH (ref 70–99)
Glucose-Capillary: 366 mg/dL — ABNORMAL HIGH (ref 70–99)
Glucose-Capillary: 213 mg/dL — ABNORMAL HIGH (ref 70–99)
Glucose-Capillary: 150 mg/dL — ABNORMAL HIGH (ref 70–99)

## 2018-01-01 MED ORDER — METOPROLOL TARTRATE 50 MG PO TABS: 50.0000 mg | ORAL_TABLET | Freq: Two times a day (BID) | ORAL | 0 refills | Status: DC

## 2018-01-01 MED ORDER — ASPIRIN 325 MG PO TBEC: 325.0000 mg | DELAYED_RELEASE_TABLET | Freq: Every day | ORAL | 0 refills | Status: DC

## 2018-01-01 MED ORDER — NICOTINE 21 MG/24HR TD PT24: 21.0000 mg | MEDICATED_PATCH | Freq: Every day | TRANSDERMAL | 0 refills | Status: DC

## 2018-01-01 MED ORDER — SERTRALINE HCL 100 MG PO TABS: 100.0000 mg | ORAL_TABLET | Freq: Every day | ORAL | 0 refills | Status: DC

## 2018-01-01 MED ORDER — FUROSEMIDE 40 MG PO TABS: 40.0000 mg | ORAL_TABLET | Freq: Every day | ORAL | 0 refills | Status: DC

## 2018-01-01 MED ORDER — INSULIN GLARGINE 100 UNIT/ML ~~LOC~~ SOLN: 30.0000 [IU] | Freq: Every day | SUBCUTANEOUS | 0 refills | Status: DC

## 2018-01-01 MED ORDER — LISINOPRIL 5 MG PO TABS: 5.0000 mg | ORAL_TABLET | Freq: Every day | ORAL | 0 refills | Status: DC

## 2018-01-01 MED ORDER — POTASSIUM CHLORIDE CRYS ER 20 MEQ PO TBCR: 20.0000 meq | EXTENDED_RELEASE_TABLET | Freq: Every day | ORAL | 0 refills | Status: DC

## 2018-01-01 MED ORDER — MELATONIN 3 MG PO TABS: 6.0000 mg | ORAL_TABLET | Freq: Every evening | ORAL | 0 refills | Status: DC | PRN

## 2018-01-01 MED ORDER — METFORMIN HCL 1000 MG PO TABS: 1000.0000 mg | ORAL_TABLET | Freq: Two times a day (BID) | ORAL | 0 refills | Status: DC

## 2018-01-01 MED ORDER — TRAZODONE HCL 150 MG PO TABS: 150.0000 mg | ORAL_TABLET | Freq: Every day | ORAL | 0 refills | Status: DC

## 2018-01-01 MED ORDER — NALTREXONE HCL 50 MG PO TABS: 50.0000 mg | ORAL_TABLET | Freq: Every day | ORAL | 0 refills | Status: DC

## 2018-01-01 MED ORDER — ATORVASTATIN CALCIUM 80 MG PO TABS: 80.0000 mg | ORAL_TABLET | Freq: Every day | ORAL | 0 refills | Status: DC

## 2018-01-01 NOTE — Progress Notes (Signed)
Adult Psychoeducational Group Note  Date:  01/01/2018 Time:  1:16 PM  Group Topic/Focus:  Goals Group:   The focus of this group is to help patients establish daily goals to achieve during treatment and discuss how the patient can incorporate goal setting into their daily lives to aide in recovery.  Participation Level:  Active  Participation Quality:  Appropriate  Affect:  Appropriate  Cognitive:  Appropriate  Insight: Appropriate  Engagement in Group:  Engaged  Modes of Intervention:  Discussion  Additional Comments:  Pt attended morning group.  Deforest Hoyleslexandre J Lue [place] 01/01/2018, 1:16 PM

## 2018-01-01 NOTE — Plan of Care (Addendum)
  Problem: Coping: Goal: Ability to verbalize frustrations and anger appropriately will improve Outcome: Progressing Goal: Ability to demonstrate self-control will improve Outcome: Progressing   D: Pt alert and oriented on the unit. Pt engaging with RN staff and other pts. Pt denies SI/HI, A/VH. Pt's affect was flat and mood depressed. Pt also participated during unit groups and activities. Pt is pleasant and cooperative. A: Education, support and encouragement provided, q15 minute safety checks remain in effect. Medications administered per MD orders. R: No reactions/side effects to medicine noted. Pt denies any concerns at this time, and verbally contracts for safety. Pt ambulating on the unit with no issues. Pt remains safe on and off the unit.

## 2018-01-01 NOTE — [facility] Suicide Risk Assessment (Signed)
[facility]   Principal Problem: Depression/safety concerns/substance abuse Discharge Diagnoses: Active Problems:   Severe recurrent major depression without psychotic features (HCC)   Total Time spent with patient: 45 minutes Musculoskeletal: Strength & Muscle Tone: within normal limits Gait & Station: normal Patient leans: N/A  Psychiatric Specialty Exam: Physical Exam  ROS  Blood pressure 102/67, pulse 88, temperature 98.7 F (37.1 C), temperature source Oral, resp. rate 18, height 6' (1.829 m), weight 86.6 kg.Body mass index is 25.9 kg/m.  General Appearance: Casual  Eye Contact:  Fair  Speech:  Clear and Coherent  Volume:  Normal  Mood:  Depressed  Affect:  Appropriate  Thought Process:  Goal Directed  Orientation:  Full (Time, Place, and Person)  Thought Content:  Logical  Suicidal Thoughts:  No  Homicidal Thoughts:  No  Memory:  Immediate;   Good  Judgement:  Good  Insight:  Fair  Psychomotor Activity:  Normal  Concentration:  Concentration: Good  Recall:  Good  Fund of Knowledge:  Good  Language:  Good  Akathisia:  Negative  Handed:  Right  AIMS (if indicated):     Assets:  Communication Skills  ADL's:  Intact  Cognition:  WNL  Sleep:  Number of Hours: 6.75   Mental Status Per Nursing Assessment::   On Admission:  Suicidal ideation indicated by patient, Self-harm thoughts  Demographic Factors:  Male  Loss Factors: Decrease in vocational status  Historical Factors: NA  Risk Reduction Factors:   NA  Continued Clinical Symptoms:  Depression:   Impulsivity  Cognitive Features That Contribute To Risk:  None    Suicide Risk:  Minimal: No identifiable suicidal ideation.  Patients presenting with no risk factors but with morbid ruminations; may be classified as minimal risk based on the severity of the depressive symptoms  Follow-up Information    [facility]   Why:  Please attend your hospital  follow up appointment on Tuesday, 01/05/18 at 11:00a. Please bring your current medications and any discharge paperwork from this hospitalization.  Contact information: [address], [facility] [place], [place] [address] Phone: [phone] Fax: [phone]          Plan Of Care/Follow-up recommendations:  Activity:  full  Brynleigh Sequeira, MD 01/01/2018, 8:18 AM

## 2018-01-01 NOTE — Progress Notes (Signed)
Pt initially attended AA group, however the pt left half way through group and went to bed.

## 2018-01-01 NOTE — Discharge Summary (Signed)
Physician Discharge Summary Note  Patient:  Marcus Cummings. is an 52 y.o., male  MRN:  [id number]  DOB:  May 26, 1965  Patient phone:  [phone] (home)   Patient address:   [address] [place] [address],   Total Time spent with patient: Greater than 30 minutes  Date of Admission:  12/29/2017  Date of Discharge: 01/02/18  Reason for Admission: Suicidal ideations & the need to come off drugs.   Principal Problem: Severe recurrent major depression without psychotic features [facility])  Discharge Diagnoses: Patient Active Problem List   Diagnosis Date Noted  . Severe recurrent major depression without psychotic features (HCC) [F33.2] 12/29/2017    Priority: High  . Cocaine abuse (HCC) [F14.10] 10/10/2017  . Schizoaffective disorder (HCC) [F25.9] 09/30/2017  . 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 Essential hypertension [I10]   . Dyslipidemia [E78.5]   . Chest pain on exertion [R07.9]   . Stable angina (HCC) [I20.8] 06/17/2017   Past Psychiatric History: History of prior psychiatric admissions, most recently, [facility] 3 months ago, the [place] [facility] for depression.  Reports history of prior suicidal attempts by overdose. Reports past , not recent, history of auditory hallucinations. States that in the past he has been diagnosed with Bipolar Disorder but currently does not endorse any clear history of hypomania/mania.  He has been prescribed Invega Sustenna 156 mg Q monthly and on Naltrexone 380 mgrs SUSR monthly.  History of alcohol use disorder  Past Medical History:  Past Medical History:  Diagnosis Date  . Depression   . Diabetes mellitus without complication (HCC)   . Hypertension   . MI, acute, non ST segment elevation (HCC) 2007   stents x 2 2007 [facility] [place])  . Stroke [facility]) 2017    Past Surgical History:  Procedure Laterality Date  . CORONARY ARTERY BYPASS GRAFT N/A 06/23/2017   Procedure: CORONARY ARTERY BYPASS GRAFTING (CABG) x 3; Using Left Internal Mammary  Artery and Right Leg Greater Saphenous Vein harvested endoscopically.;  Surgeon: Kerin Perna, MD;  Location: [facility] OR;  Service: Open Heart Surgery;  Laterality: N/A;  . LEFT HEART CATH AND CORONARY ANGIOGRAPHY N/A 06/18/2017   Procedure: LEFT HEART CATH AND CORONARY ANGIOGRAPHY;  Surgeon: Lennette Bihari, MD;  Location: [facility] INVASIVE CV LAB;  Service: Cardiovascular;  Laterality: N/A;  . TEE WITHOUT CARDIOVERSION N/A 06/23/2017   Procedure: TRANSESOPHAGEAL ECHOCARDIOGRAM (TEE);  Surgeon: Donata Clay, Theron Arista, MD;  Location: [facility] OR;  Service: Open Heart Surgery;  Laterality: N/A;   Family History:  Family History  Problem Relation Age of Onset  . CAD Mother   . Congestive Heart Failure Father    Family Psychiatric  History: See H&P  Social History:  Social History   Substance and Sexual Activity  Alcohol Use Yes   Comment: 1-2  beers a week     Social History   Substance and Sexual Activity  Drug Use Yes  . Types: Marijuana, Cocaine    Social History   Socioeconomic History  . Marital status: Single    Spouse name: Not on file  . Number of children: Not on file  . Years of education: Not on file  . Highest education level: Not on file  Occupational History  . Not on file  Social Needs  . Financial resource strain: Not on file  . Food insecurity:    Worry: Not on file    Inability: Not on file  . Transportation needs:    Medical:  Not on file    Non-medical: Not on file  Tobacco Use  . Smoking status: Current Every Day Smoker    Packs/day: 1.00    Types: Cigarettes  . Smokeless tobacco: Never Used  . Tobacco comment: Declined information  Substance and Sexual Activity  . Alcohol use: Yes    Comment: 1-2  beers a week  . Drug use: Yes    Types: Marijuana, Cocaine  . Sexual activity: Not Currently  Lifestyle  . Physical activity:    Days per week: Not on file    Minutes per session: Not on file  . Stress: Not on file  Relationships  . Social connections:    Talks  on phone: Not on file    Gets together: Not on file    Attends religious service: Not on file    Active member of club or organization: Not on file    Attends meetings of clubs or organizations: Not on file    Relationship status: Not on file  Other Topics Concern  . Not on file  Social History Narrative  . Not on file   Hospital Course: (Per Md's admission evaluation): This is a repeat admission for Marcus Cummings he has been admitted here as recently as September, has [facility]. In September he presented with fears that he was having a cardiac event complaining of chest pain but then acknowledge suicidal thinking and acknowledges numerous past diagnoses to include bipolar disorder, PTSD, substance abuse involving alcohol cocaine and cannabis, and was admitted for stabilization.  Apparently he did have a CVA in 2017 and had bypass surgery in 2019 and on this admission he once again presented initially complaining of medical symptoms. He presented through the emergency department stating he feared he was having a stroke but is MRI of the brain was normal.  He then confessed that he was having suicidal thoughts and would like to come off of drugs specifically cocaine alcohol and cannabis. He does not recall ever having a seizure or hallucinations when coming off of drugs. He states he has a place to live when I ask for issues of secondary gain he states he is welcome back at his own house but he knows there are drugs in the neighborhood. Though he reported suicidal thoughts he does not have a plan.  He is minimally engaged in the interview process. Sluggish sleeping in bed.  This is one of several discharge summaries for this 52 year old male with hx of polysubstance use disorder & multiple chronic medical issues. He came to the hospital this time seeking help getting off drugs. His UDS on admission was positive for cocaine & THC. He did not receive any  detoxification treatments as cocaine intoxication or withdrawal as of yet has no established detoxification treatment protocols. He was however, medicated for his symptoms of depression, anxiety & drug cravings. He received & was discharged on; Melatonin 6 mg for insomnia, Naltrexone 50 mg for cravings, Nicotine patch 21 mg for smoking cessation & Sertraline 100 mg for depression. He was resumed & discharged on all his pertinent home medications for those pre-existing medical issues presented. He tolerated his treatment regimen without any adverse effects or reactions reported. He was also enrolled & participated in the group counseling sessions being offered & held on this unit. He learned coping skills.  Marcus Cummings's symptoms responded well to his treatment regimen. This is evidenced by his reports of improved mood, absence of suicidal  ideations & absence of substance withdrawal symptoms. He presents mentally & medically stable to be discharged today to continue mental health care on an outpatient basis as noted below. He was provided with all the necessary information needed to make the appointment without problems.  Upon discharge Oris adamantly denies any SIHI, AVH, delusional thoughts or paranoia. He was provided with a 7 days worth supply samples of his [facility] discharge medications. He was able to engage in safety planning including plan to return to [facility] or contact emergency services if he feels unable to maintain his own safety or the safety of others. Pt had no further questions, comments or concerns.  He left [facility] in no apparent distress,    Physical Findings: AIMS: Facial and Oral Movements Muscles of Facial Expression: None, normal Lips and Perioral Area: None, normal Jaw: None, normal Tongue: None, normal,Extremity Movements Upper (arms, wrists, hands, fingers): None, normal Lower (legs, knees, ankles, toes): None, normal, Trunk Movements Neck, shoulders, hips: None, normal, Overall  Severity Severity of abnormal movements (highest score from questions above): None, normal Incapacitation due to abnormal movements: None, normal Patient's awareness of abnormal movements (rate only patient's report): No Awareness, Dental Status Current problems with teeth and/or dentures?: No Does patient usually wear dentures?: No  CIWA:  CIWA-Ar Total: 3 COWS:     Musculoskeletal: Strength & Muscle Tone: within normal limits Gait & Station: normal Patient leans: N/A  Psychiatric Specialty Exam: Physical Exam  Nursing note and vitals reviewed. Constitutional: He is oriented to person, place, and time. He appears well-developed and well-nourished.  HENT:  Head: Normocephalic.  Eyes: Pupils are equal, round, and reactive to light.  Neck: Normal range of motion.  Cardiovascular: Normal rate.  Respiratory: Effort normal.  GI: Soft.  Genitourinary:  Genitourinary Comments: Deferred  Musculoskeletal: Normal range of motion.  Neurological: He is alert and oriented to person, place, and time.  Skin: Skin is warm.    Review of Systems  Constitutional: Negative.   HENT: Negative.   Eyes: Negative.   Respiratory: Negative.  Negative for cough and shortness of breath.   Cardiovascular: Negative.  Negative for chest pain and palpitations.  Gastrointestinal: Negative.  Negative for abdominal pain, heartburn, nausea and vomiting.  Genitourinary: Negative.   Musculoskeletal: Negative.   Skin: Negative.   Neurological: Negative.  Negative for dizziness and headaches.  Endo/Heme/Allergies: Negative.   Psychiatric/Behavioral: Positive for depression (Stable) and substance abuse (Hx. Cocaine & THC use disorder). Negative for hallucinations, memory loss and suicidal ideas. The patient has insomnia (Stable). The patient is not nervous/anxious (Stable).     Blood pressure (!) 89/70, pulse 86, temperature 98.3 F (36.8 C), temperature source Oral, resp. rate 14, height 6' (1.829 m), weight  86.6 kg.Body mass index is 25.9 kg/m.  See Md's discharge SRA   Have you used any form of tobacco in the last 30 days? (Cigarettes, Smokeless Tobacco, Cigars, and/or Pipes): Yes  Has this patient used any form of tobacco in the last 30 days? (Cigarettes, Smokeless Tobacco, Cigars, and/or Pipes): Yes, A an FDA-approved tobacco cessation medication was offered at discharge.  Blood Alcohol level:  Lab Results  Component Value Date   ETH <10 12/28/2017   ETH <10 09/29/2017    Metabolic Disorder Labs:  Lab Results  Component Value Date   HGBA1C 10.0 (H) 10/01/2017   MPG 240.3 10/01/2017   MPG 165.68 06/17/2017   No results found for: PROLACTIN Lab Results  Component Value Date   CHOL 209 (H)  10/01/2017   TRIG 128 10/01/2017   HDL 32 (L) 10/01/2017   CHOLHDL 6.5 10/01/2017   VLDL 26 10/01/2017   LDLCALC 151 (H) 10/01/2017   LDLCALC 146 (H) 06/19/2017   See Psychiatric Specialty Exam and Suicide Risk Assessment completed by Attending Physician prior to discharge.  Discharge destination:  Home  Is patient on multiple antipsychotic therapies at discharge:  No   Has Patient had three or more failed trials of antipsychotic monotherapy by history:  No  Recommended Plan for Multiple Antipsychotic Therapies: NA  Allergies as of 01/01/2018      Reactions   Zyprexa [olanzapine] Other (See Comments)   unknown      Medication List    STOP taking these medications   insulin glargine 100 UNIT/ML injection Commonly known as:  LANTUS   paliperidone 234 MG/1.5ML Susy injection Commonly known as:  INVEGA SUSTENNA   paliperidone 3 MG 24 hr tablet Commonly known as:  INVEGA     TAKE these medications     Indication  aspirin 325 MG EC tablet Take 1 tablet (325 mg total) by mouth daily. (May buy from over the counter): For heart health. What changed:  additional instructions  Indication:  Heart health   atorvastatin 80 MG tablet Commonly known as:  LIPITOR Take 1 tablet (80  mg total) by mouth daily at 6 PM. For high cholesterol What changed:  additional instructions  Indication:  Inherited Heterozygous Hypercholesterolemia, High Amount of Fats in the Blood   furosemide 40 MG tablet Commonly known as:  LASIX Take 1 tablet (40 mg total) by mouth daily. For swellings What changed:  additional instructions  Indication:  Edema, High Blood Pressure Disorder   lisinopril 5 MG tablet Commonly known as:  PRINIVIL,ZESTRIL Take 1 tablet (5 mg total) by mouth daily. For high blood pressure Start taking on:  01/02/2018  Indication:  High Blood Pressure Disorder   Melatonin 3 MG Tabs Take 2 tablets (6 mg total) by mouth at bedtime as needed (for sleep).  Indication:  Trouble Sleeping   metFORMIN 1000 MG tablet Commonly known as:  GLUCOPHAGE Take 1 tablet (1,000 mg total) by mouth 2 (two) times daily with a meal. For diabetes What changed:  additional instructions  Indication:  Type 2 Diabetes   metoprolol tartrate 50 MG tablet Commonly known as:  LOPRESSOR Take 1 tablet (50 mg total) by mouth 2 (two) times daily. For high blood pressure What changed:  additional instructions  Indication:  High Blood Pressure Disorder   naltrexone 50 MG tablet Commonly known as:  DEPADE Take 1 tablet (50 mg total) by mouth daily. For alcohol/drug addiction What changed:  additional instructions  Indication:  Excessive Use of Alcohol   nicotine 21 mg/24hr patch Commonly known as:  NICODERM CQ - dosed in mg/24 hours Place 1 patch (21 mg total) onto the skin daily. (May buy from over the counter): For smoking cessation Start taking on:  01/02/2018  Indication:  Nicotine Addiction   potassium chloride SA 20 MEQ tablet Commonly known as:  K-DUR,KLOR-CON Take 1 tablet (20 mEq total) by mouth daily. For low potassium What changed:  additional instructions  Indication:  Low Amount of Potassium in the Blood   sertraline 100 MG tablet Commonly known as:  ZOLOFT Take 1 tablet  (100 mg total) by mouth daily. Start taking on:  01/02/2018 What changed:    medication strength  how much to take  additional instructions  Indication:  Major Depressive Disorder  traZODone 150 MG tablet Commonly known as:  DESYREL Take 1 tablet (150 mg total) by mouth at bedtime. For sleep What changed:  additional instructions  Indication:  Trouble Sleeping, mood stability      Follow-up Information    [facility] [place]. Go on 01/05/2018.   Why:  Please attend your hospital follow up appointment on Tuesday, 01/05/18 at 11:00a. Please bring your current medications and any discharge paperwork from this hospitalization.  Contact information: [address], [facility] [place], [place] [address] Phone: [phone] ext [phone] Fax: [phone]         Follow-up recommendations: Activity:  As tolerated Diet: As recommended by your primary care doctor. Keep all scheduled follow-up appointments as recommended.   Comments:  Patient is instructed prior to discharge to: Take all medications as prescribed by his/her mental healthcare provider. Report any adverse effects and or reactions from the medicines to his/her outpatient provider promptly. Patient has been instructed & cautioned: To not engage in alcohol and or illegal drug use while on prescription medicines. In the event of worsening symptoms, patient is instructed to call the crisis hotline, 911 and or go to the nearest ED for appropriate evaluation and treatment of symptoms. To follow-up with his/her primary care provider for your other medical issues, concerns and or health care needs.   Signed: Armandina Stammer, NP 01/01/2018, 12:18 PM

## 2018-01-01 NOTE — Progress Notes (Signed)
D:  Marcus Cummings was in his room all evening.  He continued to voice suicidal ideation but will see out staff if the thoughts worsen.  He denied HI or A/V hallucinations.  He reported his day was "ok" and that he stayed in bed all day.  Minimal interaction with staff.  He continued to complain of chronic leg pain and tylenol given with good relief.  He took his hs medication without difficulty.  HS blood sugar was 218 and 2 units of Novolog required.  He is currently resting with his eyes closed and appears to be asleep. A:  1:1 with RN for support and encouragement.  Medications as ordered.  Q 15 minute checks maintained for safety.  Encouraged participation in group and unit activities.   R:  Marcus Cummings remains safe on the unit.  We will continue to monitor the progress towards his goals.

## 2018-01-01 NOTE — Progress Notes (Signed)
  [facility] Discharge [facility] :  Will you be returning to the same living situation after discharge:  Yes,  home At discharge, do you have transportation home?: Yes,  bus Do you have the ability to pay for your medications: Yes,  mental health/VA connected.   Release of information consent forms completed and submitted to medical records by CSW.   Patient to Follow up at: Follow-up Information    [facility]. Go on 01/05/2018.   Why:  Please attend your hospital follow up appointment on Tuesday, 01/05/18 at 11:00a. Please bring your current medications and any discharge paperwork from this hospitalization.  Contact information: [address], [facility] [place], [place] [address] Phone: [phone] ext [phone] Fax: [phone]          Next level of care provider has access to [facility] Link:no  Safety Planning and Suicide Prevention discussed: Yes,  SPE completed with pt; pt declined to consent to collateral contact.   Have you used any form of tobacco in the last 30 days? (Cigarettes, Smokeless Tobacco, Cigars, and/or Pipes): Yes  Has patient been referred to the Quitline?: Patient refused referral  Patient has been referred for addiction treatment: Yes  Rona RavensHeather S Emmaclaire Switala, LCSW 01/01/2018, 11:15 AM

## 2018-01-01 NOTE — Plan of Care (Signed)
  Problem: Education: Goal: Mental status will improve Outcome: Not Progressing Note:  Marcus Cummings continues to report suicidal ideation.  Encouraged him to come out of his room and participate in groups.

## 2018-01-01 NOTE — Progress Notes (Signed)
D.  Pt in bed on approach, attended part of group then left and went to bed.  Pt was to be discharged tonight but ride didn't show up and was unable to be contacted.  Pt denies SI/HI/AVH at this time, but did endorse passive SI on day shift.  A.  Support and encouragement offered  R. Pt remains safe on the unit, will continue to monitor.

## 2018-01-02 ENCOUNTER — Emergency Department (HOSPITAL_COMMUNITY): Payer: Non-veteran care

## 2018-01-02 ENCOUNTER — Emergency Department (HOSPITAL_COMMUNITY)
Admission: EM | Admit: 2018-01-02 | Discharge: 2018-01-03 | Disposition: A | Discharge status: Home / Self Care | Payer: Non-veteran care | Attending: Emergency Medicine | Admitting: Emergency Medicine

## 2018-01-02 ENCOUNTER — Other Ambulatory Visit: Payer: Self-pay

## 2018-01-02 ENCOUNTER — Encounter (HOSPITAL_COMMUNITY): Payer: Self-pay

## 2018-01-02 DIAGNOSIS — F209 Schizophrenia, unspecified: Secondary | ICD-10-CM | POA: Insufficient documentation

## 2018-01-02 DIAGNOSIS — F17210 Nicotine dependence, cigarettes, uncomplicated: Secondary | ICD-10-CM | POA: Insufficient documentation

## 2018-01-02 DIAGNOSIS — Z951 Presence of aortocoronary bypass graft: Secondary | ICD-10-CM | POA: Insufficient documentation

## 2018-01-02 DIAGNOSIS — F1420 Cocaine dependence, uncomplicated: Secondary | ICD-10-CM | POA: Insufficient documentation

## 2018-01-02 DIAGNOSIS — R45851 Suicidal ideations: Secondary | ICD-10-CM | POA: Insufficient documentation

## 2018-01-02 DIAGNOSIS — I10 Essential (primary) hypertension: Secondary | ICD-10-CM | POA: Insufficient documentation

## 2018-01-02 HISTORY — DX: Suicidal ideations: R45.851

## 2018-01-02 LAB — RAPID URINE DRUG SCREEN, HOSP PERFORMED
Amphetamines: NOT DETECTED
Barbiturates: NOT DETECTED
Benzodiazepines: NOT DETECTED
Cocaine: POSITIVE — AB
Opiates: NOT DETECTED
TETRAHYDROCANNABINOL: NOT DETECTED

## 2018-01-02 LAB — ACETAMINOPHEN LEVEL: Acetaminophen (Tylenol), Serum: <10 ug/mL — ABNORMAL LOW (ref 10–30)

## 2018-01-02 LAB — CBC
HEMATOCRIT: 38.7 % — AB (ref 39.0–52.0)
HEMOGLOBIN: 12.8 g/dL — AB (ref 13.0–17.0)
MCH: 30 pg (ref 26.0–34.0)
MCHC: 33.1 g/dL (ref 30.0–36.0)
MCV: 90.6 fL (ref 80.0–100.0)
Platelets: 266 10*3/uL (ref 150–400)
RBC: 4.27 MIL/uL (ref 4.22–5.81)
RDW: 14.6 % (ref 11.5–15.5)
WBC: 6.6 10*3/uL (ref 4.0–10.5)
nRBC: 0 % (ref 0.0–0.2)

## 2018-01-02 LAB — BASIC METABOLIC PANEL
ANION GAP: 14 (ref 5–15)
BUN: 22 mg/dL — ABNORMAL HIGH (ref 6–20)
CHLORIDE: 96 mmol/L — AB (ref 98–111)
CO2: 20 mmol/L — ABNORMAL LOW (ref 22–32)
Calcium: 9 mg/dL (ref 8.9–10.3)
Creatinine, Ser: 1.47 mg/dL — ABNORMAL HIGH (ref 0.61–1.24)
GFR calc Af Amer: >60 mL/min (ref >60)
GFR calc non Af Amer: 55 mL/min — ABNORMAL LOW (ref >60)
Glucose, Bld: 305 mg/dL — ABNORMAL HIGH (ref 70–99)
Potassium: 4.8 mmol/L (ref 3.5–5.1)
Sodium: 130 mmol/L — ABNORMAL LOW (ref 135–145)

## 2018-01-02 LAB — ETHANOL: Alcohol, Ethyl (B): <10 mg/dL (ref <10)

## 2018-01-02 LAB — I-STAT TROPONIN, ED: Troponin i, poc: 0.01 ng/mL (ref 0.00–0.08)

## 2018-01-02 LAB — SALICYLATE LEVEL: Salicylate Lvl: <7 mg/dL (ref 2.8–30.0)

## 2018-01-02 LAB — GLUCOSE, CAPILLARY: Glucose-Capillary: 165 mg/dL — ABNORMAL HIGH (ref 70–99)

## 2018-01-02 NOTE — ED Notes (Signed)
TTS attempted to assessment patient. Thayer Ohmhris, RN, stated they have no room to place him in for an assessment. Due to privacy patient unable to be assessed until placed in a room.

## 2018-01-02 NOTE — Progress Notes (Signed)
Patient ID: Velvet BatheGregory L Mackins Sr., male   DOB: 04-04-1965, 52 y.o.   MRN: [id number]   D: Pt alert and oriented on the unit.   A: Education, support, and encouragement provided. Discharge summary, medications and follow up appointments reviewed with pt. Suicide prevention resources provided, including "My 3 App." Pt's belongings in locker # 01 returned and belongings sheet signed.  R: Pt denies SI/HI, A/VH, pain, or any concerns at this time. Pt ambulatory on and off unit. Pt discharged to lobby.

## 2018-01-02 NOTE — ED Triage Notes (Addendum)
Per [facility], pt complains of Central CP with shob that began this afternoon. Hx of cabg x 3, 2 nitro given with relief. Also given 324asa. bolus given due to drop in BP after nitro. BP 106/60, HR 66 NSR, RR 16, CBG 301 hx of dm. Pt is homeless and the shelter he was picked up from is closed for the night. Also endorses SI/HI, states he has a plan of "i'm gonna cut my wrist" and when asked about HI he stated "I want to hurt pretty much anybody"

## 2018-01-02 NOTE — BH Assessment (Signed)
[facility] Assessment Progress Note  Pt is recommended for continued observation to be reassessed in the AM by psych due to hx of SI and current plan to cut his wrists. [facility] and returns to the ED several hours later citing SI and HI. EDP Eber HongMiller, Brian, MD and pt's nurse Annia FriendlySusanna, RN have been advised.

## 2018-01-02 NOTE — ED Notes (Signed)
Pt resting with eyes closed at this time.  No complaints voiced. 

## 2018-01-02 NOTE — ED Notes (Signed)
Patient transported to X-ray 
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
## 2018-01-02 NOTE — ED Notes (Signed)
Pt aware of need for urine sample, urinal at bedside 

## 2018-01-02 NOTE — BH Assessment (Addendum)
Tele Assessment Note   Patient Name: Marcus Cummings. MRN: [id number] Referring Physician: Eber Hong, MD Location of [facility]: [facility] Location of Provider: Behavioral Health TTS Department  Marcus Bathe Cummings. is an 52 y.o. male who presents to the ED voluntarily. Pt was d/c on 01/02/18 from [facility] where he was hospitalized for inpt treatment due to Severe recurrent major depression without psychotic features. Pt returns to the ED several hours after being [facility] stating he is suicidal with a plan to cut his wrists. Pt also reports HI towards "anyone that makes me mad." Pt is also citing AH that tell him he is not worth anything. Pt states he has been in [place] for the past year. Pt states he moved from [place] after being hospitalized in [place]. [facility]. Pt states he takes psych meds but he cannot recall the name of the medication or the last time he visited his psychiatrist. Pt endorses cocaine use and states he uses several times a month. Pt states he cannot contract for safety at this time.  Pt is recommended for continued observation to be reassessed in the AM by psych due to hx of SI and current plan to cut his wrists. Pt just d/c from [facility] earlier this date and returns to the ED several hours later citing SI and HI. EDP Marcus Hong, MD and pt's nurse Annia Friendly, RN have been advised.   Diagnosis: Schizophrenia; Cocaine use disorder, severe   Past Medical History:  Past Medical History:  Diagnosis Date  . Depression   . Diabetes mellitus without complication (HCC)   . Hypertension   . MI, acute, non ST segment elevation (HCC) 2007   stents x 2 2007 [facility] [place])  . Stroke (HCC) 2017  . Suicidal ideation     Past Surgical History:  Procedure Laterality Date  . CORONARY ARTERY BYPASS GRAFT N/A 06/23/2017   Procedure: CORONARY ARTERY BYPASS GRAFTING (CABG) x 3; Using Left Internal Mammary Artery and Right Leg Greater  Saphenous Vein harvested endoscopically.;  Surgeon: Marcus Perna, MD;  Location[facility] OR;  Service: Open Heart Surgery;  Laterality: N/A;  . LEFT HEART CATH AND CORONARY ANGIOGRAPHY N/A 06/18/2017   Procedure: LEFT HEART CATH AND CORONARY ANGIOGRAPHY;  Surgeon: Marcus Bihari, MD;  [facility] INVAS[facility] CV LAB;  Service: Cardiovascular;  Laterality: N/A;  . TEE WITHOUT CARDIOVERSION N/A 06/23/2017   Procedure: TRANSESOPHAGEAL ECHOCARDIOGRAM (TEE);  Surgeon: Marcus Cummings, Marcus Arista, MD;  Location: [facility] OR;  Service: Open Heart Surgery;  Laterality: N/A;    Family History:  Family History  Problem Relation Age of Onset  . CAD Mother   . Congestive Heart Failure Father     Social History:  reports that he has been smoking cigarettes. He has been smoking about 1.00 pack per day. He has never used smokeless tobacco. He reports that he drinks alcohol. He reports that he has current or past drug history. Drugs: Marijuana and Cocaine.  Additional Social History:  Alcohol / Drug Use Pain Medications: See MAR Prescriptions: See MAR Over the Counter: See MAR History of alcohol / drug use?: Yes Substance #1 Name of Substance 1: cocaine 1 - Age of First Use: 23 1 - Amount (size/oz): 1 gram 1 - Frequency: 2-3 times a month 1 - Duration: ongoing 1 - Last Use / Amount: pt reports 1 week ago  CIWA: CIWA-Ar BP: 114/76 Pulse Rate: 64 COWS:  Allergies:  Allergies  Allergen Reactions  . Zyprexa [Olanzapine] Other (See Comments)    unknown    Home Medications:  (Not in a hospital admission)  OB/GYN Status:  No LMP for male patient.  General Assessment Data Assessment unable to be completed: Yes Reason for not completing assessment: NEEDS TO BE SEEN. TTS attempted to assessment patient. Thayer Ohm, RN, stated they have no room to place him in for an assessment. Due to privacy patient unable to be assessed until placed in a room. Location of Assessment: [facility] ED TTS Assessment: In system Is this a Tele  or Face-to-Face Assessment?: Tele Assessment Is this an Initial Assessment or a Re-assessment for this encounter?: Initial Assessment Patient Accompanied by:: (alone) Language Other than English: No What gender do you identify as?: Male Marital status: Divorced Pregnancy Status: No Living Arrangements: Alone Can pt return to current living arrangement?: Yes Admission Status: Voluntary Is patient capable of signing voluntary admission?: Yes Referral Source: Self/Family/Friend Insurance type: [facility]     Crisis Care Plan Living Arrangements: Alone Name of Psychiatrist: [place] [facility]rapist: [facility]  Education Status Is patient currently in school?: No Is the patient employed, unemployed or receiving disability?: Receiving disability income  Risk to self with the past 6 months Suicidal Ideation: Yes-Currently Present Has patient been a risk to self within the past 6 months prior to admission? : No Suicidal Intent: No Has patient had any suicidal intent within the past 6 months prior to admission? : No Is patient at risk for suicide?: Yes Suicidal Plan?: Yes-Currently Present Has patient had any suicidal plan within the past 6 months prior to admission? : Yes Specify Current Suicidal Plan: pt states he plans to cut himself with a knife Access to Means: Yes Specify Access to Suicidal Means: pt states he has access to knives and other sharps  What has been your use of drugs/alcohol within the last 12 months?: cocaine use  Previous Attempts/Gestures: Yes How many times?: (multiple) Other Self Harm Risks: hx of suicide attempts, depression, ongoing MI, current SI with plan  Triggers for Past Attempts: Unpredictable Intentional Self Injurious Behavior: None Family Suicide History: No Recent stressful life event(s): Other (Comment)(pt states he feels angry, he does not know the reason) Persecutory voices/beliefs?: Yes Depression: Yes Depression Symptoms: Feeling angry/irritable,  Insomnia Substance abuse history and/or treatment for substance abuse?: Yes Suicide prevention information given to non-admitted patients: Not applicable  Risk to Others within the past 6 months Homicidal Ideation: Yes-Currently Present Does patient have any lifetime risk of violence toward others beyond the six months prior to admission? : Yes (comment)(HI ) Thoughts of Harm to Others: Yes-Currently Present Comment - Thoughts of Harm to Others: pt states he wants to hurt someone and he does not care who it is  Current Homicidal Intent: No Current Homicidal Plan: No Access to Homicidal Means: No Identified Victim: "anyone" History of harm to others?: No Assessment of Violence: None Noted Does patient have access to weapons?: Yes (Comment)(knives) Criminal Charges Pending?: No Does patient have a court date: No Is patient on probation?: No  Psychosis Hallucinations: Auditory Delusions: None noted  Mental Status Report Appearance/Hygiene: In scrubs, Unremarkable Eye Contact: Good Motor Activity: Freedom of movement Speech: Logical/coherent Level of Consciousness: Alert Mood: Depressed Affect: Appropriate to circumstance Anxiety Level: None Thought Processes: Relevant, Coherent Judgement: Partial Orientation: Place, Person, Time, Situation, Appropriate for developmental age Obsessive Compulsive Thoughts/Behaviors: None  Cognitive Functioning Concentration: Normal Memory: Remote Intact, Recent Intact Is patient IDD:  No Insight: Fair Impulse Control: Fair Appetite: Fair Have you had any weight changes? : No Change Sleep: Decreased Total Hours of Sleep: 2 Vegetative Symptoms: None  ADLScreening [facility]) Patient's cognitive ability adequate to safely complete daily activities?: Yes Patient able to express need for assistance with ADLs?: Yes Independently performs ADLs?: Yes (appropriate for developmental age)  Prior Inpatient Therapy Prior Inpatient  Therapy: Yes Prior Therapy Dates: 2019 [facility] Reason for Treatment: MDD  Prior Outpatient Therapy Prior Outpatient Therapy: Yes Prior Therapy Dates: CURRENT [facility]  Reason for Treatment: MDD Does patient have an ACCT team?: No Does patient have Intensive In-House Services?  : No Does patient have [facility] services? : No Does patient have P4[facility] services?: No  ADL Screening (condition at time of admission) Patient's cognitive ability adequate to safely complete daily activities?: Yes Is the patient deaf or have difficulty hearing?: No Does the patient have difficulty seeing, even when wearing glasses/contacts?: No Does the patient have difficulty concentrating, remembering, or making decisions?: No Patient able to express need for assistance with ADLs?: Yes Does the patient have difficulty dressing or bathing?: No Independently performs ADLs?: Yes (appropriate for developmental age) Does the patient have difficulty walking or climbing stairs?: No Weakness of Legs: None Weakness of Arms/Hands: None  Home Assistive Devices/Equipment Home Assistive Devices/Equipment: None    Abuse/Neglect Assessment (Assessment to be complete while patient is alone) Abuse/Neglect Assessment Can Be Completed: Yes Physical Abuse: Denies Verbal Abuse: Denies Sexual Abuse: Denies Exploitation of patient/patient's resources: Denies Self-Neglect: Denies     Merchant navy officerAdvance Directives (For Healthcare) Does Patient Have a Medical Advance Directive?: No Would patient like information on creating a medical advance directive?: No - Patient declined          Disposition: Pt is recommended for continued observation to be reassessed in the AM by psych due to hx of SI and current plan to cut his wrists. [facility] and returns to the ED several hours later citing SI and HI. EDP Marcus HongMiller, Brian, MD and pt's nurse  Annia FriendlySusanna, RN have been advised.  Disposition Initial Assessment Completed for this Encounter: Yes Disposition of Patient: (overnight OBS pending AM psych assessment ) Patient refused recommended treatment: No  This service was provided via telemedicine using a 2-way, interactive audio and video technology.  Names of all persons participating in this telemedicine service and their role in this encounter. Name: Marcus BatheGregory L Ghazi Cummings. Role: Patient  Name: Princess Bruinsquicha Rand Boller Role: TTS          Karolee Ohsquicha R Darice Vicario 01/03/2018 12:05 AM

## 2018-01-03 NOTE — ED Notes (Signed)
SW in w/pt. 

## 2018-01-03 NOTE — ED Notes (Signed)
Pt states he is calling someone to come to pick him up from his cell phone.

## 2018-01-03 NOTE — ED Notes (Addendum)
Attempted to reach SW - no answer - unable to leave VM.

## 2018-01-03 NOTE — ED Notes (Signed)
ALL belongings - 3 labeled belongings bags - returned to pt - Pt signed verifying all items present. Resources given to pt by SW for shelters and [facility]. Pt asking for cab voucher - recommended for pt to discuss w/SW when returns.

## 2018-01-03 NOTE — ED Notes (Signed)
Telepsych being performed. 

## 2018-01-03 NOTE — Progress Notes (Signed)
Patient is seen by me via tele-psych and I have consulted with Dr. Jama Flavorsobos.  Patient was just discharged yesterday morning and it was well-documented the patient was denying any suicidal or homicidal ideations and denied any hallucinations.  Patient did like the staff stating that he had someone come and pick him up and he had a place to stay, but then it was discovered that patient was homeless and he was malingering to stay in the hospital another night.  Patient was to the posterior been discharged on 01/01/2018 but ended up staying another night.  Patient's UDS is positive for cocaine however patient adamantly denies using any cocaine.  When patient was confronted about his denying suicidal or homicidal ideations or hallucinations over the past few days he stated that everyone lied and that he told the nurses and the doctors that he was suicidal.  Patient was [facility] and he [facility] it.  Patient then stated that he needs his medications changed first.  Patient is instructed that he should follow-up with his outpatient provider [place] [facility] [place] [facility] [place] at his last discharge.  Patient is also informed that he can go to places such as [facility] or [facility] for follow-up as a walk-in.  Patient also admits to being chronically suicidal and states "I do not remember the last time I did not have suicidal thoughts."  Feel the patient is making statements of suicidal ideation so that he can stay in the hospital for extended length of time due to being homeless.  Patient does not meet inpatient criteria and is psychiatrically cleared.  I discussed this patient with Dr. Jama Flavorsobos and I have contacted Terance HartKelly Gekas, PA notified her of the recommendations.  I have also requested that social work be involved with patient's discharge to assist with transportation [facility].

## 2018-01-03 NOTE — ED Notes (Signed)
Pt awake eating breakfast 

## 2018-01-03 NOTE — ED Notes (Signed)
SW spoke w/pt and recommended for pt to request IRC to assist w/transportation to [facility]. Pt voiced understanding. RN escorted pt to lobby - pt advised his ride will be here to pick him up in less than 15 min - Voiced understanding he may wait for up to 1 hour for ride - Security aware.

## 2018-01-03 NOTE — Progress Notes (Addendum)
12:14PM: CSW informed patient of not being able to assist getting to [place] at this time but discussed IRC supports and verified patient had contact info. CSW is signing off.   12:00PM: Patient is not eligible to receive the petty cash. CSW noted patient is calling someone to pick him up. CSW will continue to follow as needed.   CSW met with patient to discuss needs with homelessness. CSW provided patient with a handout that included multiple local homeless shelters for tonight and reviewed information with the patient. CSW confirmed patient wanted to go to [facility]. CSW provided directions to patient and provided paper copy to RN to place in discharge paperwork. CSW will provide nurse with [place]TA and PART bus pass. CSW is awaiting response from SW Surveyor, quantity regarding $3.00 petty cash to pay for bus ticket from [place] to [place], [place], [place] Worker [place] [phone]

## 2018-01-03 NOTE — ED Notes (Signed)
Ordered diet tray for pt  

## 2018-01-27 ENCOUNTER — Other Ambulatory Visit: Payer: Self-pay

## 2018-01-27 ENCOUNTER — Emergency Department (HOSPITAL_COMMUNITY)
Admission: EM | Admit: 2018-01-27 | Discharge: 2018-01-27 | Disposition: A | Discharge status: Other Acute Inpatient Hospital | Payer: Non-veteran care | Attending: Emergency Medicine | Admitting: Emergency Medicine

## 2018-01-27 ENCOUNTER — Encounter (HOSPITAL_COMMUNITY): Payer: Self-pay

## 2018-01-27 ENCOUNTER — Encounter (HOSPITAL_COMMUNITY): Payer: Self-pay | Admitting: Emergency Medicine

## 2018-01-27 ENCOUNTER — Inpatient Hospital Stay (HOSPITAL_COMMUNITY)
Admission: AD | Admit: 2018-01-27 | Discharge: 2018-02-01 | DRG: 885 | Disposition: A | Discharge status: Home / Self Care | Payer: Federal, State, Local not specified - Other | Source: Intra-hospital | Attending: Psychiatry | Admitting: Psychiatry

## 2018-01-27 ENCOUNTER — Emergency Department (HOSPITAL_COMMUNITY): Payer: Non-veteran care

## 2018-01-27 DIAGNOSIS — R45851 Suicidal ideations: Secondary | ICD-10-CM | POA: Insufficient documentation

## 2018-01-27 DIAGNOSIS — Z8249 Family history of ischemic heart disease and other diseases of the circulatory system: Secondary | ICD-10-CM | POA: Diagnosis not present

## 2018-01-27 DIAGNOSIS — F4310 Post-traumatic stress disorder, unspecified: Secondary | ICD-10-CM | POA: Diagnosis present

## 2018-01-27 DIAGNOSIS — Z888 Allergy status to other drugs, medicaments and biological substances status: Secondary | ICD-10-CM | POA: Diagnosis not present

## 2018-01-27 DIAGNOSIS — F17210 Nicotine dependence, cigarettes, uncomplicated: Secondary | ICD-10-CM | POA: Diagnosis present

## 2018-01-27 DIAGNOSIS — F1420 Cocaine dependence, uncomplicated: Secondary | ICD-10-CM | POA: Insufficient documentation

## 2018-01-27 DIAGNOSIS — Z79899 Other long term (current) drug therapy: Secondary | ICD-10-CM | POA: Diagnosis not present

## 2018-01-27 DIAGNOSIS — Z7984 Long term (current) use of oral hypoglycemic drugs: Secondary | ICD-10-CM

## 2018-01-27 DIAGNOSIS — R45850 Homicidal ideations: Secondary | ICD-10-CM | POA: Diagnosis present

## 2018-01-27 DIAGNOSIS — Z7982 Long term (current) use of aspirin: Secondary | ICD-10-CM | POA: Diagnosis not present

## 2018-01-27 DIAGNOSIS — I10 Essential (primary) hypertension: Secondary | ICD-10-CM | POA: Insufficient documentation

## 2018-01-27 DIAGNOSIS — Z8673 Personal history of transient ischemic attack (TIA), and cerebral infarction without residual deficits: Secondary | ICD-10-CM

## 2018-01-27 DIAGNOSIS — E119 Type 2 diabetes mellitus without complications: Secondary | ICD-10-CM | POA: Diagnosis present

## 2018-01-27 DIAGNOSIS — F332 Major depressive disorder, recurrent severe without psychotic features: Principal | ICD-10-CM | POA: Diagnosis present

## 2018-01-27 DIAGNOSIS — F1210 Cannabis abuse, uncomplicated: Secondary | ICD-10-CM | POA: Diagnosis present

## 2018-01-27 DIAGNOSIS — F1410 Cocaine abuse, uncomplicated: Secondary | ICD-10-CM | POA: Diagnosis present

## 2018-01-27 DIAGNOSIS — Z915 Personal history of self-harm: Secondary | ICD-10-CM

## 2018-01-27 DIAGNOSIS — G4700 Insomnia, unspecified: Secondary | ICD-10-CM | POA: Diagnosis present

## 2018-01-27 DIAGNOSIS — I2510 Atherosclerotic heart disease of native coronary artery without angina pectoris: Secondary | ICD-10-CM | POA: Insufficient documentation

## 2018-01-27 DIAGNOSIS — Z951 Presence of aortocoronary bypass graft: Secondary | ICD-10-CM | POA: Diagnosis not present

## 2018-01-27 DIAGNOSIS — F322 Major depressive disorder, single episode, severe without psychotic features: Secondary | ICD-10-CM | POA: Diagnosis present

## 2018-01-27 DIAGNOSIS — R4589 Other symptoms and signs involving emotional state: Secondary | ICD-10-CM

## 2018-01-27 DIAGNOSIS — F1010 Alcohol abuse, uncomplicated: Secondary | ICD-10-CM | POA: Diagnosis present

## 2018-01-27 DIAGNOSIS — I252 Old myocardial infarction: Secondary | ICD-10-CM

## 2018-01-27 DIAGNOSIS — R0789 Other chest pain: Secondary | ICD-10-CM | POA: Insufficient documentation

## 2018-01-27 DIAGNOSIS — R4689 Other symptoms and signs involving appearance and behavior: Secondary | ICD-10-CM

## 2018-01-27 DIAGNOSIS — Z590 Homelessness: Secondary | ICD-10-CM | POA: Diagnosis not present

## 2018-01-27 DIAGNOSIS — R079 Chest pain, unspecified: Secondary | ICD-10-CM

## 2018-01-27 LAB — I-STAT CHEM 8, ED
BUN: 9 mg/dL (ref 6–20)
Calcium, Ion: 1.13 mmol/L — ABNORMAL LOW (ref 1.15–1.40)
Chloride: 104 mmol/L (ref 98–111)
Creatinine, Ser: 0.9 mg/dL (ref 0.61–1.24)
Glucose, Bld: 324 mg/dL — ABNORMAL HIGH (ref 70–99)
HCT: 37 % — ABNORMAL LOW (ref 39.0–52.0)
Hemoglobin: 12.6 g/dL — ABNORMAL LOW (ref 13.0–17.0)
Potassium: 3.9 mmol/L (ref 3.5–5.1)
Sodium: 134 mmol/L — ABNORMAL LOW (ref 135–145)
TCO2: 23 mmol/L (ref 22–32)

## 2018-01-27 LAB — COMPREHENSIVE METABOLIC PANEL
ALT: 25 U/L (ref 0–44)
AST: 26 U/L (ref 15–41)
Albumin: 3.6 g/dL (ref 3.5–5.0)
Alkaline Phosphatase: 80 U/L (ref 38–126)
Anion gap: 13 (ref 5–15)
BUN: 12 mg/dL (ref 6–20)
CO2: 18 mmol/L — ABNORMAL LOW (ref 22–32)
Calcium: 8.8 mg/dL — ABNORMAL LOW (ref 8.9–10.3)
Chloride: 100 mmol/L (ref 98–111)
Creatinine, Ser: 1.07 mg/dL (ref 0.61–1.24)
GFR calc Af Amer: >60 mL/min (ref >60)
GFR calc non Af Amer: >60 mL/min (ref >60)
Glucose, Bld: 312 mg/dL — ABNORMAL HIGH (ref 70–99)
POTASSIUM: 3.8 mmol/L (ref 3.5–5.1)
Sodium: 131 mmol/L — ABNORMAL LOW (ref 135–145)
Total Bilirubin: 0.5 mg/dL (ref 0.3–1.2)
Total Protein: 6.5 g/dL (ref 6.5–8.1)

## 2018-01-27 LAB — URINALYSIS, ROUTINE W REFLEX MICROSCOPIC
Bacteria, UA: NONE SEEN
Bilirubin Urine: NEGATIVE
Glucose, UA: >=500 mg/dL — AB
Ketones, ur: 5 mg/dL — AB
LEUKOCYTES UA: NEGATIVE
NITRITE: NEGATIVE
Protein, ur: NEGATIVE mg/dL
Specific Gravity, Urine: 1.002 — ABNORMAL LOW (ref 1.005–1.030)
pH: 6 (ref 5.0–8.0)

## 2018-01-27 LAB — RAPID URINE DRUG SCREEN, HOSP PERFORMED
Amphetamines: NOT DETECTED
Barbiturates: NOT DETECTED
Benzodiazepines: NOT DETECTED
Cocaine: POSITIVE — AB
Opiates: NOT DETECTED
Tetrahydrocannabinol: NOT DETECTED

## 2018-01-27 LAB — ETHANOL: Alcohol, Ethyl (B): <10 mg/dL (ref <10)

## 2018-01-27 LAB — ACETAMINOPHEN LEVEL: Acetaminophen (Tylenol), Serum: <10 ug/mL — ABNORMAL LOW (ref 10–30)

## 2018-01-27 LAB — TROPONIN I: Troponin I: <0.03 ng/mL (ref <0.03)

## 2018-01-27 LAB — SALICYLATE LEVEL

## 2018-01-27 MED ORDER — ASPIRIN 81 MG PO CHEW
324.0000 mg | CHEWABLE_TABLET | Freq: Once | ORAL | Status: AC
  Administered 2018-01-27: 324 mg via ORAL
  Filled 2018-01-27: qty 4

## 2018-01-27 MED ORDER — SODIUM CHLORIDE 0.9 % IV BOLUS
1000.0000 mL | Freq: Once | INTRAVENOUS | Status: AC
  Administered 2018-01-27: 1000 mL via INTRAVENOUS

## 2018-01-27 MED ORDER — DICLOFENAC SODIUM 1 % TD GEL
2.0000 g | Freq: Two times a day (BID) | TRANSDERMAL | Status: DC | PRN
  Administered 2018-01-29: 2 g via TOPICAL
  Filled 2018-01-27: qty 100

## 2018-01-27 MED ORDER — SERTRALINE HCL 100 MG PO TABS
100.0000 mg | ORAL_TABLET | Freq: Every day | ORAL | Status: DC
  Administered 2018-01-28 – 2018-02-01 (×5): 100 mg via ORAL
  Filled 2018-01-27 (×6): qty 1
  Filled 2018-01-27: qty 14
  Filled 2018-01-27: qty 1

## 2018-01-27 MED ORDER — METFORMIN HCL 500 MG PO TABS
1000.0000 mg | ORAL_TABLET | Freq: Two times a day (BID) | ORAL | Status: DC
  Administered 2018-01-28 – 2018-02-01 (×9): 1000 mg via ORAL
  Filled 2018-01-27: qty 2
  Filled 2018-01-27: qty 56
  Filled 2018-01-27 (×7): qty 2
  Filled 2018-01-27: qty 56
  Filled 2018-01-27 (×3): qty 2

## 2018-01-27 MED ORDER — METOPROLOL TARTRATE 50 MG PO TABS
50.0000 mg | ORAL_TABLET | Freq: Two times a day (BID) | ORAL | Status: DC
  Administered 2018-01-28 – 2018-01-29 (×3): 50 mg via ORAL
  Filled 2018-01-27 (×6): qty 1

## 2018-01-27 MED ORDER — POTASSIUM CHLORIDE CRYS ER 20 MEQ PO TBCR
20.0000 meq | EXTENDED_RELEASE_TABLET | Freq: Every day | ORAL | Status: DC
  Administered 2018-01-28 – 2018-02-01 (×5): 20 meq via ORAL
  Filled 2018-01-27: qty 1
  Filled 2018-01-27: qty 14
  Filled 2018-01-27 (×6): qty 1

## 2018-01-27 MED ORDER — FUROSEMIDE 40 MG PO TABS
40.0000 mg | ORAL_TABLET | Freq: Every day | ORAL | Status: DC
  Administered 2018-01-28 – 2018-02-01 (×5): 40 mg via ORAL
  Filled 2018-01-27 (×4): qty 1
  Filled 2018-01-27: qty 14
  Filled 2018-01-27 (×3): qty 1

## 2018-01-27 MED ORDER — TRAZODONE HCL 150 MG PO TABS
150.0000 mg | ORAL_TABLET | Freq: Every day | ORAL | Status: DC
  Administered 2018-01-28 – 2018-01-31 (×4): 150 mg via ORAL
  Filled 2018-01-27: qty 14
  Filled 2018-01-27 (×6): qty 1

## 2018-01-27 MED ORDER — LISINOPRIL 5 MG PO TABS
5.0000 mg | ORAL_TABLET | Freq: Every day | ORAL | Status: DC
  Administered 2018-01-28 – 2018-02-01 (×5): 5 mg via ORAL
  Filled 2018-01-27: qty 14
  Filled 2018-01-27 (×7): qty 1

## 2018-01-27 MED ORDER — NITROGLYCERIN 0.4 MG SL SUBL
0.4000 mg | SUBLINGUAL_TABLET | SUBLINGUAL | Status: DC | PRN
  Filled 2018-01-27: qty 1

## 2018-01-27 MED ORDER — NITROGLYCERIN 0.4 MG SL SUBL: 0.4000 mg | SUBLINGUAL_TABLET | SUBLINGUAL | Status: DC | PRN

## 2018-01-27 NOTE — ED Notes (Signed)
Tele assessment completed at bedside

## 2018-01-27 NOTE — Progress Notes (Signed)
Pt accepted to [facility], Bed 302-2   Shuvon Rankin, NP is the accepting provider.  Dr. Landry Mellow, MD is the attending provider.  Call report to [phone]  @ [facility] Psych ED notified.   Pt is Voluntary.  Pt may be transported by [facility]  Pt scheduled  to arrive at [facility] @4 :30 PM  Carney Bern T. Kaylyn Lim, MSW, LCSWA Disposition Clinical Social Work [phone] (cell) [phone] (office)

## 2018-01-27 NOTE — Tx Team (Signed)
Initial Treatment Plan 01/27/2018 7:03 PM Marcus Cummings. [id number]    PATIENT STRESSORS: Health problems Medication change or noncompliance Substance abuse   PATIENT STRENGTHS: Active sense of humor Average or above average intelligence Motivation for treatment/growth   PATIENT IDENTIFIED PROBLEMS: "get clean"  Substance Abuse  Suicide Risk                 DISCHARGE CRITERIA:  Ability to meet basic life and health needs Adequate post-discharge living arrangements Improved stabilization in mood, thinking, and/or behavior Medical problems require only outpatient monitoring Motivation to continue treatment in a less acute level of care Need for constant or close observation no longer present Reduction of life-threatening or endangering symptoms to within safe limits Safe-care adequate arrangements made Verbal commitment to aftercare and medication compliance Withdrawal symptoms are absent or subacute and managed without 24-hour nursing intervention  PRELIMINARY DISCHARGE PLAN: Outpatient therapy  PATIENT/FAMILY INVOLVEMENT: This treatment plan has been presented to and reviewed with the patient, Marcus Cummings..  The patient and family have been given the opportunity to ask questions and make suggestions.  Ferrel LoganAmanda A Aveya Beal, RN 01/27/2018, 7:03 PM

## 2018-01-27 NOTE — ED Triage Notes (Signed)
Pt homeless with c/o central cp while walking this am. States pain usually resolves after he walks, but today pain remains at 8/10. Denies n/v or sob. Given 324 ASA and 1 NTG PTA. Also states he has SI, plan would be to slit wrists, endorses AH

## 2018-01-27 NOTE — ED Notes (Signed)
Pt transported to [facility] via [facility].

## 2018-01-27 NOTE — Progress Notes (Signed)
Patient ID: Marcus TWING Sr., male   DOB: March 15, 1965, 53 y.o.   MRN: [id number]  .Admission Note  D) Patient admitted to the adult unit 300 hall. Patient is a 53 year old male from [facility]. Patient initially presented with complaints of chest pain. Patient reported SI with plan to cut wrists. Patient currently endorses SI but denies plan while at [facility]. Patient reports HI to no one specific, "just people that get me mad". Patient endorses auditory hallucinations and states, "I have heard voices for 15 years". Patient discharged from [facility] one month ago and relapsed on alcohol and cocaine. Patient denies legal issues and is seeking detox.   Skin assessment was completed and unremarkable except for a healed surgical scar to his sternum. Patient belongings searched with no contraband found. Belongings in locker. Vital signs obtained. Snacks and fluids offered.    A) Plan of care, unit policies and patient expectations were explained. Written consents obtained. Patient oriented to the unit and their room. Patient placed on standard q15 safety checks. High fall risk precautions initiated and reviewed with patient.   R) Patient is in no acute distress and verbalizes understanding of information provided. Patient with no concerns at this time. Patient contracts for safety with staff on the unit. Report given to receiving RN.

## 2018-01-27 NOTE — BH Assessment (Addendum)
Tele Assessment Note   Patient Name: Marcus Cummings Lussier Sr. MRN: [id number] Referring Physician:  [facility] Location of Provider: Behavioral Health TTS Department  Marcus Cummings Awbrey Sr. is an 53 y.o. male.  The pt came in due to medical reasons and then stated he is suicidal with a plan to kill himself by cutting his wrist.  He has a history of trying to kill himself by overdosing on pills 5 years ago. The pt also stated he is having thoughts of killing others, but no one in particular.  The pt stated he has a plan to cut peoples throats.  [facility]  [facility] early December for SI.  The pt is homeless.  He denies self harm, legal issues, history of abuse and hallucinations.  He stated he isn't sleeping or eating well.  The pt uses about 2 grams a week of cocaine and last used yesterday.  The pt reported feeling hopeless.  Diagnosis: F33.2 Major depressive disorder, Recurrent episode, Severe  F14.20 Cocaine use disorder, Severe  Past Medical History:  Past Medical History:  Diagnosis Date  . Depression   . Diabetes mellitus without complication (HCC)   . Hypertension   . MI, acute, non ST segment elevation (HCC) 2007   stents x 2 2007 [facility]  . Stroke (HCC) 2017  . Suicidal ideation     Past Surgical History:  Procedure Laterality Date  . CORONARY ARTERY BYPASS GRAFT N/A 06/23/2017   Procedure: CORONARY ARTERY BYPASS GRAFTING (CABG) x 3; Using Left Internal Mammary Artery and Right Leg Greater Saphenous Vein harvested endoscopically.;  Surgeon: Kerin PernaVan Trigt, Peter, MD;  [facility];  Service: Open Heart Surgery;  Laterality: N/A;  . LEFT HEART CATH AND CORONARY ANGIOGRAPHY N/A 06/18/2017   Procedure: LEFT HEART CATH AND CORONARY ANGIOGRAPHY;  Surgeon: Lennette BihariKelly, Thomas A, MD;  [facility]: [facility] INVASIVE CV LAB;  Service: Cardiovascular;  Laterality: N/A;  . TEE WITHOUT CARDIOVERSION N/A 06/23/2017    Procedure: TRANSESOPHAGEAL ECHOCARDIOGRAM (TEE);  Surgeon: Donata ClayVan Trigt, Theron AristaPeter, MD;  [facility];  Service: Open Heart Surgery;  Laterality: N/A;    Family History:  Family History  Problem Relation Age of Onset  . CAD Mother   . Congestive Heart Failure Father     Social History:  reports that he has been smoking cigarettes. He has been smoking about 1.00 pack per day. He has never used smokeless tobacco. He reports current alcohol use. He reports current drug use. Drugs: Marijuana and Cocaine.  Additional Social History:  Alcohol / Drug Use Pain Medications: See MAR Prescriptions: See MAR Over the Counter: See MAR History of alcohol / drug use?: Yes Longest period of sobriety (when/how long): unknown Substance #1 Name of Substance 1: cocaine 1 - Age of First Use: 23 1 - Amount (size/oz): 2 grams 1 - Frequency: once a month 1 - Duration: NA 1 - Last Use / Amount: 01/26/2018  CIWA: CIWA-Ar BP: 128/76 Pulse Rate: 71 COWS:    Allergies:  Allergies  Allergen Reactions  . Zyprexa [Olanzapine] Itching and Other (See Comments)    Home Medications: (Not in a hospital admission)   OB/GYN Status:  No LMP for male patient.  General Assessment Data [facility] T[facility] Assessment: In system Is this a Tele or Face-to-Face Assessment?: Face-to-Face Is this an Initial Assessment or a Re-assessment for this encounter?: Initial Assessment Patient  Accompanied by:: N/A Language Other than English: No Living Arrangements: Homeless/Shelter What gender do you identify as?: Male Marital status: Divorced JordanMaiden name: NA Pregnancy Status: No Living Arrangements: Other (Comment)(homeless) Can pt return to current living arrangement?: Yes Admission Status: Voluntary Is patient capable of signing voluntary admission?: Yes Referral Source: Self/Family/Friend Insurance type: [place]     Crisis Care Plan Living Arrangements: Other (Comment)(homeless) Legal Guardian:  Other:(Self) Name of Psychiatrist: [facility] Name of Therapist: [facility]  Education Status Is patient currently in school?: No Is the patient employed, unemployed or receiving disability?: Receiving disability income  Risk to self with the past 6 months Suicidal Ideation: Yes-Currently Present Has patient been a risk to self within the past 6 months prior to admission? : No Suicidal Intent: Yes-Currently Present Has patient had any suicidal intent within the past 6 months prior to admission? : Yes Is patient at risk for suicide?: Yes Suicidal Plan?: Yes-Currently Present Has patient had any suicidal plan within the past 6 months prior to admission? : Yes Specify Current Suicidal Plan: cut self Access to Means: Yes Specify Access to Suicidal Means: can get a knife What has been your use of drugs/alcohol within the last 12 months?: cocaine use Previous Attempts/Gestures: Yes How many times?: 1 Other Self Harm Risks: suicide attempt 5-6 years ago by overdosing Triggers for Past Attempts: Unpredictable Intentional Self Injurious Behavior: None Family Suicide History: No Recent stressful life event(s): Other (Comment)(homeless) Persecutory voices/beliefs?: No Depression: Yes Depression Symptoms: Loss of interest in usual pleasures, Feeling worthless/self pity Substance abuse history and/or treatment for substance abuse?: Yes Suicide prevention information given to non-admitted patients: Not applicable  Risk to Others within the past 6 months Homicidal Ideation: Yes-Currently Present Does patient have any lifetime risk of violence toward others beyond the six months prior to admission? : Yes (comment) Thoughts of Harm to Others: Yes-Currently Present Comment - Thoughts of Harm to Others: to cut someones throat Current Homicidal Intent: Yes-Currently Present Current Homicidal Plan: Yes-Currently Present Describe Current Homicidal Plan: cut someone's throat Access to Homicidal Means:  Yes Describe Access to Homicidal Means: can get a knife Identified Victim: no specific person History of harm to others?: No Assessment of Violence: None Noted Violent Behavior Description: none Does patient have access to weapons?: No Criminal Charges Pending?: No Does patient have a court date: No Is patient on probation?: No  Psychosis Hallucinations: None noted Delusions: None noted  Mental Status Report Appearance/Hygiene: In scrubs, Unremarkable Eye Contact: Fair Motor Activity: Freedom of movement, Unremarkable Speech: Logical/coherent Level of Consciousness: Alert Mood: Depressed Affect: Appropriate to circumstance Anxiety Level: None Thought Processes: Coherent, Relevant Judgement: Partial Orientation: Place, Person, Time, Situation, Appropriate for developmental age Obsessive Compulsive Thoughts/Behaviors: None  Cognitive Functioning Concentration: Normal Memory: Recent Intact, Remote Intact Is patient IDD: No Insight: Poor Impulse Control: Poor Appetite: Poor Have you had any weight changes? : No Change Sleep: Decreased Total Hours of Sleep: 2 Vegetative Symptoms: None  ADLScreening [facility] Assessment Services) Patient's cognitive ability adequate to safely complete daily activities?: Yes Patient able to express need for assistance with ADLs?: Yes Independently performs ADLs?: Yes (appropriate for developmental age)  Prior Inpatient Therapy Prior Inpatient Therapy: Yes Prior Therapy Dates: 2019 [facility] Reason for Treatment: MDD  Prior Outpatient Therapy Prior Outpatient Therapy: Yes Prior Therapy Dates: CURRENT [facility]  Reason for Treatment: MDD Does patient have an ACCT team?: No Does patient have Intensive In-House Services?  : No Does patient have  [facility] services? : No Does patient have P4CC services?: No  ADL Screening (condition at time of admission) Patient's cognitive  ability adequate to safely complete daily activities?: Yes Patient able to express need for assistance with ADLs?: Yes Independently performs ADLs?: Yes (appropriate for developmental age)       Abuse/Neglect Assessment (Assessment to be complete while patient is alone) Abuse/Neglect Assessment Can Be Completed: Yes Physical Abuse: Denies Verbal Abuse: Denies Sexual Abuse: Denies Exploitation of patient/patient's resources: Denies Self-Neglect: Denies Values / Beliefs Cultural Requests During Hospitalization: None Spiritual Requests During Hospitalization: None Consults Spiritual Care Consult Needed: No Social Work Consult Needed: No Merchant navy officer (For Healthcare) Does Patient Have a Medical Advance Directive?: No Would patient like information on creating a medical advance directive?: No - Patient declined          Disposition:  Disposition Initial Assessment Completed for this Encounter: Yes  NP Kayren Eaves recommends inpatient treatment.  RN Alphonzo Lemmings was made aware of the recommendations.  This service was provided via telemedicine using a 2-way, interactive audio and video technology.  Names of all persons participating in this telemedicine service and their role in this encounter. Name: Shuayb Birschbach Role: Pt  Name: Riley Churches Role: TTS  Name:  Role:   Name:  Role:     Ottis Stain 01/27/2018 3:52 PM

## 2018-01-27 NOTE — Progress Notes (Signed)
D: Pt passive SI/ AH- contracts for safety. Pt is pleasant and cooperative. Pt very lethargic this evening, pt awakes with voice. Pt night medications held due to pt already sedated. Pt  Bp 139/ 80 P-59  A: Pt was offered support and encouragement. Pt was given scheduled medications. Pt was encourage to attend groups. Q 15 minute checks were done for safety.   R: safety maintained on unit.  Problem: Activity: Goal: Sleeping patterns will improve Outcome: Progressing   Problem: Coping: Goal: Ability to demonstrate self-control will improve Outcome: Progressing

## 2018-01-27 NOTE — ED Provider Notes (Signed)
Emergency Department Provider Note   I have reviewed the triage vital signs and the nursing notes.   HISTORY  Chief Complaint Chest Pain   HPI Marcus COLELLO Sr. is a 53 y.o. male with a past medical history of coronary artery disease, stroke, diabetes, hypertension, depression and suicidal thoughts with a history of schizophrenia and polysubstance abuse presents the emergency department today with chest discomfort.  Patient states it feels like a pressure in his chest.  He went for walk to see if that would make it better and it did not so he called EMS.  EMS gave him nitroglycerin and aspirin which resolved symptoms.  Has a history of coronary disease and this does not feel like his previous heart attack.  No associated shortness of breath, nausea, vomiting, diaphoresis, lightheadedness or near syncope.  Also endorses suicidal thoughts with a plan to slice his wrists. No other associated or modifying symptoms.    Past Medical History:  Diagnosis Date  . Depression   . Diabetes mellitus without complication (HCC)   . Hypertension   . MI, acute, non ST segment elevation (HCC) 2007   stents x 2 2007 [facility])  . Stroke (HCC) 2017  . Suicidal ideation     Patient Active Problem List   Diagnosis Date Noted  . MDD (major depressive disorder), severe (HCC) 01/27/2018  . Severe recurrent major depression without psychotic features (HCC) 12/29/2017  . Cocaine abuse (HCC) 10/10/2017  . Schizoaffective disorder (HCC) 09/30/2017  . 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 Essential hypertension   . Dyslipidemia   . Chest pain on exertion   . Stable angina (HCC) 06/17/2017    Past Surgical History:  Procedure Laterality Date  . CORONARY ARTERY BYPASS GRAFT N/A 06/23/2017   Procedure: CORONARY ARTERY BYPASS GRAFTING (CABG) x 3; Using Left Internal Mammary Artery and Right Leg Greater Saphenous Vein harvested endoscopically.;  Surgeon: Kerin Perna, MD;  Location: [facility] OR;  Service: Open  Heart Surgery;  Laterality: N/A;  . LEFT HEART CATH AND CORONARY ANGIOGRAPHY N/A 06/18/2017   Procedure: LEFT HEART CATH AND CORONARY ANGIOGRAPHY;  Surgeon: Lennette Bihari, MD;  Location: [facility] INVASIVE CV LAB;  Service: Cardiovascular;  Laterality: N/A;  . TEE WITHOUT CARDIOVERSION N/A 06/23/2017   Procedure: TRANSESOPHAGEAL ECHOCARDIOGRAM (TEE);  Surgeon: Donata Clay, Theron Arista, MD;  Location[facility] OR;  Service: Open Heart Surgery;  Laterality: N/A;      Allergies Zyprexa [olanzapine]  Family History  Problem Relation Age of Onset  . CAD Mother   . Congestive Heart Failure Father     Social History Social History   Tobacco Use  . Smoking status: Current Every Day Smoker    Packs/day: 1.00    Types: Cigarettes  . Smokeless tobacco: Never Used  . Tobacco comment: Declined information  Substance Use Topics  . Alcohol use: Yes    Comment: 1-2  beers a week  . Drug use: Yes    Types: Marijuana, Cocaine    Review of Systems  All other systems negative except as documented in the HPI. All pertinent positives and negatives as reviewed in the HPI. ____________________________________________   PHYSICAL EXAM:  VITAL SIGNS: ED Triage Vitals  Enc Vitals Group     BP 01/27/18 0800 112/76     Pulse Rate 01/27/18 0800 90     Resp --      Temp 01/27/18 0806 98.1 F (36.7 C)     Temp Source 01/27/18 0806 Oral  SpO2 01/27/18 0756 97 %     Weight 01/27/18 0802 204 lb 12.9 oz (92.9 kg)    Constitutional: Alert and oriented. Well appearing and in no acute distress. Eyes: Conjunctivae are normal. PERRL. EOMI. Head: Atraumatic. Nose: No congestion/rhinnorhea. Mouth/Throat: Mucous membranes are moist.  Oropharynx non-erythematous. Neck: No stridor.  No meningeal signs.   Cardiovascular: Normal rate, regular rhythm. Good peripheral circulation. Grossly normal heart sounds.   Respiratory: Normal respiratory effort.  No retractions. Lungs CTAB. Gastrointestinal: Soft and nontender. No  distention.  Musculoskeletal: No lower extremity tenderness nor edema. No gross deformities of extremities. Neurologic:  Normal speech and language. No gross focal neurologic deficits are appreciated.  Skin:  Skin is warm, dry and intact. No rash noted.   ____________________________________________   LABS (all labs ordered are listed, but only abnormal results are displayed)  Labs Reviewed  COMPREHENSIVE METABOLIC PANEL - Abnormal; Notable for the following components:      Result Value   Sodium 131 (*)    CO2 18 (*)    Glucose, Bld 312 (*)    Calcium 8.8 (*)    All other components within normal limits  ACETAMINOPHEN LEVEL - Abnormal; Notable for the following components:   Acetaminophen (Tylenol), Serum <10 (*)    All other components within normal limits  RAPID URINE DRUG SCREEN, HOSP PERFORMED - Abnormal; Notable for the following components:   Cocaine POSITIVE (*)    All other components within normal limits  URINALYSIS, ROUTINE W REFLEX MICROSCOPIC - Abnormal; Notable for the following components:   Color, Urine STRAW (*)    Specific Gravity, Urine 1.002 (*)    Glucose, UA >=500 (*)    Hgb urine dipstick SMALL (*)    Ketones, ur 5 (*)    All other components within normal limits  I-STAT CHEM 8, ED - Abnormal; Notable for the following components:   Sodium 134 (*)    Glucose, Bld 324 (*)    Calcium, Ion 1.13 (*)    Hemoglobin 12.6 (*)    HCT 37.0 (*)    All other components within normal limits  TROPONIN I  SALICYLATE LEVEL  ETHANOL  CBC WITH DIFFERENTIAL/PLATELET   ____________________________________________  EKG   EKG Interpretation  Date/Time:  Wednesday January 27 2018 08:04:16 EST Ventricular Rate:  77 PR Interval:    QRS Duration: 100 QT Interval:  381 QTC Calculation: 432 R Axis:   29 Text Interpretation:  Sinus rhythm No significant change since last tracing Confirmed by Marily MemosMesner, Gibran Veselka [phone]([id number]) on 01/27/2018 8:33:54 AM        ____________________________________________  RADIOLOGY  No results found.  ____________________________________________   PROCEDURES  Procedure(s) performed:   Procedures   ____________________________________________   INITIAL IMPRESSION / ASSESSMENT AND PLAN / ED COURSE  CP atypical but high risk 2/2 history. Will delta troponin. When medically cleared, consult TTS.   Medically clear.  Stable for CTS consultation and further evaluation and management by psychiatry.     Pertinent labs & imaging results that were available during my care of the patient were reviewed by me and considered in my medical decision making (see chart for details).  ____________________________________________  FINAL CLINICAL IMPRESSION(S) / ED DIAGNOSES  Final diagnoses:  None     MEDICATIONS GIVEN DURING THIS VISIT:  Medications  aspirin chewable tablet 324 mg (324 mg Oral Given 01/27/18 1021)  sodium chloride 0.9 % bolus 1,000 mL (0 mLs Intravenous Stopped 01/27/18 1326)     NEW OUTPATIENT MEDICATIONS STARTED  DURING THIS VISIT:  Discharge Medication List as of 01/27/2018  4:54 PM      Note:  This note was prepared with assistance of Dragon voice recognition software. Occasional wrong-word or sound-a-like substitutions may have occurred due to the inherent limitations of voice recognition software.   Marily Memos, MD 01/28/18 [phone]

## 2018-01-27 NOTE — ED Notes (Signed)
Knife found in pts pocket, placed in red lab bag with label and given to security.

## 2018-01-27 NOTE — ED Notes (Signed)
Called [facility] transport to [facility]

## 2018-01-28 DIAGNOSIS — F332 Major depressive disorder, recurrent severe without psychotic features: Principal | ICD-10-CM

## 2018-01-28 DIAGNOSIS — R45851 Suicidal ideations: Secondary | ICD-10-CM

## 2018-01-28 LAB — GLUCOSE, CAPILLARY
Glucose-Capillary: 313 mg/dL — ABNORMAL HIGH (ref 70–99)
Glucose-Capillary: 199 mg/dL — ABNORMAL HIGH (ref 70–99)
Glucose-Capillary: 213 mg/dL — ABNORMAL HIGH (ref 70–99)

## 2018-01-28 MED ORDER — INSULIN ASPART 100 UNIT/ML ~~LOC~~ SOLN
0.0000 [IU] | Freq: Every day | SUBCUTANEOUS | Status: DC
  Administered 2018-01-28 – 2018-01-29 (×2): 2 [IU] via SUBCUTANEOUS
  Administered 2018-01-31: 3 [IU] via SUBCUTANEOUS

## 2018-01-28 MED ORDER — INSULIN ASPART 100 UNIT/ML ~~LOC~~ SOLN
0.0000 [IU] | Freq: Three times a day (TID) | SUBCUTANEOUS | Status: DC
  Administered 2018-01-28: 11 [IU] via SUBCUTANEOUS
  Administered 2018-01-28: 3 [IU] via SUBCUTANEOUS
  Administered 2018-01-29 (×2): 5 [IU] via SUBCUTANEOUS
  Administered 2018-01-29: 2 [IU] via SUBCUTANEOUS
  Administered 2018-01-30: 5 [IU] via SUBCUTANEOUS
  Administered 2018-01-30: 8 [IU] via SUBCUTANEOUS
  Administered 2018-01-30: 3 [IU] via SUBCUTANEOUS
  Administered 2018-01-31 (×2): 5 [IU] via SUBCUTANEOUS
  Administered 2018-01-31 – 2018-02-01 (×2): 3 [IU] via SUBCUTANEOUS

## 2018-01-28 NOTE — [facility] Group Notes (Signed)
LCSW Group Therapy Note  01/28/2018 1:15pm  Type of Therapy and Topic:  Group Therapy: Avoiding Self-Sabotaging and Enabling Behaviors  Participation Level:  Did Not Attend--pt chose to remain in bed.   Description of Group:   In this group, patients will learn how to identify obstacles, self-sabotaging and enabling behaviors, as well as: what are they, why do we do them and what needs these behaviors meet. Discuss unhealthy relationships and how to have positive healthy boundaries with those that sabotage and enable. Explore aspects of self-sabotage and enabling in yourself and how to limit these self-destructive behaviors in everyday life.   Therapeutic Goals: 1. Patient will identify one obstacle that relates to self-sabotage and enabling behaviors 2. Patient will identify one personal self-sabotaging or enabling behavior they did prior to admission 3. Patient will state a plan to change the above identified behavior 4. Patient will demonstrate ability to communicate their needs through discussion and/or role play.   Summary of Patient Progress:   x  Therapeutic Modalities:   Cognitive Behavioral Therapy Person-Centered Therapy Motivational Interviewing   Rona Ravens, LCSW 01/28/2018 1:51 PM

## 2018-01-28 NOTE — [facility] Suicide Risk Assessment (Signed)
[facility] INPATIENT:  Family/Significant Other Suicide Prevention Education  Suicide Prevention Education:  Patient Refusal for Family/Significant Other Suicide Prevention Education: The patient Marcus BatheGregory L Morioka Sr. has refused to provide written consent for family/significant other to be provided Family/Significant Other Suicide Prevention Education during admission and/or prior to discharge.  Physician notified.  SPE completed with pt, as pt refused to consent to family contact. SPI pamphlet provided to pt and pt was encouraged to share information with support network, ask questions, and talk about any concerns relating to SPE. Pt denies access to guns/firearms and verbalized understanding of information provided. Mobile Crisis information also provided to pt.   Marcus RavensHeather S Nydia Ytuarte LCSW 01/28/2018, 10:27 AM

## 2018-01-28 NOTE — H&P (Signed)
Psychiatric Admission Assessment Adult  Patient Identification: Marcus BatheGregory L Notte Sr. MRN:  [id number] Date of Evaluation:  01/28/2018 Chief Complaint:  Mdd Polysubstance abuse Alcohol abuse Principal Diagnosis: <principal problem not specified> Diagnosis:  Active Problems:   MDD (major depressive disorder), severe (HCC)  History of Present Illness: Patient is seen and examined.  Patient is a 53 year old male with a past psychiatric history significant for bipolar disorder, PTSD, substance abuse involving alcohol, cocaine and cannabis.  The patient presented to the [facility] emergency department with suicidal ideation.  He stated he came in due to medical reasons, and then stated he was suicidal with a plan to kill himself by cutting his wrist.  He apparently overdosed on pills 5 years ago.  He has admitted to some homicidal ideation but is nonspecific.  He is being followed by [facility] for his mental health treatment.  He has been admitted to our facility on several occasions.  His last admission here was on 12/29/2017.  He was discharged on 01/02/2018.  He stated that he has been homeless for the last month.  He stated that the place he was staying at evicted him because of his substance abuse.  He is requesting to go to the [facility].  His drug screen was positive for cocaine, but his blood alcohol was less than 10.  His liver function enzymes were normal.  His MCV is normal.  He also has a past medical history significant for hypertension, diabetes mellitus, status post myocardial infarction with stent placement and status post CVA.  He was admitted to the hospital for evaluation and stabilization.  Associated Signs/Symptoms: Depression Symptoms:  depressed mood, anhedonia, insomnia, psychomotor retardation, fatigue, feelings of worthlessness/guilt, difficulty concentrating, hopelessness, suicidal thoughts without plan, anxiety, loss  of energy/fatigue, disturbed sleep, weight loss, (Hypo) Manic Symptoms:  Impulsivity, Irritable Mood, Anxiety Symptoms:  Excessive Worry, Psychotic Symptoms:  Denied PTSD Symptoms: Negative Total Time spent with patient: 45 minutes  Past Psychiatric History: Patient has had multiple hospitalizations in his lifetime.  His last hospitalization in our facility was on 12/29/2017.  He again presented to the emergency department at [facility] on 01/02/2018 with suicidal ideation.  This was shortly after he had been discharged from this facility.  He went there because he was having chest pain, was evaluated and released.  Prior to that his last hospitalization here was on 09/30/2017 with essentially the same complaints as above.  His discharge psychiatric medications from his last hospitalization here included naltrexone 50 mg p.o. daily, sertraline 100 mg p.o. daily and trazodone 150 mg p.o. daily.  Is the patient at risk to self? Yes.    Has the patient been a risk to self in the past 6 months? Yes.    Has the patient been a risk to self within the distant past? Yes.    Is the patient a risk to others? No.  Has the patient been a risk to others in the past 6 months? No.  Has the patient been a risk to others within the distant past? No.   Prior Inpatient Therapy:   Prior Outpatient Therapy:    Alcohol Screening: 1. How often do you have a drink containing alcohol?: 2 to 3 times a week 2. How many drinks containing alcohol do you have on a typical day when you are drinking?: 5 or 6 3. How often do you have six or more drinks on one occasion?: Monthly AUDIT-C Score: 7 4.  How often during the last year have you found that you were not able to stop drinking once you had started?: Less than monthly 5. How often during the last year have you failed to do what was normally expected from you becasue of drinking?: Monthly 6. How often during the last year have you needed a first drink in the morning to  get yourself going after a heavy drinking session?: Daily or almost daily 7. How often during the last year have you had a feeling of guilt of remorse after drinking?: Weekly 8. How often during the last year have you been unable to remember what happened the night before because you had been drinking?: Daily or almost daily 9. Have you or someone else been injured as a result of your drinking?: No 10. Has a relative or friend or a doctor or another health worker been concerned about your drinking or suggested you cut down?: Yes, during the last year Alcohol Use Disorder Identification Test Final Score (AUDIT): 25 Intervention/Follow-up: Alcohol Education, Brief Advice, Continued Monitoring, Medication Offered/Prescribed Substance Abuse History in the last 12 months:  Yes.   Consequences of Substance Abuse: Withdrawal Symptoms:   Headaches Nausea Tremors Previous Psychotropic Medications: Yes  Psychological Evaluations: Yes  Past Medical History:  Past Medical History:  Diagnosis Date  . Depression   . Diabetes mellitus without complication (HCC)   . Hypertension   . MI, acute, non ST segment elevation (HCC) 2007   stents x 2 2007 [facility])  . Stroke (HCC) 2017  . Suicidal ideation     Past Surgical History:  Procedure Laterality Date  . CORONARY ARTERY BYPASS GRAFT N/A 06/23/2017   Procedure: CORONARY ARTERY BYPASS GRAFTING (CABG) x 3; Using Left Internal Mammary Artery and Right Leg Greater Saphenous Vein harvested endoscopically.;  Surgeon: Kerin Perna, MD;  Location: [facility] OR;  Service: Open Heart Surgery;  Laterality: N/A;  . LEFT HEART CATH AND CORONARY ANGIOGRAPHY N/A 06/18/2017   Procedure: LEFT HEART CATH AND CORONARY ANGIOGRAPHY;  Surgeon: Lennette Bihari, MD;  Location: [facility] INVASIVE CV LAB;  Service: Cardiovascular;  Laterality: N/A;  . TEE WITHOUT CARDIOVERSION N/A 06/23/2017   Procedure: TRANSESOPHAGEAL ECHOCARDIOGRAM (TEE);  Surgeon: Donata Clay, Theron Arista, MD;  Location: [facility]  OR;  Service: Open Heart Surgery;  Laterality: N/A;   Family History:  Family History  Problem Relation Age of Onset  . CAD Mother   . Congestive Heart Failure Father    Family Psychiatric  History: Denied Tobacco Screening: Have you used any form of tobacco in the last 30 days? (Cigarettes, Smokeless Tobacco, Cigars, and/or Pipes): Yes Tobacco use, Select all that apply: 5 or more cigarettes per day Are you interested in Tobacco Cessation Medications?: Yes, will notify MD for an order Counseled patient on smoking cessation including recognizing danger situations, developing coping skills and basic information about quitting provided: Refused/Declined practical counseling Social History:  Social History   Substance and Sexual Activity  Alcohol Use Yes   Comment: 1-2  beers a week     Social History   Substance and Sexual Activity  Drug Use Yes  . Types: Marijuana, Cocaine    Additional Social History:                           Allergies:   Allergies  Allergen Reactions  . Zyprexa [Olanzapine] Itching and Other (See Comments)   Lab Results:  Results for orders placed or performed  during the hospital encounter of 01/27/18 (from the past 48 hour(s))  Comprehensive metabolic panel     Status: Abnormal   Collection Time: 01/27/18  8:04 AM  Result Value Ref Range   Sodium 131 (L) 135 - 145 mmol/L   Potassium 3.8 3.5 - 5.1 mmol/L   Chloride 100 98 - 111 mmol/L   CO2 18 (L) 22 - 32 mmol/L   Glucose, Bld 312 (H) 70 - 99 mg/dL   BUN 12 6 - 20 mg/dL   Creatinine, Ser 1.611.07 0.61 - 1.24 mg/dL   Calcium 8.8 (L) 8.9 - 10.3 mg/dL   Total Protein 6.5 6.5 - 8.1 g/dL   Albumin 3.6 3.5 - 5.0 g/dL   AST 26 15 - 41 U/L   ALT 25 0 - 44 U/L   Alkaline Phosphatase 80 38 - 126 U/L   Total Bilirubin 0.5 0.3 - 1.2 mg/dL   GFR calc non Af Amer >60 >60 mL/min   GFR calc Af Amer >60 >60 mL/min   Anion gap 13 5 - 15    Comment: Performed at [facility], [address]  [place], [place] [address]  Troponin I - ONCE - STAT     Status: None   Collection Time: 01/27/18  8:04 AM  Result Value Ref Range   Troponin I <0.03 <0.03 ng/mL    Comment: Performed at [facility], [address] [place], [place] [address]  Salicylate level     Status: None   Collection Time: 01/27/18  8:06 AM  Result Value Ref Range   Salicylate Lvl <7.0 2.8 - 30.0 mg/dL    Comment: Performed at [facility], [address] [place], [place] [address]  Acetaminophen level     Status: Abnormal   Collection Time: 01/27/18  8:06 AM  Result Value Ref Range   Acetaminophen (Tylenol), Serum <10 (L) 10 - 30 ug/mL    Comment: (NOTE) Therapeutic concentrations vary significantly. A range of 10-30 ug/mL  may be an effective concentration for many patients. However, some  are best treated at concentrations outside of this range. Acetaminophen concentrations >150 ug/mL at 4 hours after ingestion  and >50 ug/mL at 12 hours after ingestion are often associated with  toxic reactions. Performed at [facility], [address] [place], [place] [address]   Ethanol     Status: None   Collection Time: 01/27/18  8:06 AM  Result Value Ref Range   Alcohol, Ethyl (B) <10 <10 mg/dL    Comment: (NOTE) Lowest detectable limit for serum alcohol is 10 mg/dL. For medical purposes only. Performed at [facility], [address] [place], [place] [address]   Rapid urine drug screen (hospital performed)     Status: Abnormal   Collection Time: 01/27/18  8:06 AM  Result Value Ref Range   Opiates NONE DETECTED NONE DETECTED   Cocaine POSITIVE (A) NONE DETECTED   Benzodiazepines NONE DETECTED NONE DETECTED   Amphetamines NONE DETECTED NONE DETECTED   Tetrahydrocannabinol NONE DETECTED NONE DETECTED   Barbiturates NONE DETECTED NONE DETECTED    Comment: (NOTE) DRUG SCREEN FOR MEDICAL PURPOSES ONLY.  IF CONFIRMATION IS NEEDED FOR ANY PURPOSE, NOTIFY LAB WITHIN 5 DAYS. LOWEST  DETECTABLE LIMITS FOR URINE DRUG SCREEN Drug Class                     Cutoff (ng/mL) Amphetamine and metabolites    1000 Barbiturate and metabolites  200 Benzodiazepine                 200 Tricyclics and metabolites     300 Opiates and metabolites        300 Cocaine and metabolites        300 THC                            50 Performed at [facility], [address], [place], [place] [address]   Urinalysis, Routine w reflex microscopic     Status: Abnormal   Collection Time: 01/27/18  8:06 AM  Result Value Ref Range   Color, Urine STRAW (A) YELLOW   APPearance CLEAR CLEAR   Specific Gravity, Urine 1.002 (L) 1.005 - 1.030   pH 6.0 5.0 - 8.0   Glucose, UA >=500 (A) NEGATIVE mg/dL   Hgb urine dipstick SMALL (A) NEGATIVE   Bilirubin Urine NEGATIVE NEGATIVE   Ketones, ur 5 (A) NEGATIVE mg/dL   Protein, ur NEGATIVE NEGATIVE mg/dL   Nitrite NEGATIVE NEGATIVE   Leukocytes, UA NEGATIVE NEGATIVE   RBC / HPF 0-5 0 - 5 RBC/hpf   Bacteria, UA NONE SEEN NONE SEEN   Squamous Epithelial / LPF 0-5 0 - 5   Sperm, UA PRESENT     Comment: Performed at [facility], [address] [place], [place] [address]  I-Stat Chem 8, ED     Status: Abnormal   Collection Time: 01/27/18  2:05 PM  Result Value Ref Range   Sodium 134 (L) 135 - 145 mmol/L   Potassium 3.9 3.5 - 5.1 mmol/L   Chloride 104 98 - 111 mmol/L   BUN 9 6 - 20 mg/dL   Creatinine, Ser 6.64 0.61 - 1.24 mg/dL   Glucose, Bld 403 (H) 70 - 99 mg/dL   Calcium, Ion 4.74 (L) 1.15 - 1.40 mmol/L   TCO2 23 22 - 32 mmol/L   Hemoglobin 12.6 (L) 13.0 - 17.0 g/dL   HCT 25.9 (L) 56.3 - 87.5 %    Blood Alcohol level:  Lab Results  Component Value Date   ETH <10 01/27/2018   ETH <10 01/02/2018    Metabolic Disorder Labs:  Lab Results  Component Value Date   HGBA1C 10.0 (H) 10/01/2017   MPG 240.3 10/01/2017   MPG 165.68 06/17/2017   No results found for: PROLACTIN Lab Results  Component Value Date   CHOL 209 (H)  10/01/2017   TRIG 128 10/01/2017   HDL 32 (L) 10/01/2017   CHOLHDL 6.5 10/01/2017   VLDL 26 10/01/2017   LDLCALC 151 (H) 10/01/2017   LDLCALC 146 (H) 06/19/2017    Current Medications: Current Facility-Administered Medications  Medication Dose Route Frequency Provider Last Rate Last Dose  . diclofenac sodium (VOLTAREN) 1 % transdermal gel 2 g  2 g Topical BID PRN Rankin, Shuvon B, NP      . furosemide (LASIX) tablet 40 mg  40 mg Oral Daily Rankin, Shuvon B, NP   40 mg at 01/28/18 0748  . lisinopril (PRINIVIL,ZESTRIL) tablet 5 mg  5 mg Oral Daily Rankin, Shuvon B, NP   5 mg at 01/28/18 0748  . metFORMIN (GLUCOPHAGE) tablet 1,000 mg  1,000 mg Oral BID WC Rankin, Shuvon B, NP   1,000 mg at 01/28/18 0748  . metoprolol tartrate (LOPRESSOR) tablet 50 mg  50 mg Oral BID Rankin, Shuvon B, NP   50 mg at 01/28/18 0748  .  nitroGLYCERIN (NITROSTAT) SL tablet 0.4 mg  0.4 mg Sublingual Q5 Min x 3 PRN Rankin, Shuvon B, NP      . potassium chloride SA (K-DUR,KLOR-CON) CR tablet 20 mEq  20 mEq Oral Daily Rankin, Shuvon B, NP   20 mEq at 01/28/18 0748  . sertraline (ZOLOFT) tablet 100 mg  100 mg Oral Daily Rankin, Shuvon B, NP   100 mg at 01/28/18 0748  . traZODone (DESYREL) tablet 150 mg  150 mg Oral QHS Rankin, Shuvon B, NP       PTA Medications: Medications Prior to Admission  Medication Sig Dispense Refill Last Dose  . aspirin 325 MG EC tablet Take 1 tablet (325 mg total) by mouth daily. (May buy from over the counter): For heart health. 30 tablet 0 01/27/2018 at Unknown time  . atorvastatin (LIPITOR) 80 MG tablet Take 1 tablet (80 mg total) by mouth daily at 6 PM. For high cholesterol (Patient not taking: Reported on 01/02/2018) 30 tablet 0 Not Taking at Unknown time  . diclofenac sodium (VOLTAREN) 1 % GEL Apply 2 g topically 2 (two) times daily as needed (leg pain).   Past Week at Unknown time  . furosemide (LASIX) 40 MG tablet Take 1 tablet (40 mg total) by mouth daily. For swellings 30 tablet 0  01/26/2018 at Unknown time  . lisinopril (PRINIVIL,ZESTRIL) 5 MG tablet Take 1 tablet (5 mg total) by mouth daily. For high blood pressure 30 tablet 0 01/27/2018 at Unknown time  . Melatonin 3 MG TABS Take 2 tablets (6 mg total) by mouth at bedtime as needed (for sleep). 30 tablet 0 UNK  . metFORMIN (GLUCOPHAGE) 1000 MG tablet Take 1 tablet (1,000 mg total) by mouth 2 (two) times daily with a meal. For diabetes 60 tablet 0 01/26/2018 at Unknown time  . metoprolol tartrate (LOPRESSOR) 50 MG tablet Take 1 tablet (50 mg total) by mouth 2 (two) times daily. For high blood pressure 30 tablet 0 01/27/2018 at 0600  . naltrexone (DEPADE) 50 MG tablet Take 1 tablet (50 mg total) by mouth daily. For alcohol/drug addiction (Patient not taking: Reported on 01/27/2018) 30 tablet 0 Not Taking at Unknown time  . Naltrexone 380 MG SUSR Inject 380 mg into the muscle every 30 (thirty) days.    mid December  . nicotine (NICODERM CQ - DOSED IN MG/24 HOURS) 21 mg/24hr patch Place 1 patch (21 mg total) onto the skin daily. (May buy from over the counter): For smoking cessation (Patient not taking: Reported on 01/02/2018) 28 patch 0 Not Taking at Unknown time  . potassium chloride SA (K-DUR,KLOR-CON) 20 MEQ tablet Take 1 tablet (20 mEq total) by mouth daily. For low potassium 10 tablet 0 01/26/2018 at Unknown time  . sertraline (ZOLOFT) 100 MG tablet Take 1 tablet (100 mg total) by mouth daily. 30 tablet 0 01/26/2018 at Unknown time  . traZODone (DESYREL) 150 MG tablet Take 1 tablet (150 mg total) by mouth at bedtime. For sleep 30 tablet 0 01/26/2018 at Unknown time    Musculoskeletal: Strength & Muscle Tone: decreased Gait & Station: unsteady Patient leans: N/A  Psychiatric Specialty Exam: Physical Exam  Nursing note and vitals reviewed. Constitutional: He is oriented to person, place, and time. He appears well-developed and well-nourished.  HENT:  Head: Normocephalic and atraumatic.  Respiratory: Effort normal.   Neurological: He is alert and oriented to person, place, and time.    ROS  Blood pressure 126/82, pulse 77, temperature 98.4 F (36.9 C), temperature source  Oral, resp. rate 18, height 5\' 10"  (1.778 m), weight 86.2 kg, SpO2 100 %.Body mass index is 27.26 kg/m.  General Appearance: Disheveled  Eye Contact:  Minimal  Speech:  Slow  Volume:  Decreased  Mood:  Depressed  Affect:  Congruent  Thought Process:  Coherent and Descriptions of Associations: Circumstantial  Orientation:  Full (Time, Place, and Person)  Thought Content:  Logical  Suicidal Thoughts:  No  Homicidal Thoughts:  No  Memory:  Immediate;   Poor Recent;   Poor Remote;   Poor  Judgement:  Impaired  Insight:  Lacking  Psychomotor Activity:  Decreased  Concentration:  Concentration: Poor and Attention Span: Poor  Recall:  [employer] of Knowledge:  Fair  Language:  Fair  Akathisia:  Negative  Handed:  Right  AIMS (if indicated):     Assets:  Desire for Improvement Resilience  ADL's:  Intact  Cognition:  WNL  Sleep:  Number of Hours: 9.75    Treatment Plan Summary: Daily contact with patient to assess and evaluate symptoms and progress in treatment, Medication management and Plan : Patient is seen and examined.  Patient is a 53 year old male with the above-stated past medical and past psychiatric history was admitted secondary to suicidal ideation.  His main request this morning is for placement to the [facility] rescue mission secondary to his homelessness.  He has a chronic history of cocaine dependence.  He has had issues with alcohol in the past, but his blood alcohol was negative on admission, and as well as MCV and liver function enzymes were normal.  We will monitor him for withdrawal problems.  We will continue his treatment for hypertension as well as diabetes mellitus.  He will be placed on point-of-care testing for his glucoses.  His vital signs are stable, he is afebrile.  He stated he had been compliant with  his psychiatric medications.  He will continue on his sertraline and trazodone as he had at discharge.  Observation Level/Precautions:  Detox Fall 15 minute checks Seizure  Laboratory:  Chemistry Profile  Psychotherapy:    Medications:    Consultations:    Discharge Concerns:    Estimated LOS:  Other:     Physician Treatment Plan for Primary Diagnosis: <principal problem not specified> Long Term Goal(s): Improvement in symptoms so as ready for discharge  Short Term Goals: Ability to identify changes in lifestyle to reduce recurrence of condition will improve, Ability to verbalize feelings will improve, Ability to disclose and discuss suicidal ideas, Ability to demonstrate self-control will improve, Ability to identify and develop effective coping behaviors will improve, Ability to maintain clinical measurements within normal limits will improve, Compliance with prescribed medications will improve and Ability to identify triggers associated with substance abuse/mental health issues will improve  Physician Treatment Plan for Secondary Diagnosis: Active Problems:   MDD (major depressive disorder), severe (HCC)  Long Term Goal(s): Improvement in symptoms so as ready for discharge  Short Term Goals: Ability to identify changes in lifestyle to reduce recurrence of condition will improve, Ability to verbalize feelings will improve, Ability to disclose and discuss suicidal ideas, Ability to demonstrate self-control will improve, Ability to identify and develop effective coping behaviors will improve, Ability to maintain clinical measurements within normal limits will improve, Compliance with prescribed medications will improve and Ability to identify triggers associated with substance abuse/mental health issues will improve  I certify that inpatient services furnished can reasonably be expected to improve the patient's condition.    Tammy Sours  Addison Lank, MD 1/2/202011:50 AM
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
## 2018-01-28 NOTE — Progress Notes (Signed)
Psychoeducational Group Note  Date:  01/28/2018 Time: 2030 Group Topic/Focus:  wrap up group  Participation Level: Did Not Attend  Participation Quality:  Not Applicable  Affect:  Not Applicable  Cognitive:  Not Applicable  Insight:  Not Applicable  Engagement in Group: Not Applicable  Additional Comments:  Pt was notified that group was beginning but remained in bed.   Marcus Cummings S 01/28/2018, 10:00 PM

## 2018-01-28 NOTE — BHH Suicide Risk Assessment (Signed)
[facility] Admission Suicide Risk Assessment   Nursing information obtained from:  Review of record, Patient Demographic factors:  Male, Divorced or widowed, Living alone Current Mental Status:  Suicidal ideation indicated by patient Loss Factors:  Financial problems / change in socioeconomic status Historical Factors:  Prior suicide attempts Risk Reduction Factors:  Positive social support  Total Time spent with patient: 30 minutes Principal Problem: <principal problem not specified> Diagnosis:  Active Problems:   MDD (major depressive disorder), severe (HCC)  Subjective Data: Patient is seen and examined.  Patient is a 53 year old male with a past psychiatric history significant for bipolar disorder, PTSD, substance abuse involving alcohol, cocaine and cannabis.  The patient presented to the [facility] emergency department with suicidal ideation.  He stated he came in due to medical reasons, and then stated he was suicidal with a plan to kill himself by cutting his wrist.  He apparently overdosed on pills 5 years ago.  He has admitted to some homicidal ideation but is nonspecific.  He is being followed by the [facility] for his mental health treatment.  He has been admitted to our facility on several occasions.  His last admission here was on 12/29/2017.  He was discharged on 01/02/2018.  He stated that he has been homeless for the last month.  He stated that the place he was staying at evicted him because of his substance abuse.  He is requesting to go to the [facility].  His drug screen was positive for cocaine, but his blood alcohol was less than 10.  His liver function enzymes were normal.  His MCV is normal.  He also has a past medical history significant for hypertension, diabetes mellitus, status post myocardial infarction with stent placement and status post CVA.  He was admitted to the hospital for evaluation and stabilization.  Continued Clinical  Symptoms:  Alcohol Use Disorder Identification Test Final Score (AUDIT): 25 The "Alcohol Use Disorders Identification Test", Guidelines for Use in Primary Care, Second Edition.  World Science writer [facility]). Score between 0-7:  no or low risk or alcohol related problems. Score between 8-15:  moderate risk of alcohol related problems. Score between 16-19:  high risk of alcohol related problems. Score 20 or above:  warrants further diagnostic evaluation for alcohol dependence and treatment.   CLINICAL FACTORS:   Bipolar Disorder:   Depressive phase Depression:   Anhedonia Comorbid alcohol abuse/dependence Delusional Hopelessness Impulsivity Insomnia Alcohol/Substance Abuse/Dependencies More than one psychiatric diagnosis Unstable or Poor Therapeutic Relationship Previous Psychiatric Diagnoses and Treatments Medical Diagnoses and Treatments/Surgeries   Musculoskeletal: Strength & Muscle Tone: decreased Gait & Station: ataxic Patient leans: Right  Psychiatric Specialty Exam: Physical Exam  Nursing note and vitals reviewed. Constitutional: He is oriented to person, place, and time. He appears well-developed and well-nourished.  HENT:  Head: Normocephalic and atraumatic.  Respiratory: Effort normal.  Neurological: He is alert and oriented to person, place, and time.    ROS  Blood pressure 126/82, pulse 77, temperature 98.4 F (36.9 C), temperature source Oral, resp. rate 18, height 5\' 10"  (1.778 m), weight 86.2 kg, SpO2 100 %.Body mass index is 27.26 kg/m.  General Appearance: Disheveled  Eye Contact:  Minimal  Speech:  Normal Rate  Volume:  Decreased  Mood:  Depressed  Affect:  Congruent  Thought Process:  Coherent and Descriptions of Associations: Intact  Orientation:  Full (Time, Place, and Person)  Thought Content:  Logical  Suicidal Thoughts:  Yes.  without intent/plan  Homicidal Thoughts:  No  Memory:  Immediate;   Fair Recent;   Fair Remote;   Fair   Judgement:  Impaired  Insight:  Lacking  Psychomotor Activity:  Decreased  Concentration:  Concentration: Fair and Attention Span: Fair  Recall:  FiservFair  Fund of Knowledge:  Fair  Language:  Fair  Akathisia:  Negative  Handed:  Right  AIMS (if indicated):     Assets:  Desire for Improvement Resilience  ADL's:  Intact  Cognition:  WNL  Sleep:  Number of Hours: 9.75      COGNITIVE FEATURES THAT CONTRIBUTE TO RISK:  None    SUICIDE RISK:   Minimal: No identifiable suicidal ideation.  Patients presenting with no risk factors but with morbid ruminations; may be classified as minimal risk based on the severity of the depressive symptoms  PLAN OF CARE: Patient is seen and examined.  Patient is a 53 year old male with the above-stated past medical and past psychiatric history was admitted secondary to suicidal ideation.  His main request this morning [facility] to his [facility]ness.  He has a chronic history of cocaine dependence.  He has had issues with alcohol in the past, but his blood alcohol was negative on admission, and as well as MCV and liver function enzymes were normal.  We will monitor him for withdrawal problems.  We will continue his treatment for hypertension as well as diabetes mellitus.  He will be placed on point-of-care testing for his glucoses.  His vital signs are stable, he is afebrile.  He stated he had been compliant with his psychiatric medications.  He will continue on his sertraline and trazodone as he had at discharge.  I certify that inpatient services furnished can reasonably be expected to improve the patient's condition.   Antonieta PertGreg Lawson Clary, MD 01/28/2018, 8:12 AM

## 2018-01-28 NOTE — Plan of Care (Signed)
  Problem: Education: Goal: Verbalization of understanding the information provided will improve Outcome: Progressing   Problem: Activity: Goal: Interest or engagement in activities will improve Outcome: Progressing Goal: Sleeping patterns will improve Outcome: Progressing   Problem: Coping: Goal: Ability to demonstrate self-control will improve Outcome: Progressing   Problem: Education: Goal: Emotional status will improve Outcome: Not Progressing Goal: Mental status will improve Outcome: Not Progressing

## 2018-01-29 DIAGNOSIS — F322 Major depressive disorder, single episode, severe without psychotic features: Secondary | ICD-10-CM

## 2018-01-29 LAB — GLUCOSE, CAPILLARY
Glucose-Capillary: 225 mg/dL — ABNORMAL HIGH (ref 70–99)
Glucose-Capillary: 247 mg/dL — ABNORMAL HIGH (ref 70–99)
Glucose-Capillary: 142 mg/dL — ABNORMAL HIGH (ref 70–99)
Glucose-Capillary: 258 mg/dL — ABNORMAL HIGH (ref 70–99)

## 2018-01-29 MED ORDER — METOPROLOL TARTRATE 25 MG PO TABS
25.0000 mg | ORAL_TABLET | Freq: Two times a day (BID) | ORAL | Status: DC
  Administered 2018-01-30 – 2018-02-01 (×5): 25 mg via ORAL
  Filled 2018-01-29: qty 28
  Filled 2018-01-29 (×2): qty 1
  Filled 2018-01-29: qty 28
  Filled 2018-01-29 (×6): qty 1

## 2018-01-29 NOTE — Plan of Care (Addendum)
Patient slept fair last night, sleep medication was requested and was not helpful. Appetite is fair, energy level low, and concentration poor. Depression, hopelessness, and anxiety all rated 10/10. Patient endorses SI "almost all the time." Patient rates chronic leg pain 10/10. Patient's goal is to "go to groups" Patient is compliant with medications prescribed per provider. No side effects noted. Safety is maintained with 15 minute checks as well as environmental checks. Will continue to monitor and provide support.  Problem: Activity: Goal: Sleeping patterns will improve Outcome: Progressing   Problem: Coping: Goal: Ability to verbalize frustrations and anger appropriately will improve Outcome: Progressing Goal: Ability to demonstrate self-control will improve Outcome: Progressing   Problem: Education: Goal: Emotional status will improve Outcome: Not Progressing Goal: Mental status will improve Outcome: Not Progressing

## 2018-01-29 NOTE — Progress Notes (Signed)
Pt has been in the bed most of the evening.  He came out shortly before 2200 to get a snack and his medications.  His CBG was 213, so he required night time insulin which was given.  Pt denies SI/HI/AVH at this time.  He says he is having mild withdrawal symptoms at this time.  Scheduled sleep med was given.  Support and encouragement offered.  Discharge plans are in process.  Pt wants to go to [facility].  Safety maintained with q15 minute checks.

## 2018-01-29 NOTE — BHH Group Notes (Signed)
The focus of this group is to help patients establish daily goals to achieve during treatment and discuss how the patient can incorporate goal setting into their daily lives to aide in recovery.  PT did not attend 

## 2018-01-29 NOTE — Tx Team (Signed)
Interdisciplinary Treatment and Diagnostic Plan Update  01/29/2018 Time of Session: 9:00am Marcus BatheGregory L Kawashima Sr. MRN: [id number]  Principal Diagnosis: <principal problem not specified>  Secondary Diagnoses: Active Problems:   MDD (major depressive disorder), severe (HCC)   Current Medications:  Current Facility-Administered Medications  Medication Dose Route Frequency Provider Last Rate Last Dose  . diclofenac sodium (VOLTAREN) 1 % transdermal gel 2 g  2 g Topical BID PRN Rankin, Shuvon B, NP   2 g at 01/29/18 0942  . furosemide (LASIX) tablet 40 mg  40 mg Oral Daily Rankin, Shuvon B, NP   40 mg at 01/29/18 0805  . insulin aspart (novoLOG) injection 0-15 Units  0-15 Units Subcutaneous TID WC Antonieta Pertlary, Greg Lawson, MD   5 Units at 01/29/18 0636  . insulin aspart (novoLOG) injection 0-5 Units  0-5 Units Subcutaneous QHS Antonieta Pertlary, Greg Lawson, MD   2 Units at 01/28/18 2148  . lisinopril (PRINIVIL,ZESTRIL) tablet 5 mg  5 mg Oral Daily Rankin, Shuvon B, NP   5 mg at 01/29/18 0805  . metFORMIN (GLUCOPHAGE) tablet 1,000 mg  1,000 mg Oral BID WC Rankin, Shuvon B, NP   1,000 mg at 01/29/18 0805  . metoprolol tartrate (LOPRESSOR) tablet 50 mg  50 mg Oral BID Rankin, Shuvon B, NP   50 mg at 01/29/18 0805  . nitroGLYCERIN (NITROSTAT) SL tablet 0.4 mg  0.4 mg Sublingual Q5 Min x 3 PRN Rankin, Shuvon B, NP      . potassium chloride SA (K-DUR,KLOR-CON) CR tablet 20 mEq  20 mEq Oral Daily Rankin, Shuvon B, NP   20 mEq at 01/29/18 0805  . sertraline (ZOLOFT) tablet 100 mg  100 mg Oral Daily Rankin, Shuvon B, NP   100 mg at 01/29/18 0805  . traZODone (DESYREL) tablet 150 mg  150 mg Oral QHS Rankin, Shuvon B, NP   150 mg at 01/28/18 2147   PTA Medications: Medications Prior to Admission  Medication Sig Dispense Refill Last Dose  . aspirin 325 MG EC tablet Take 1 tablet (325 mg total) by mouth daily. (May buy from over the counter): For heart health. 30 tablet 0 01/27/2018 at Unknown time  . atorvastatin (LIPITOR)  80 MG tablet Take 1 tablet (80 mg total) by mouth daily at 6 PM. For high cholesterol (Patient not taking: Reported on 01/02/2018) 30 tablet 0 Not Taking at Unknown time  . diclofenac sodium (VOLTAREN) 1 % GEL Apply 2 g topically 2 (two) times daily as needed (leg pain).   Past Week at Unknown time  . furosemide (LASIX) 40 MG tablet Take 1 tablet (40 mg total) by mouth daily. For swellings 30 tablet 0 01/26/2018 at Unknown time  . lisinopril (PRINIVIL,ZESTRIL) 5 MG tablet Take 1 tablet (5 mg total) by mouth daily. For high blood pressure 30 tablet 0 01/27/2018 at Unknown time  . Melatonin 3 MG TABS Take 2 tablets (6 mg total) by mouth at bedtime as needed (for sleep). 30 tablet 0 UNK  . metFORMIN (GLUCOPHAGE) 1000 MG tablet Take 1 tablet (1,000 mg total) by mouth 2 (two) times daily with a meal. For diabetes 60 tablet 0 01/26/2018 at Unknown time  . metoprolol tartrate (LOPRESSOR) 50 MG tablet Take 1 tablet (50 mg total) by mouth 2 (two) times daily. For high blood pressure 30 tablet 0 01/27/2018 at 0600  . naltrexone (DEPADE) 50 MG tablet Take 1 tablet (50 mg total) by mouth daily. For alcohol/drug addiction (Patient not taking: Reported on 01/27/2018) 30 tablet 0  Not Taking at Unknown time  . Naltrexone 380 MG SUSR Inject 380 mg into the muscle every 30 (thirty) days.    mid December  . nicotine (NICODERM CQ - DOSED IN MG/24 HOURS) 21 mg/24hr patch Place 1 patch (21 mg total) onto the skin daily. (May buy from over the counter): For smoking cessation (Patient not taking: Reported on 01/02/2018) 28 patch 0 Not Taking at Unknown time  . potassium chloride SA (K-DUR,KLOR-CON) 20 MEQ tablet Take 1 tablet (20 mEq total) by mouth daily. For low potassium 10 tablet 0 01/26/2018 at Unknown time  . sertraline (ZOLOFT) 100 MG tablet Take 1 tablet (100 mg total) by mouth daily. 30 tablet 0 01/26/2018 at Unknown time  . traZODone (DESYREL) 150 MG tablet Take 1 tablet (150 mg total) by mouth at bedtime. For sleep 30 tablet  0 01/26/2018 at Unknown time    Patient Stressors: Health problems Medication change or noncompliance Substance abuse  Patient Strengths: Active sense of humor Average or above average intelligence Motivation for treatment/growth  Treatment Modalities: Medication Management, Group therapy, Case management,  1 to 1 session with clinician, Psychoeducation, Recreational therapy.   Physician Treatment Plan for Primary Diagnosis: <principal problem not specified> Long Term Goal(s): Improvement in symptoms so as ready for discharge Improvement in symptoms so as ready for discharge   Short Term Goals: Ability to identify changes in lifestyle to reduce recurrence of condition will improve Ability to verbalize feelings will improve Ability to disclose and discuss suicidal ideas Ability to demonstrate self-control will improve Ability to identify and develop effective coping behaviors will improve Ability to maintain clinical measurements within normal limits will improve Compliance with prescribed medications will improve Ability to identify triggers associated with substance abuse/mental health issues will improve Ability to identify changes in lifestyle to reduce recurrence of condition will improve Ability to verbalize feelings will improve Ability to disclose and discuss suicidal ideas Ability to demonstrate self-control will improve Ability to identify and develop effective coping behaviors will improve Ability to maintain clinical measurements within normal limits will improve Compliance with prescribed medications will improve Ability to identify triggers associated with substance abuse/mental health issues will improve  Medication Management: Evaluate patient's response, side effects, and tolerance of medication regimen.  Therapeutic Interventions: 1 to 1 sessions, Unit Group sessions and Medication administration.  Evaluation of Outcomes: Progressing  Physician Treatment  Plan for Secondary Diagnosis: Active Problems:   MDD (major depressive disorder), severe (HCC)  Long Term Goal(s): Improvement in symptoms so as ready for discharge Improvement in symptoms so as ready for discharge   Short Term Goals: Ability to identify changes in lifestyle to reduce recurrence of condition will improve Ability to verbalize feelings will improve Ability to disclose and discuss suicidal ideas Ability to demonstrate self-control will improve Ability to identify and develop effective coping behaviors will improve Ability to maintain clinical measurements within normal limits will improve Compliance with prescribed medications will improve Ability to identify triggers associated with substance abuse/mental health issues will improve Ability to identify changes in lifestyle to reduce recurrence of condition will improve Ability to verbalize feelings will improve Ability to disclose and discuss suicidal ideas Ability to demonstrate self-control will improve Ability to identify and develop effective coping behaviors will improve Ability to maintain clinical measurements within normal limits will improve Compliance with prescribed medications will improve Ability to identify triggers associated with substance abuse/mental health issues will improve     Medication Management: Evaluate patient's response, side effects, and tolerance  of medication regimen.  Therapeutic Interventions: 1 to 1 sessions, Unit Group sessions and Medication administration.  Evaluation of Outcomes: Progressing   RN Treatment Plan for Primary Diagnosis: <principal problem not specified> Long Term Goal(s): Knowledge of disease and therapeutic regimen to maintain health will improve  Short Term Goals: Ability to remain free from injury will improve, Ability to verbalize feelings will improve, Ability to disclose and discuss suicidal ideas and Ability to identify and develop effective coping behaviors  will improve  Medication Management: RN will administer medications as ordered by provider, will assess and evaluate patient's response and provide education to patient for prescribed medication. RN will report any adverse and/or side effects to prescribing provider.  Therapeutic Interventions: 1 on 1 counseling sessions, Psychoeducation, Medication administration, Evaluate responses to treatment, Monitor vital signs and CBGs as ordered, Perform/monitor CIWA, COWS, AIMS and Fall Risk screenings as ordered, Perform wound care treatments as ordered.  Evaluation of Outcomes: Progressing   LCSW Treatment Plan for Primary Diagnosis: <principal problem not specified> Long Term Goal(s): Safe transition to appropriate next level of care at discharge, Engage patient in therapeutic group addressing interpersonal concerns.  Short Term Goals: Engage patient in aftercare planning with referrals and resources, Increase social support, Increase emotional regulation, Facilitate patient progression through stages of change regarding substance use diagnoses and concerns, Identify triggers associated with mental health/substance abuse issues and Increase skills for wellness and recovery  Therapeutic Interventions: Assess for all discharge needs, 1 to 1 time with Social worker, Explore available resources and support systems, Assess for adequacy in community support network, Educate family and significant other(s) on suicide prevention, Complete Psychosocial Assessment, Interpersonal group therapy.  Evaluation of Outcomes: Progressing   Progress in Treatment: Attending groups: No. Participating in groups: No. Taking medication as prescribed: Yes. Toleration medication: Yes. Family/Significant other contact made: No, will contact:  declines consents Patient understands diagnosis: Yes. Discussing patient identified problems/goals with staff: Yes. Medical problems stabilized or resolved: No. Denies  suicidal/homicidal ideation: Yes. Issues/concerns per patient self-inventory: Yes.  New problem(s) identified: Yes, Describe:  homeless, wants to go to [employer] [facility] Short Term/Long Term Goal(s): detox, medication management for mood stabilization; elimination of SI thoughts; development of comprehensive mental wellness/sobriety plan.  Patient Goals:  "Get clean"  Discharge Plan or Barriers: Patient likely to discharge to [facility] on Monday. [facility] pamphlet, Mobile Crisis information, and AA/NA information provided to patient for additional community support and resources.   Reason for Continuation of Hospitalization: Anxiety Depression Medication stabilization Withdrawal symptoms  Estimated Length of Stay: 3 days  Attendees: Patient: 01/29/2018 9:55 AM  Physician:  01/29/2018 9:55 AM  Nursing:  01/29/2018 9:55 AM  RN Care Manager: 01/29/2018 9:55 AM  Social Worker: Enid Cutter, LCSWA 01/29/2018 9:55 AM  Recreational Therapist:  01/29/2018 9:55 AM  Other:  01/29/2018 9:55 AM  Other:  01/29/2018 9:55 AM  Other: 01/29/2018 9:55 AM    Scribe for Treatment Team: Darreld Mclean, LCSWA 01/29/2018 9:55 AM

## 2018-01-29 NOTE — Progress Notes (Signed)
D: Pt passive SI/ AH- contracts for safety . Pt stated he felt the same as when he came in. Pt spent majority of the evening in his room.  A: Pt was offered support and encouragement. Pt was given scheduled medications. Pt was encourage to attend groups. Q 15 minute checks were done for safety.  R: Pt is taking medication. Pt has no complaints.Pt receptive to treatment and safety maintained on unit.  Problem: Education: Goal: Mental status will improve Outcome: Progressing   Problem: Activity: Goal: Interest or engagement in activities will improve Outcome: Not Progressing   Problem: Activity: Goal: Sleeping patterns will improve Outcome: Progressing

## 2018-01-29 NOTE — Progress Notes (Signed)
[facility] MD Progress Note  01/29/2018 10:28 AM Marcus Bathe Sr.  MRN:  [id number] Subjective:    Patient well-known to the service readmitted complaining of suicidal thoughts but has a longstanding history of secondary gain issues due to homelessness, has chronic substance abuse issues, and once again presented with a presentation similar to that of last month.  He is been in bed he tells me he does not have any acute withdrawal symptoms he has thoughts of harming himself that are vague and can contract here he acknowledges depression.  He reports no involuntary movements none are noted his vital signs are generally normal his blood pressure is a bit low Principal Problem: Various psychiatric diagnoses to include bipolar, depression, PTSD, alcohol, cocaine and cannabis dependencies and abuse, chronic homelessness and chronic issues of secondary gain Diagnosis: Active Problems:   MDD (major depressive disorder), severe (HCC)  Total Time spent with patient: 20 minutes  Past Psychiatric History: Here last month  Past Medical History:  Past Medical History:  Diagnosis Date  . Depression   . Diabetes mellitus without complication (HCC)   . Hypertension   . MI, acute, non ST segment elevation (HCC) 2007   stents x 2 2007 [facility])  . Stroke (HCC) 2017  . Suicidal ideation     Past Surgical History:  Procedure Laterality Date  . CORONARY ARTERY BYPASS GRAFT N/A 06/23/2017   Procedure: CORONARY ARTERY BYPASS GRAFTING (CABG) x 3; Using Left Internal Mammary Artery and Right Leg Greater Saphenous Vein harvested endoscopically.;  Surgeon: Kerin Perna, MD;  Location: [facility] OR;  Service: Open Heart Surgery;  Laterality: N/A;  . LEFT HEART CATH AND CORONARY ANGIOGRAPHY N/A 06/18/2017   Procedure: LEFT HEART CATH AND CORONARY ANGIOGRAPHY;  Surgeon: Lennette Bihari, MD;  Location: [facility] INVASIVE CV LAB;  Service: Cardiovascular;  Laterality: N/A;  . TEE WITHOUT CARDIOVERSION N/A 06/23/2017   Procedure:  TRANSESOPHAGEAL ECHOCARDIOGRAM (TEE);  Surgeon: Donata Clay, Theron Arista, MD;  Location: [facility] OR;  Service: Open Heart Surgery;  Laterality: N/A;   Family History:  Family History  Problem Relation Age of Onset  . CAD Mother   . Congestive Heart Failure Father    Family Psychiatric  History: no new info Social History:  Social History   Substance and Sexual Activity  Alcohol Use Yes   Comment: 1-2  beers a week     Social History   Substance and Sexual Activity  Drug Use Yes  . Types: Marijuana, Cocaine    Social History   Socioeconomic History  . Marital status: Single    Spouse name: Not on file  . Number of children: Not on file  . Years of education: Not on file  . Highest education level: Not on file  Occupational History  . Not on file  Social Needs  . Financial resource strain: Not on file  . Food insecurity:    Worry: Not on file    Inability: Not on file  . Transportation needs:    Medical: Not on file    Non-medical: Not on file  Tobacco Use  . Smoking status: Current Every Day Smoker    Packs/day: 1.00    Types: Cigarettes  . Smokeless tobacco: Never Used  . Tobacco comment: Declined information  Substance and Sexual Activity  . Alcohol use: Yes    Comment: 1-2  beers a week  . Drug use: Yes    Types: Marijuana, Cocaine  . Sexual activity: Not Currently  Lifestyle  .  Physical activity:    Days per week: Not on file    Minutes per session: Not on file  . Stress: Not on file  Relationships  . Social connections:    Talks on phone: Not on file    Gets together: Not on file    Attends religious service: Not on file    Active member of club or organization: Not on file    Attends meetings of clubs or organizations: Not on file    Relationship status: Not on file  Other Topics Concern  . Not on file  Social History Narrative  . Not on file   Additional Social History:                         Sleep: Good  Appetite:  Good  Current  Medications: Current Facility-Administered Medications  Medication Dose Route Frequency Provider Last Rate Last Dose  . diclofenac sodium (VOLTAREN) 1 % transdermal gel 2 g  2 g Topical BID PRN Rankin, Shuvon B, NP   2 g at 01/29/18 0942  . furosemide (LASIX) tablet 40 mg  40 mg Oral Daily Rankin, Shuvon B, NP   40 mg at 01/29/18 0805  . insulin aspart (novoLOG) injection 0-15 Units  0-15 Units Subcutaneous TID WC Antonieta Pert, MD   5 Units at 01/29/18 0636  . insulin aspart (novoLOG) injection 0-5 Units  0-5 Units Subcutaneous QHS Antonieta Pert, MD   2 Units at 01/28/18 2148  . lisinopril (PRINIVIL,ZESTRIL) tablet 5 mg  5 mg Oral Daily Rankin, Shuvon B, NP   5 mg at 01/29/18 0805  . metFORMIN (GLUCOPHAGE) tablet 1,000 mg  1,000 mg Oral BID WC Rankin, Shuvon B, NP   1,000 mg at 01/29/18 0805  . metoprolol tartrate (LOPRESSOR) tablet 50 mg  50 mg Oral BID Rankin, Shuvon B, NP   50 mg at 01/29/18 0805  . nitroGLYCERIN (NITROSTAT) SL tablet 0.4 mg  0.4 mg Sublingual Q5 Min x 3 PRN Rankin, Shuvon B, NP      . potassium chloride SA (K-DUR,KLOR-CON) CR tablet 20 mEq  20 mEq Oral Daily Rankin, Shuvon B, NP   20 mEq at 01/29/18 0805  . sertraline (ZOLOFT) tablet 100 mg  100 mg Oral Daily Rankin, Shuvon B, NP   100 mg at 01/29/18 0805  . traZODone (DESYREL) tablet 150 mg  150 mg Oral QHS Rankin, Shuvon B, NP   150 mg at 01/28/18 2147    Lab Results:  Results for orders placed or performed during the hospital encounter of 01/27/18 (from the past 48 hour(s))  Glucose, capillary     Status: Abnormal   Collection Time: 01/28/18 12:14 PM  Result Value Ref Range   Glucose-Capillary 313 (H) 70 - 99 mg/dL   Comment 1 Notify RN    Comment 2 Document in Chart   Glucose, capillary     Status: Abnormal   Collection Time: 01/28/18  5:13 PM  Result Value Ref Range   Glucose-Capillary 199 (H) 70 - 99 mg/dL  Glucose, capillary     Status: Abnormal   Collection Time: 01/28/18  9:02 PM  Result Value  Ref Range   Glucose-Capillary 213 (H) 70 - 99 mg/dL   Comment 1 Notify RN    Comment 2 Document in Chart   Glucose, capillary     Status: Abnormal   Collection Time: 01/29/18  6:28 AM  Result Value Ref Range   Glucose-Capillary 225 (  H) 70 - 99 mg/dL   Comment 1 Notify RN    Comment 2 Document in Chart     Blood Alcohol level:  Lab Results  Component Value Date   ETH <10 01/27/2018   ETH <10 01/02/2018    Metabolic Disorder Labs: Lab Results  Component Value Date   HGBA1C 10.0 (H) 10/01/2017   MPG 240.3 10/01/2017   MPG 165.68 06/17/2017   No results found for: PROLACTIN Lab Results  Component Value Date   CHOL 209 (H) 10/01/2017   TRIG 128 10/01/2017   HDL 32 (L) 10/01/2017   CHOLHDL 6.5 10/01/2017   VLDL 26 10/01/2017   LDLCALC 151 (H) 10/01/2017   LDLCALC 146 (H) 06/19/2017    Physical Findings: AIMS: Facial and Oral Movements Muscles of Facial Expression: None, normal Lips and Perioral Area: None, normal Jaw: None, normal Tongue: None, normal,Extremity Movements Upper (arms, wrists, hands, fingers): None, normal Lower (legs, knees, ankles, toes): None, normal, Trunk Movements Neck, shoulders, hips: None, normal, Overall Severity Severity of abnormal movements (highest score from questions above): None, normal Incapacitation due to abnormal movements: None, normal Patient's awareness of abnormal movements (rate only patient's report): No Awareness, Dental Status Current problems with teeth and/or dentures?: No Does patient usually wear dentures?: No  CIWA:  CIWA-Ar Total: 2 COWS:     Musculoskeletal: Strength & Muscle Tone: within normal limits Gait & Station: normal Patient leans: N/A  Psychiatric Specialty Exam: Physical Exam  ROS  Blood pressure 105/68, pulse 91, temperature 98.4 F (36.9 C), temperature source Oral, resp. rate 18, height 5\' 10"  (1.778 m), weight 86.2 kg, SpO2 100 %.Body mass index is 27.26 kg/m.  General Appearance: Casual  Eye  Contact:  Poor  Speech:  Slow  Volume:  Decreased  Mood:  Dysphoric  Affect:  Constricted  Thought Process:  Coherent  Orientation:  Full (Time, Place, and Person)  Thought Content:  Logical  Suicidal Thoughts:  Yes.  without intent/plan  Homicidal Thoughts:  No  Memory:  Immediate;   Fair  Judgement:  Fair  Insight:  Fair  Psychomotor Activity: Generally in bed  Concentration:  Concentration: Fair  Recall:  FiservFair  Fund of Knowledge:  Fair  Language:  Fair  Akathisia:  Negative  Handed:  Right  AIMS (if indicated):     Assets:  Resilience  ADL's:  Intact  Cognition:  WNL  Sleep:  Number of Hours: 6     Treatment Plan Summary: Daily contact with patient to assess and evaluate symptoms and progress in treatment, Medication management and Plan Continue current detox PRN protocol continue cognitive therapy for depression continue antidepressant therapy  Marcus Leavens, MD 01/29/2018, 10:28 AM

## 2018-01-30 LAB — GLUCOSE, CAPILLARY
Glucose-Capillary: 164 mg/dL — ABNORMAL HIGH (ref 70–99)
Glucose-Capillary: 234 mg/dL — ABNORMAL HIGH (ref 70–99)
Glucose-Capillary: 271 mg/dL — ABNORMAL HIGH (ref 70–99)
Glucose-Capillary: 120 mg/dL — ABNORMAL HIGH (ref 70–99)

## 2018-01-30 NOTE — [facility] Group Notes (Signed)
LCSW Group Therapy Note  01/30/2018    10:00-11:00am   Type of Therapy and Topic:  Group Therapy: Early Messages Received About Anger  Participation Level:  Did Not Attend   Description of Group:   In this group, patients shared and discussed the early messages received in their lives about anger through parental or other adult modeling, teaching, repression, punishment, violence, and more.  Participants identified how those childhood lessons influence even now how they usually or often react when angered.  The group discussed that anger is a secondary emotion and what may be the underlying emotional themes that come out through anger outbursts or that are ignored through anger suppression.  Finally, as a group there was a conversation about the workbook's quote that "There is nothing wrong with anger; it is just a sign something needs to change."     Therapeutic Goals: 1. Patients will identify one or more childhood message about anger that they received and how it was taught to them. 2. Patients will discuss how these childhood experiences have influenced and continue to influence their own expression or repression of anger even today. 3. Patients will explore possible primary emotions that tend to fuel their secondary emotion of anger. 4. Patients will learn that anger itself is normal and cannot be eliminated, and that healthier coping skills can assist with resolving conflict rather than worsening situations.  Summary of Patient Progress:  N/A  Therapeutic Modalities:   Cognitive Behavioral Therapy Motivation Interviewing  Marcus Cummings [name]  .  

## 2018-01-30 NOTE — Progress Notes (Signed)
D: Pt passive SI/ AH- contracts for safety. Pt is pleasant and cooperative. Pt  Keeps to himself this evening. Pt  stated he was planning to go to [facility] on D/C.  A: Pt was offered support and encouragement. Pt was given scheduled medications. Pt was encourage to attend groups. Q 15 minute checks were done for safety.  R: safety maintained on unit.  Problem: Safety: Goal: Periods of time without injury will increase Outcome: Progressing   Problem: Education: Goal: Understanding of discharge needs will improve Outcome: Progressing

## 2018-01-30 NOTE — Plan of Care (Signed)
  Problem: Education: Goal: Knowledge of [facility] Education information/materials will improve Outcome: Progressing   

## 2018-01-30 NOTE — [facility] Group Notes (Signed)
[facility] Group Notes:  (Nursing)  Date:  01/30/2018  Time: 1:15 Type of Therapy:  Nurse Education  Participation Level:  Active  Participation Quality:  Attentive  Affect:  Appropriate  Cognitive:  Appropriate  Insight:  Appropriate  Engagement in Group:  Engaged  Modes of Intervention:  Education  Summary of Progress/Problems:  Identifying Needs Life Skills Group  Shela NevinValerie S Jacari Kirsten 01/30/2018, 8:11 PM

## 2018-01-30 NOTE — Progress Notes (Signed)
[facility]  01/30/2018 11:24 AM Velvet BatheGregory L Mclarty Sr.  MRN:  [id number]    Evaluation: Earl LitesGregory observed resting in bed.  Patient continues to isolate to his room most of the morning.  He denies suicidal or homicidal ideations during this assessment.  Patient reports to have a problem with cocaine use.  As he indicated a recent discharge from inpatient admission.  Denies that he utilize discharge follow-up care at previous admission.  Rates his depression 5 out of 10 with 10 being the worst on this assessment.  Earl LitesGregory reported auditory hallucinations that "come and go".  Currently denying command auditory hallucinations.  Reports taking medications as prescribed and tolerating them well.  Mild thought blocking noted, slow responses without elaboration.  Earl LitesGregory is minimally engaged throughout this assessment.  Reports a good appetite states he is resting well.  Support encouragement reassurance was provided   History: Per assessment note: Patient is a 53 year old male with a past psychiatric history significant for bipolar disorder, PTSD, substance abuse involving alcohol, cocaine and cannabis. The [facility] with suicidal ideation. He stated he came in due to medical reasons, and then stated he was suicidal with a plan to kill himself by cutting his wrist. He apparently overdosed on pills 5 years ago. He has admitted to some homicidal ideation but is nonspecific. [facility]. He has been admitted to our facility on several occasions. His last admission here was on 12/29/2017. He was discharged on 01/02/2018. He stated that he has been homeless for the last month.   Principal Problem: Various psychiatric diagnoses to include bipolar, depression, PTSD, alcohol, cocaine and cannabis dependencies and abuse, chronic homelessness and chronic issues of  secondary gain   Diagnosis: Active Problems:   MDD (major depressive disorder), severe (HCC)  Total Time spent with patient: 20 minutes  Past Psychiatric History: Here last month  Past Medical History:  Past Medical History:  Diagnosis Date  . Depression   . Diabetes mellitus without complication (HCC)   . Hypertension   . MI, acute, non ST segment elevation (HCC) 2007   stents x 2 2007 [facility] [place]  . Stroke (HCC) 2017  . Suicidal ideation     Past Surgical History:  Procedure Laterality Date  . CORONARY ARTERY BYPASS GRAFT N/A 06/23/2017   Procedure: CORONARY ARTERY BYPASS GRAFTING (CABG) x 3; Using Left Internal Mammary Artery and Right Leg Greater Saphenous Vein harvested endoscopically.;  Surgeon: Kerin PernaVan Trigt, Peter, MD;  [facility];  Service: Open Heart Surgery;  Laterality: N/A;  . LEFT HEART CATH AND CORONARY ANGIOGRAPHY N/A 06/18/2017   Procedure: LEFT HEART CATH AND CORONARY ANGIOGRAPHY;  Surgeon: Lennette BihariKelly, Thomas A, MD;  Location: [facility] INVASIVE CV LAB;  Service: Cardiovascular;  Laterality: N/A;  . TEE WITHOUT CARDIOVERSION N/A 06/23/2017   Procedure: TRANSESOPHAGEAL ECHOCARDIOGRAM (TEE);  Surgeon: Donata ClayVan Trigt, Theron AristaPeter, MD;  [facility];  Service: Open Heart Surgery;  Laterality: N/A;   Family History:  Family History  Problem Relation Age of Onset  . CAD Mother   . Congestive Heart Failure Father    Family Psychiatric  History: no new info Social History:  Social History   Substance and Sexual Activity  Alcohol Use Yes   Comment: 1-2  beers a week     Social History   Substance and Sexual Activity  Drug Use Yes  . Types: Marijuana, Cocaine  Social History   Socioeconomic History  . Marital status: Single    Spouse name: Not on file  . Number of children: Not on file  . Years of education: Not on file  . Highest education level: Not on file  Occupational History  . Not on file  Social Needs  . Financial resource strain: Not on file  . Food  insecurity:    Worry: Not on file    Inability: Not on file  . Transportation needs:    Medical: Not on file    Non-medical: Not on file  Tobacco Use  . Smoking status: Current Every Day Smoker    Packs/day: 1.00    Types: Cigarettes  . Smokeless tobacco: Never Used  . Tobacco comment: Declined information  Substance and Sexual Activity  . Alcohol use: Yes    Comment: 1-2  beers a week  . Drug use: Yes    Types: Marijuana, Cocaine  . Sexual activity: Not Currently  Lifestyle  . Physical activity:    Days per week: Not on file    Minutes per session: Not on file  . Stress: Not on file  Relationships  . Social connections:    Talks on phone: Not on file    Gets together: Not on file    Attends religious service: Not on file    Active member of club or organization: Not on file    Attends meetings of clubs or organizations: Not on file    Relationship status: Not on file  Other Topics Concern  . Not on file  Social History Narrative  . Not on file   Additional Social History:                         Sleep: Good  Appetite:  Good  Current Medications: Current Facility-Administered Medications  Medication Dose Route Frequency Provider Last Rate Last Dose  . diclofenac sodium (VOLTAREN) 1 % transdermal gel 2 g  2 g Topical BID PRN Rankin, Shuvon B, NP   2 g at 01/29/18 0942  . furosemide (LASIX) tablet 40 mg  40 mg Oral Daily Rankin, Shuvon B, NP   40 mg at 01/30/18 0821  . insulin aspart (novoLOG) injection 0-15 Units  0-15 Units Subcutaneous TID WC Antonieta Pert, MD   3 Units at 01/30/18 [phone]  . insulin aspart (novoLOG) injection 0-5 Units  0-5 Units Subcutaneous QHS Antonieta Pert, MD   2 Units at 01/29/18 2144  . lisinopril (PRINIVIL,ZESTRIL) tablet 5 mg  5 mg Oral Daily Rankin, Shuvon B, NP   5 mg at 01/30/18 0821  . metFORMIN (GLUCOPHAGE) tablet 1,000 mg  1,000 mg Oral BID WC Rankin, Shuvon B, NP   1,000 mg at 01/30/18 6568  . metoprolol tartrate  (LOPRESSOR) tablet 25 mg  25 mg Oral BID Malvin Johns, MD   25 mg at 01/30/18 1275  . nitroGLYCERIN (NITROSTAT) SL tablet 0.4 mg  0.4 mg Sublingual Q5 Min x 3 PRN Rankin, Shuvon B, NP      . potassium chloride SA (K-DUR,KLOR-CON) CR tablet 20 mEq  20 mEq Oral Daily Rankin, Shuvon B, NP   20 mEq at 01/30/18 0821  . sertraline (ZOLOFT) tablet 100 mg  100 mg Oral Daily Rankin, Shuvon B, NP   100 mg at 01/30/18 1700  . traZODone (DESYREL) tablet 150 mg  150 mg Oral QHS Rankin, Shuvon B, NP   150 mg at 01/29/18 2145  Lab Results:  Results for orders placed or performed during the hospital encounter of 01/27/18 (from the past 48 hour(s))  Glucose, capillary     Status: Abnormal   Collection Time: 01/28/18 12:14 PM  Result Value Ref Range   Glucose-Capillary 313 (H) 70 - 99 mg/dL   Comment 1 Notify RN    Comment 2 Document in Chart   Glucose, capillary     Status: Abnormal   Collection Time: 01/28/18  5:13 PM  Result Value Ref Range   Glucose-Capillary 199 (H) 70 - 99 mg/dL  Glucose, capillary     Status: Abnormal   Collection Time: 01/28/18  9:02 PM  Result Value Ref Range   Glucose-Capillary 213 (H) 70 - 99 mg/dL   Comment 1 Notify RN    Comment 2 Document in Chart   Glucose, capillary     Status: Abnormal   Collection Time: 01/29/18  6:28 AM  Result Value Ref Range   Glucose-Capillary 225 (H) 70 - 99 mg/dL   Comment 1 Notify RN    Comment 2 Document in Chart   Glucose, capillary     Status: Abnormal   Collection Time: 01/29/18 12:00 PM  Result Value Ref Range   Glucose-Capillary 247 (H) 70 - 99 mg/dL  Glucose, capillary     Status: Abnormal   Collection Time: 01/29/18  5:07 PM  Result Value Ref Range   Glucose-Capillary 142 (H) 70 - 99 mg/dL  Glucose, capillary     Status: Abnormal   Collection Time: 01/29/18  7:53 PM  Result Value Ref Range   Glucose-Capillary 258 (H) 70 - 99 mg/dL  Glucose, capillary     Status: Abnormal   Collection Time: 01/30/18  6:14 AM  Result Value  Ref Range   Glucose-Capillary 164 (H) 70 - 99 mg/dL    Blood Alcohol level:  Lab Results  Component Value Date   ETH <10 01/27/2018   ETH <10 01/02/2018    Metabolic Disorder Labs: Lab Results  Component Value Date   HGBA1C 10.0 (H) 10/01/2017   MPG 240.3 10/01/2017   MPG 165.68 06/17/2017   No results found for: PROLACTIN Lab Results  Component Value Date   CHOL 209 (H) 10/01/2017   TRIG 128 10/01/2017   HDL 32 (L) 10/01/2017   CHOLHDL 6.5 10/01/2017   VLDL 26 10/01/2017   LDLCALC 151 (H) 10/01/2017   LDLCALC 146 (H) 06/19/2017    Physical Findings: AIMS: Facial and Oral Movements Muscles of Facial Expression: None, normal Lips and Perioral Area: None, normal Jaw: None, normal Tongue: None, normal,Extremity Movements Upper (arms, wrists, hands, fingers): None, normal Lower (legs, knees, ankles, toes): None, normal, Trunk Movements Neck, shoulders, hips: None, normal, Overall Severity Severity of abnormal movements (highest score from questions above): None, normal Incapacitation due to abnormal movements: None, normal Patient's awareness of abnormal movements (rate only patient's report): No Awareness, Dental Status Current problems with teeth and/or dentures?: No Does patient usually wear dentures?: No  CIWA:  CIWA-Ar Total: 2 COWS:     Musculoskeletal: Strength & Muscle Tone: within normal limits Gait & Station: normal Patient leans: N/A  Psychiatric Specialty Exam: Physical Exam  Vitals reviewed. Constitutional: He appears well-developed.  Cardiovascular: Normal rate.  Psychiatric: He has a normal mood and affect. His behavior is normal.    Review of Systems  Psychiatric/Behavioral: Negative for depression. Hallucinations: intrmittient auditory  The patient is nervous/anxious.   All other systems reviewed and are negative.   Blood pressure 125/70,  pulse 91, temperature 98 F (36.7 C), resp. rate 18, height 5\' 10"  (1.778 m), weight 86.2 kg, SpO2 100  %.Body mass index is 27.26 kg/m.  General Appearance: Casual  Eye Contact:  Poor  Speech:  Slow  Volume:  Decreased  Mood:  Dysphoric  Affect:  Constricted  Thought Process:  Coherent  Orientation:  Full (Time, Place, and Person)  Thought Content:  Logical  Suicidal Thoughts:  No  Homicidal Thoughts:  No  Memory:  Immediate;   Fair  Judgement:  Fair  Insight:  Fair  Psychomotor Activity: Generally in bed  Concentration:  Concentration: Fair  Recall:  [employer]  Fund of Knowledge:  Fair  Language:  Fair  Akathisia:  Negative  Handed:  Right  AIMS (if indicated):     Assets:  Resilience  ADL's:  Intact  Cognition:  WNL  Sleep:  Number of Hours: 6.5     Treatment Plan Summary: Daily contact with patient to assess and evaluate symptoms and progress in treatment and Medication management  Continue with current treatment plan on 01/30/2018 as listed below except where noted  Continue Zoloft 100mg   mood stabilization. Continue with Trazodone 150 mg for insomnia   Will continue to monitor vitals ,medication compliance and treatment side effects while patient is here.  Reviewed labs: Glucose 312  elevated ,BAL - , UDS - pos for cocaine   CSW will continue working on disposition.  Patient to participate in therapeutic milieu  Oneta Rackanika N Syed Zukas, NP 01/30/2018, 11:24 AM

## 2018-01-30 NOTE — Progress Notes (Signed)
D Pt is observed OOB UAL on the 300 hall today. HE does not talk , choosing to shake his head "yes" or "no" when he communicates with this Clinical research associate. HE wears hospital-issue scrubs. He is encouraged by this writer to bathe, due to BO.He stays in his bed, in between meals but does go to the cafe to eat his meals. He does not talk about his feelings nor does he ask Clinical research associate any qquestions and and he has no complaints that he voices to this Clinical research associate.     A HE did complete his daily assessment and on this he wrote he has experienced suicidal ideation today. When Clinical research associate asked him about this,. He said he has this " all the time" and he shook his head " yes" to Clinical research associate, in repsonse to [employer] him if he could stay safe and not act on these feelings. He rated his depression, hopelessness and anxiety " 11/05/08", respectivelky. He attended the [employer] group today and while he did not take part in the group discussison, he paid attention to the discussion and kept eye contact with writer during the group.     R Safeyt is in place.

## 2018-01-31 LAB — GLUCOSE, CAPILLARY
Glucose-Capillary: 234 mg/dL — ABNORMAL HIGH (ref 70–99)
Glucose-Capillary: 167 mg/dL — ABNORMAL HIGH (ref 70–99)
Glucose-Capillary: 239 mg/dL — ABNORMAL HIGH (ref 70–99)
Glucose-Capillary: 262 mg/dL — ABNORMAL HIGH (ref 70–99)

## 2018-01-31 MED ORDER — ARIPIPRAZOLE 5 MG PO TABS
5.0000 mg | ORAL_TABLET | Freq: Every day | ORAL | Status: DC
  Administered 2018-01-31 – 2018-02-01 (×2): 5 mg via ORAL
  Filled 2018-01-31: qty 14
  Filled 2018-01-31 (×3): qty 1

## 2018-01-31 NOTE — Progress Notes (Signed)
[facility] MD Progress Note  01/31/2018 9:48 AM Marcus Bathe Sr.  MRN:  [id number]    Evaluation: [place] observed standing at the nurses station awaiting medication administration.  Continues to deny suicidal or homicidal ideations.  Reports chronic auditory hallucinations denies command in nature.  Will initiate Abilify 5 mg p.o. daily.  Patient was agreeable with plan. Patient reports he has attempted to follow-up with [facility] rescue mission reports " they have a bed available for me" chart review patient continues to isolate to room.  Rates his depression 0 out of 10 with 10 being the worst.  Reports a good appetite.  States he is resting well throughout the night.  Support encouragement and reassurance was provided.   History: Per assessment note: Patient is a 53 year old male with a past psychiatric history significant for bipolar disorder, PTSD, substance abuse involving alcohol, cocaine and cannabis. The patient presented to the [facility] emergency department with suicidal ideation. He stated he came in due to medical reasons, and then stated he was suicidal with a plan to kill himself by cutting his wrist. He apparently overdosed on pills 5 years ago. He has admitted to some homicidal ideation but is nonspecific. He is being followed by the [facility] for his mental health treatment. He has been admitted to our facility on several occasions. His last admission here was on 12/29/2017. He was discharged on 01/02/2018. He stated that he has been homeless for the last month.   Principal Problem: Various psychiatric diagnoses to include bipolar, depression, PTSD, alcohol, cocaine and cannabis dependencies and abuse, chronic homelessness and chronic issues of secondary gain   Diagnosis: Active Problems:   MDD (major depressive disorder), severe (HCC)  Total Time spent with patient: 20 minutes  Past Psychiatric History: Here last month  Past Medical  History:  Past Medical History:  Diagnosis Date  . Depression   . Diabetes mellitus without complication (HCC)   . Hypertension   . MI, acute, non ST segment elevation (HCC) 2007   stents x 2 2007 [facility] [place])  . Stroke (HCC) 2017  . Suicidal ideation     Past Surgical History:  Procedure Laterality Date  . CORONARY ARTERY BYPASS GRAFT N/A 06/23/2017   Procedure: CORONARY ARTERY BYPASS GRAFTING (CABG) x 3; Using Left Internal Mammary Artery and Right Leg Greater Saphenous Vein harvested endoscopically.;  Surgeon: Kerin Perna, MD;  Location: [facility] OR;  Service: Open Heart Surgery;  Laterality: N/A;  . LEFT HEART CATH AND CORONARY ANGIOGRAPHY N/A 06/18/2017   Procedure: LEFT HEART CATH AND CORONARY ANGIOGRAPHY;  Surgeon: Lennette Bihari, MD;  Location: [facility] INVASIVE CV LAB;  Service: Cardiovascular;  Laterality: N/A;  . TEE WITHOUT CARDIOVERSION N/A 06/23/2017   Procedure: TRANSESOPHAGEAL ECHOCARDIOGRAM (TEE);  Surgeon: Donata Clay, Theron Arista, MD;  Location: [facility] OR;  Service: Open Heart Surgery;  Laterality: N/A;   Family History:  Family History  Problem Relation Age of Onset  . CAD Mother   . Congestive Heart Failure Father    Family Psychiatric  History: no new info Social History:  Social History   Substance and Sexual Activity  Alcohol Use Yes   Comment: 1-2  beers a week     Social History   Substance and Sexual Activity  Drug Use Yes  . Types: Marijuana, Cocaine    Social History   Socioeconomic History  . Marital status: Single    Spouse name: Not on file  . Number of children: Not on  file  . Years of education: Not on file  . Highest education level: Not on file  Occupational History  . Not on file  Social Needs  . Financial resource strain: Not on file  . Food insecurity:    Worry: Not on file    Inability: Not on file  . Transportation needs:    Medical: Not on file    Non-medical: Not on file  Tobacco Use  . Smoking status: Current Every Day Smoker     Packs/day: 1.00    Types: Cigarettes  . Smokeless tobacco: Never Used  . Tobacco comment: Declined information  Substance and Sexual Activity  . Alcohol use: Yes    Comment: 1-2  beers a week  . Drug use: Yes    Types: Marijuana, Cocaine  . Sexual activity: Not Currently  Lifestyle  . Physical activity:    Days per week: Not on file    Minutes per session: Not on file  . Stress: Not on file  Relationships  . Social connections:    Talks on phone: Not on file    Gets together: Not on file    Attends religious service: Not on file    Active member of club or organization: Not on file    Attends meetings of clubs or organizations: Not on file    Relationship status: Not on file  Other Topics Concern  . Not on file  Social History Narrative  . Not on file   Additional Social History:                         Sleep: Good  Appetite:  Good  Current Medications: Current Facility-Administered Medications  Medication Dose Route Frequency Provider Last Rate Last Dose  . diclofenac sodium (VOLTAREN) 1 % transdermal gel 2 g  2 g Topical BID PRN Rankin, Shuvon B, NP   2 g at 01/29/18 0942  . furosemide (LASIX) tablet 40 mg  40 mg Oral Daily Rankin, Shuvon B, NP   40 mg at 01/31/18 0834  . insulin aspart (novoLOG) injection 0-15 Units  0-15 Units Subcutaneous TID WC Antonieta Pert, MD   5 Units at 01/31/18 [phone]  . insulin aspart (novoLOG) injection 0-5 Units  0-5 Units Subcutaneous QHS Antonieta Pert, MD   2 Units at 01/29/18 2144  . lisinopril (PRINIVIL,ZESTRIL) tablet 5 mg  5 mg Oral Daily Rankin, Shuvon B, NP   5 mg at 01/31/18 0834  . metFORMIN (GLUCOPHAGE) tablet 1,000 mg  1,000 mg Oral BID WC Rankin, Shuvon B, NP   1,000 mg at 01/31/18 0834  . metoprolol tartrate (LOPRESSOR) tablet 25 mg  25 mg Oral BID Malvin Johns, MD   25 mg at 01/31/18 0834  . nitroGLYCERIN (NITROSTAT) SL tablet 0.4 mg  0.4 mg Sublingual Q5 Min x 3 PRN Rankin, Shuvon B, NP      . potassium  chloride SA (K-DUR,KLOR-CON) CR tablet 20 mEq  20 mEq Oral Daily Rankin, Shuvon B, NP   20 mEq at 01/31/18 0834  . sertraline (ZOLOFT) tablet 100 mg  100 mg Oral Daily Rankin, Shuvon B, NP   100 mg at 01/31/18 0835  . traZODone (DESYREL) tablet 150 mg  150 mg Oral QHS Rankin, Shuvon B, NP   150 mg at 01/30/18 2110    Lab Results:  Results for orders placed or performed during the hospital encounter of 01/27/18 (from the past 48 hour(s))  Glucose, capillary  Status: Abnormal   Collection Time: 01/29/18 12:00 PM  Result Value Ref Range   Glucose-Capillary 247 (H) 70 - 99 mg/dL  Glucose, capillary     Status: Abnormal   Collection Time: 01/29/18  5:07 PM  Result Value Ref Range   Glucose-Capillary 142 (H) 70 - 99 mg/dL  Glucose, capillary     Status: Abnormal   Collection Time: 01/29/18  7:53 PM  Result Value Ref Range   Glucose-Capillary 258 (H) 70 - 99 mg/dL  Glucose, capillary     Status: Abnormal   Collection Time: 01/30/18  6:14 AM  Result Value Ref Range   Glucose-Capillary 164 (H) 70 - 99 mg/dL  Glucose, capillary     Status: Abnormal   Collection Time: 01/30/18 11:49 AM  Result Value Ref Range   Glucose-Capillary 234 (H) 70 - 99 mg/dL  Glucose, capillary     Status: Abnormal   Collection Time: 01/30/18  4:58 PM  Result Value Ref Range   Glucose-Capillary 271 (H) 70 - 99 mg/dL  Glucose, capillary     Status: Abnormal   Collection Time: 01/30/18  8:37 PM  Result Value Ref Range   Glucose-Capillary 120 (H) 70 - 99 mg/dL  Glucose, capillary     Status: Abnormal   Collection Time: 01/31/18  6:05 AM  Result Value Ref Range   Glucose-Capillary 234 (H) 70 - 99 mg/dL    Blood Alcohol level:  Lab Results  Component Value Date   ETH <10 01/27/2018   ETH <10 01/02/2018    Metabolic Disorder Labs: Lab Results  Component Value Date   HGBA1C 10.0 (H) 10/01/2017   MPG 240.3 10/01/2017   MPG 165.68 06/17/2017   No results found for: PROLACTIN Lab Results  Component  Value Date   CHOL 209 (H) 10/01/2017   TRIG 128 10/01/2017   HDL 32 (L) 10/01/2017   CHOLHDL 6.5 10/01/2017   VLDL 26 10/01/2017   LDLCALC 151 (H) 10/01/2017   LDLCALC 146 (H) 06/19/2017    Physical Findings: AIMS: Facial and Oral Movements Muscles of Facial Expression: None, normal Lips and Perioral Area: None, normal Jaw: None, normal Tongue: None, normal,Extremity Movements Upper (arms, wrists, hands, fingers): None, normal Lower (legs, knees, ankles, toes): None, normal, Trunk Movements Neck, shoulders, hips: None, normal, Overall Severity Severity of abnormal movements (highest score from questions above): None, normal Incapacitation due to abnormal movements: None, normal Patient's awareness of abnormal movements (rate only patient's report): No Awareness, Dental Status Current problems with teeth and/or dentures?: No Does patient usually wear dentures?: No  CIWA:  CIWA-Ar Total: 2 COWS:     Musculoskeletal: Strength & Muscle Tone: within normal limits Gait & Station: normal Patient leans: N/A  Psychiatric Specialty Exam: Physical Exam  Vitals reviewed. Constitutional: He appears well-developed.  Cardiovascular: Normal rate.  Psychiatric: He has a normal mood and affect. His behavior is normal.    Review of Systems  Psychiatric/Behavioral: Negative for depression. Hallucinations: intrmittient auditory  The patient is nervous/anxious.   All other systems reviewed and are negative.   Blood pressure 99/73, pulse 91, temperature 98 F (36.7 C), resp. rate 20, height 5\' 10"  (1.778 m), weight 86.2 kg, SpO2 100 %.Body mass index is 27.26 kg/m.  General Appearance: Casual  Eye Contact:  Poor  Speech:  Slow  Volume:  Decreased  Mood:  Dysphoric  Affect:  Constricted  Thought Process:  Coherent  Orientation:  Full (Time, Place, and Person)  Thought Content:  Logical and Hallucinations: Auditory  mumbles/voices  Suicidal Thoughts:  No  Homicidal Thoughts:  No   Memory:  Immediate;   Fair  Judgement:  Fair  Insight:  Fair  Psychomotor Activity: Generally in bed  Concentration:  Concentration: Fair  Recall:  [employer] of Knowledge:  Fair  Language:  Fair  Akathisia:  Negative  Handed:  Right  AIMS (if indicated):     Assets:  Resilience  ADL's:  Intact  Cognition:  WNL  Sleep:  Number of Hours: 6.75     Treatment Plan Summary: Daily contact with patient to assess and evaluate symptoms and progress in treatment and Medication management  Continue with current treatment plan on 01/31/2018 as listed below except where noted  Continue Zoloft 100mg  p.o. daily for mood stabilization Start Abilify 5 mg p.o. daily for mood stabilization Continue with Trazodone 150 mg p.o. nightly for insomnia   Will continue to monitor vitals ,medication compliance and treatment side effects while patient is here.  Reviewed labs: Glucose 312  elevated ,BAL - , UDS - pos for cocaine   CSW will continue working on disposition.  Patient to participate in therapeutic milieu  Oneta Rack, NP 01/31/2018, 9:48 AM

## 2018-01-31 NOTE — [facility] Group Notes (Signed)
[facility] Group Notes: (Clinical Social Work)   01/31/2018      Type of Therapy:  Group Therapy   Participation Level:  Did Not Attend despite MHT prompting   Jayron Maqueda Grossman-Orr, LCSW 01/31/2018, 11:52 AM          

## 2018-01-31 NOTE — [facility] Group Notes (Signed)
[facility] Group Notes:  (Nursing)  Date:  01/31/2018  Time: 1:15 Pm Type of Therapy:  Nurse Education  Participation Level:  Active  Participation Quality:  Appropriate  Affect:  Appropriate  Cognitive:  Appropriate  Insight:  Appropriate  Engagement in Group:  Engaged  Modes of Intervention:  Education  Summary of Progress/Problems: Healthy Supports Group Shela NevinValerie S Bristol Soy 01/31/2018, 5:57 PM

## 2018-01-31 NOTE — Progress Notes (Signed)
Patient did not attend the evening speaker AA meeting. Pt was notified that group was beginning but remained in bed.   

## 2018-01-31 NOTE — Plan of Care (Signed)
Progress note  D: pt found in bed; compliant with medication administration. Pt states he slept fair. Pt rates his depression/hopelessness/anxiety an 8/8/8 out of 10 respectively. Pt denies any physical problems, but does complain of leg pain that he rates at a 6/10. Pt states his goal for today is to go to groups and he will achieve this by doing so. Pt states he has had thoughts of hurting himself but has no plan while here. Pt also states he is hearing voices but they are getting better. Pt denies any hi/vh and verbally agrees to approach staff if these become apparent or before harming himself or others while at [facility].  A: pt provided support and encouragement. Pt given medication per protocol and standing orders. Q43m safety checks implemented and continued. R: pt safe on the unit. Will continue to monitor.   Pt progressing in the following metrics  Problem: Health Behavior/Discharge Planning: Goal: Identification of resources available to assist in meeting health care needs will improve Outcome: Progressing Goal: Compliance with treatment plan for underlying cause of condition will improve Outcome: Progressing   Problem: Physical Regulation: Goal: Ability to maintain clinical measurements within normal limits will improve Outcome: Progressing   Problem: Education: Goal: Knowledge of disease or condition will improve Outcome: Progressing

## 2018-02-01 DIAGNOSIS — G4700 Insomnia, unspecified: Secondary | ICD-10-CM

## 2018-02-01 LAB — GLUCOSE, CAPILLARY: Glucose-Capillary: 151 mg/dL — ABNORMAL HIGH (ref 70–99)

## 2018-02-01 MED ORDER — LISINOPRIL 5 MG PO TABS: 5.0000 mg | ORAL_TABLET | Freq: Every day | ORAL | 0 refills | Status: AC

## 2018-02-01 MED ORDER — ATORVASTATIN CALCIUM 80 MG PO TABS: 80.0000 mg | ORAL_TABLET | Freq: Every day | ORAL | 0 refills | Status: DC

## 2018-02-01 MED ORDER — NITROGLYCERIN 0.4 MG SL SUBL: 0.4000 mg | SUBLINGUAL_TABLET | SUBLINGUAL | 0 refills | Status: AC | PRN

## 2018-02-01 MED ORDER — NALTREXONE HCL 50 MG PO TABS: 50.0000 mg | ORAL_TABLET | Freq: Every day | ORAL | 0 refills | Status: DC

## 2018-02-01 MED ORDER — DICLOFENAC SODIUM 1 % TD GEL: 2.0000 g | Freq: Two times a day (BID) | TRANSDERMAL | Status: AC | PRN

## 2018-02-01 MED ORDER — NICOTINE 21 MG/24HR TD PT24: 21.0000 mg | MEDICATED_PATCH | Freq: Every day | TRANSDERMAL | 0 refills | Status: AC

## 2018-02-01 MED ORDER — TRAZODONE HCL 150 MG PO TABS: 150.0000 mg | ORAL_TABLET | Freq: Every day | ORAL | 0 refills | Status: AC

## 2018-02-01 MED ORDER — ARIPIPRAZOLE 5 MG PO TABS: 5.0000 mg | ORAL_TABLET | Freq: Every day | ORAL | 0 refills | Status: AC

## 2018-02-01 MED ORDER — METFORMIN HCL 1000 MG PO TABS: 1000.0000 mg | ORAL_TABLET | Freq: Two times a day (BID) | ORAL | 0 refills | Status: AC

## 2018-02-01 MED ORDER — METOPROLOL TARTRATE 25 MG PO TABS: 25.0000 mg | ORAL_TABLET | Freq: Two times a day (BID) | ORAL | 0 refills | Status: AC

## 2018-02-01 MED ORDER — ASPIRIN 325 MG PO TBEC: 325.0000 mg | DELAYED_RELEASE_TABLET | Freq: Every day | ORAL | 0 refills | Status: DC

## 2018-02-01 MED ORDER — POTASSIUM CHLORIDE CRYS ER 20 MEQ PO TBCR: 20.0000 meq | EXTENDED_RELEASE_TABLET | Freq: Every day | ORAL | 0 refills | Status: AC

## 2018-02-01 MED ORDER — SERTRALINE HCL 100 MG PO TABS: 100.0000 mg | ORAL_TABLET | Freq: Every day | ORAL | 0 refills | Status: AC

## 2018-02-01 MED ORDER — FUROSEMIDE 40 MG PO TABS: 40.0000 mg | ORAL_TABLET | Freq: Every day | ORAL | 0 refills | Status: AC

## 2018-02-01 NOTE — Plan of Care (Signed)
Progress note  Patient verbalizes readiness for discharge. Follow up plan explained, AVS, Transition record and SRA given. Prescriptions and teaching provided. Belongings returned and signed for. Suicide safety plan completed and signed. Patient verbalizes understanding. Patient denies SI/HI and assures this Clinical research associate he will seek assistance should that change. Patient discharged to lobby with bus pass, map to the depot, and a map to the his destination.  Problem: Education: Goal: Knowledge of [place] [facility] Education information/materials will improve Outcome: Adequate for Discharge Goal: Emotional status will improve Outcome: Adequate for Discharge Goal: Mental status will improve Outcome: Adequate for Discharge Goal: Verbalization of understanding the information provided will improve Outcome: Adequate for Discharge   Problem: Activity: Goal: Interest or engagement in activities will improve Outcome: Adequate for Discharge Goal: Sleeping patterns will improve Outcome: Adequate for Discharge   Problem: Coping: Goal: Ability to verbalize frustrations and anger appropriately will improve Outcome: Adequate for Discharge Goal: Ability to demonstrate self-control will improve Outcome: Adequate for Discharge   Problem: Health Behavior/Discharge Planning: Goal: Identification of resources available to assist in meeting health care needs will improve Outcome: Adequate for Discharge Goal: Compliance with treatment plan for underlying cause of condition will improve Outcome: Adequate for Discharge   Problem: Physical Regulation: Goal: Ability to maintain clinical measurements within normal limits will improve Outcome: Adequate for Discharge   Problem: Safety: Goal: Periods of time without injury will increase Outcome: Adequate for Discharge   Problem: Education: Goal: Knowledge of disease or condition will improve Outcome: Adequate for Discharge Goal: Understanding of discharge  needs will improve Outcome: Adequate for Discharge   Problem: Physical Regulation: Goal: Complications related to the disease process, condition or treatment will be avoided or minimized Outcome: Adequate for Discharge   Problem: Health Behavior/Discharge Planning: Goal: Identification of resources available to assist in meeting health care needs will improve Outcome: Adequate for Discharge

## 2018-02-01 NOTE — Discharge Summary (Signed)
Physician Discharge Summary Note  Patient:  Marcus DanesGregory L Lamountain Sr. is an 53 y.o., male  MRN:  [id number]  DOB:  1965-09-15  Patient phone:  [phone] (home)   Patient address:   [address] [place] [address],   Total Time spent with patient: Greater than 30 minutes  Date of Admission:  01/27/2018  Date of Discharge: 02/01/18  Reason for Admission: Suicidal ideations with plans to cut his wrist.  Principal Problem: MDD (major depressive disorder), severe [facility](HCC)  Discharge Diagnoses: Patient Active Problem List   Diagnosis Date Noted  . Severe recurrent major depression without psychotic features (HCC) [F33.2] 12/29/2017    Priority: High  . MDD (major depressive disorder), severe (HCC) [F32.2] 01/27/2018  . Cocaine abuse (HCC) [F14.10] 10/10/2017  . Schizoaffective disorder (HCC) [F25.9] 09/30/2017  . 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 Essential hypertension [I10]   . Dyslipidemia [E78.5]   . Chest pain on exertion [R07.9]   . Stable angina (HCC) [I20.8] 06/17/2017   Past Psychiatric History: History of prior psychiatric admissions, most [place]. Reports history of prior suicidal attempts by overdose. Reports past, not recent, history of auditory hallucinations. States that in the past he has been diagnosed with Bipolar Disorder but currently does not endorse any clear history of hypomania/mania. He has been prescribed Invega Sustenna 156 mg Q monthly and on Naltrexone 380 mgrs SUSR monthly.  History of alcohol use disorder  Past Medical History:  Past Medical History:  Diagnosis Date  . Depression   . Diabetes mellitus without complication (HCC)   . Hypertension   . MI, acute, non ST segment elevation (HCC) 2007   stents x 2 2007 [facility]  . Stroke (HCC) 2017  . Suicidal ideation     Past Surgical History:  Procedure Laterality Date  . CORONARY ARTERY BYPASS GRAFT N/A 06/23/2017   Procedure:  CORONARY ARTERY BYPASS GRAFTING (CABG) x 3; Using Left Internal Mammary Artery and Right Leg Greater Saphenous Vein harvested endoscopically.;  Surgeon: Kerin PernaVan Trigt, Peter, MD;  [facility];  Service: Open Heart Surgery;  Laterality: N/A;  . LEFT HEART CATH AND CORONARY ANGIOGRAPHY N/A 06/18/2017   Procedure: LEFT HEART CATH AND CORONARY ANGIOGRAPHY;  Surgeon: Lennette BihariKelly, Thomas A, MD;  Location: [facility] INVASIVE CV LAB;  Service: Cardiovascular;  Laterality: N/A;  . TEE WITHOUT CARDIOVERSION N/A 06/23/2017   Procedure: TRANSESOPHAGEAL ECHOCARDIOGRAM (TEE);  Surgeon: Donata ClayVan Trigt, Theron AristaPeter, MD;  [facility];  Service: Open Heart Surgery;  Laterality: N/A;   Family History:  Family History  Problem Relation Age of Onset  . CAD Mother   . Congestive Heart Failure Father    Family Psychiatric  History: See H&P  Social History:  Social History   Substance and Sexual Activity  Alcohol Use Yes   Comment: 1-2  beers a week     Social History   Substance and Sexual Activity  Drug Use Yes  . Types: Marijuana, Cocaine    Social History   Socioeconomic History  . Marital status: Single    Spouse name: Not on file  . Number of children: Not on file  . Years of education: Not on file  . Highest education level: Not on file  Occupational History  . Not on file  Social Needs  . Financial resource strain: Not on file  . Food insecurity:    Worry: Not on file    Inability: Not on file  .  Transportation needs:    Medical: Not on file    Non-medical: Not on file  Tobacco Use  . Smoking status: Current Every Day Smoker    Packs/day: 1.00    Types: Cigarettes  . Smokeless tobacco: Never Used  . Tobacco comment: Declined information  Substance and Sexual Activity  . Alcohol use: Yes    Comment: 1-2  beers a week  . Drug use: Yes    Types: Marijuana, Cocaine  . Sexual activity: Not Currently  Lifestyle  . Physical activity:    Days per week: Not on file    Minutes per session: Not on file   . Stress: Not on file  Relationships  . Social connections:    Talks on phone: Not on file    Gets together: Not on file    Attends religious service: Not on file    Active member of club or organization: Not on file    Attends meetings of clubs or organizations: Not on file    Relationship status: Not on file  Other Topics Concern  . Not on file  Social History Narrative  . Not on file   Hospital Course: (Per Md's admission evaluation): Patient is a 53 year old male with a past psychiatric history significant for bipolar disorder, PTSD, substance abuse involving alcohol, cocaine and cannabis. The patient presented to the [facility] emergency department with suicidal ideation. He stated he came in due to medical reasons, and then stated he was suicidal with a plan to kill himself by cutting his wrist. He apparently overdosed on pills 5 years ago. He has admitted to some homicidal ideation but is nonspecific. He is being followed by the [facility] for his mental health treatment. He has been admitted to our facility on several occasions. His last admission here was on 12/29/2017. He was discharged on 01/02/2018. He stated that he has been homeless for the last month. He stated that the place he was staying at evicted him because of his substance abuse. He is requesting to go to the [employer]. His drug screen was positive for cocaine, but his blood alcohol was less than 10. His liver function enzymes were normal. His MCV is normal. He also has a past medical history significant for hypertension, diabetes mellitus, status post myocardial infarction with stent placement and status post CVA. He was admitted to the hospital for evaluation and stabilization.  This is one of several discharge summaries from this [facility] alone for this 53 year old male with hx of polysubstance use disorder & multiple chronic medical issues. He came to the  hospital this time complaining of suicidal ideations with plans to cut his wrists. His UDS on admission was positive for cocaine. He does have hx of cannabis use disorder. He did not receive any detoxification treatments as cocaine intoxication or withdrawal symptoms as of yet has no established detoxification treatment protocols. He was however, medicated for his symptoms of depression & anxiety. He received & was discharged on; Nicotine patch 21 mg for smoking cessation, Trazodone 150 mg prn for insomnia & Sertraline 100 mg for depression. He was resumed & discharged on all his pertinent home medications for those pre-existing medical issues presented. He tolerated his treatment regimen without any adverse effects or reactions reported. He was also enrolled & participated in the group counseling sessions being offered & held on this unit. He learned coping skills.  Marcus Cummings's symptoms responded well to his treatment regimen. This is evidenced  by his reports of improved mood, absence of suicidal ideations & absence of substance withdrawal symptoms. He presents mentally & medically stable to be discharged today to continue mental health care on an outpatient basis as noted below. He was provided with all the necessary information needed to make the appointment without problems.  Upon discharge Marcus Cummings adamantly denies any SIHI, AVH, delusional thoughts or paranoia. He was provided with a 7 days worth supply samples of [facility]. He was able to engage in safety planning including plan to [facility] or contact emergency services if he feels unable to maintain his own safety or the safety of others. Pt had no further questions, comments or concerns.  He left [facility] in no apparent distress with all personal belongings.    Physical Findings: AIMS: Facial and Oral Movements Muscles of Facial Expression: None, normal Lips and Perioral Area: None, normal Jaw: None, normal Tongue: None,  normal,Extremity Movements Upper (arms, wrists, hands, fingers): None, normal Lower (legs, knees, ankles, toes): None, normal, Trunk Movements Neck, shoulders, hips: None, normal, Overall Severity Severity of abnormal movements (highest score from questions above): None, normal Incapacitation due to abnormal movements: None, normal Patient's awareness of abnormal movements (rate only patient's report): No Awareness, Dental Status Current problems with teeth and/or dentures?: No Does patient usually wear dentures?: No  CIWA:  CIWA-Ar Total: 2 COWS:     Musculoskeletal: Strength & Muscle Tone: within normal limits Gait & Station: normal Patient leans: N/A  Psychiatric Specialty Exam: Physical Exam  Nursing note and vitals reviewed. Constitutional: He is oriented to person, place, and time. He appears well-developed and well-nourished.  HENT:  Head: Normocephalic.  Eyes: Pupils are equal, round, and reactive to light.  Neck: Normal range of motion.  Cardiovascular: Normal rate.  Respiratory: Effort normal.  GI: Soft.  Genitourinary:    Genitourinary Comments: Deferred   Musculoskeletal: Normal range of motion.  Neurological: He is alert and oriented to person, place, and time.  Skin: Skin is warm.    Review of Systems  Constitutional: Negative.   HENT: Negative.   Eyes: Negative.   Respiratory: Negative.  Negative for cough and shortness of breath.   Cardiovascular: Negative.  Negative for chest pain and palpitations.  Gastrointestinal: Negative.  Negative for abdominal pain, heartburn, nausea and vomiting.  Genitourinary: Negative.   Musculoskeletal: Negative.   Skin: Negative.   Neurological: Negative.  Negative for dizziness and headaches.  Endo/Heme/Allergies: Negative.   Psychiatric/Behavioral: Positive for depression (Stable) and substance abuse (Hx. Cocaine & THC use disorder). Negative for hallucinations, memory loss and suicidal ideas. The patient has insomnia  (Stable). The patient is not nervous/anxious (Stable).     Blood pressure 97/68, pulse 91, temperature 98.2 F (36.8 C), resp. rate 20, height 5\' 10"  (1.778 m), weight 86.2 kg, SpO2 98 %.Body mass index is 27.26 kg/m.  See Md's discharge SRA   Have you used any form of tobacco in the last 30 days? (Cigarettes, Smokeless Tobacco, Cigars, and/or Pipes): Yes  Has this patient used any form of tobacco in the last 30 days? (Cigarettes, Smokeless Tobacco, Cigars, and/or Pipes): Yes, A an FDA-approved tobacco cessation medication was offered at discharge.  Blood Alcohol level:  Lab Results  Component Value Date   ETH <10 01/27/2018   ETH <10 01/02/2018   Metabolic Disorder Labs:  Lab Results  Component Value Date   HGBA1C 10.0 (H) 10/01/2017   MPG 240.3 10/01/2017   MPG 165.68 06/17/2017   No  results found for: PROLACTIN Lab Results  Component Value Date   CHOL 209 (H) 10/01/2017   TRIG 128 10/01/2017   HDL 32 (L) 10/01/2017   CHOLHDL 6.5 10/01/2017   VLDL 26 10/01/2017   LDLCALC 151 (H) 10/01/2017   LDLCALC 146 (H) 06/19/2017   See Psychiatric Specialty Exam and Suicide Risk Assessment completed by Attending Physician prior to discharge.  Discharge destination:  Home  Is patient on multiple antipsychotic therapies at discharge:  No   Has Patient had three or more failed trials of antipsychotic monotherapy by history:  No  Recommended Plan for Multiple Antipsychotic Therapies: NA  Allergies as of 02/01/2018      Reactions   Zyprexa [olanzapine] Itching, Other (See Comments)      Medication List    STOP taking these medications   aspirin 325 MG EC tablet   atorvastatin 80 MG tablet Commonly known as:  LIPITOR   Melatonin 3 MG Tabs   Naltrexone 380 MG Susr   naltrexone 50 MG tablet Commonly known as:  DEPADE     TAKE these medications     Indication  ARIPiprazole 5 MG tablet Commonly known as:  ABILIFY Take 1 tablet (5 mg total) by mouth daily. For mood  control Start taking on:  February 02, 2018  Indication:  Mood control   diclofenac sodium 1 % Gel Commonly known as:  VOLTAREN Apply 2 g topically 2 (two) times daily as needed (leg pain). For arthritis pain management What changed:  additional instructions  Indication:  Joint Damage causing Pain and Loss of Function   furosemide 40 MG tablet Commonly known as:  LASIX Take 1 tablet (40 mg total) by mouth daily. For swellings Start taking on:  February 02, 2018  Indication:  Edema, High Blood Pressure Disorder   lisinopril 5 MG tablet Commonly known as:  PRINIVIL,ZESTRIL Take 1 tablet (5 mg total) by mouth daily. For high blood pressure Start taking on:  February 02, 2018  Indication:  High Blood Pressure Disorder   metFORMIN 1000 MG tablet Commonly known as:  GLUCOPHAGE Take 1 tablet (1,000 mg total) by mouth 2 (two) times daily with a meal. What changed:  additional instructions  Indication:  Type 2 Diabetes   metoprolol tartrate 25 MG tablet Commonly known as:  LOPRESSOR Take 1 tablet (25 mg total) by mouth 2 (two) times daily. For high blood pressure What changed:    medication strength  how much to take  Indication:  High Blood Pressure Disorder   nicotine 21 mg/24hr patch Commonly known as:  NICODERM CQ - dosed in mg/24 hours Place 1 patch (21 mg total) onto the skin daily. (May buy from over the counter): For smoking cessation  Indication:  Nicotine Addiction   nitroGLYCERIN 0.4 MG SL tablet Commonly known as:  NITROSTAT Place 1 tablet (0.4 mg total) under the tongue every 5 (five) minutes x 3 doses as needed for chest pain.  Indication:  Acute Angina Pectoris   potassium chloride SA 20 MEQ tablet Commonly known as:  K-DUR,KLOR-CON Take 1 tablet (20 mEq total) by mouth daily. For potassium replacement Start taking on:  February 02, 2018 What changed:  additional instructions  Indication:  Low Amount of Potassium in the Blood   sertraline 100 MG tablet Commonly  known as:  ZOLOFT Take 1 tablet (100 mg total) by mouth daily. For depression What changed:  additional instructions  Indication:  Major Depressive Disorder   traZODone 150 MG tablet Commonly known  as:  DESYREL Take 1 tablet (150 mg total) by mouth at bedtime. For sleep  Indication:  Trouble Sleeping, mood stability      Follow-up Information    [facility] Follow up.   Why:  Please walk in within 3 days of hospital discharge to be assessed for outpatient mental health services including medication management. Walk-in hours: Monday-Friday at 8:30AM. Thank you.  Contact information: [address] [place], [place] [address] Phone: [phone] Fax: [phone]         Follow-up recommendations: Activity:  As tolerated Diet: As recommended by your primary care doctor. Keep all scheduled follow-up appointments as recommended.   Comments:  Patient is instructed prior to discharge to: Take all medications as prescribed by his/her mental healthcare provider. Report any adverse effects and or reactions from the medicines to his/her outpatient provider promptly. Patient has been instructed & cautioned: To not engage in alcohol and or illegal drug use while on prescription medicines. In the event of worsening symptoms, patient is instructed to call the crisis hotline, 911 and or go to the nearest ED for appropriate evaluation and treatment of symptoms. To follow-up with his/her primary care provider for your other medical issues, concerns and or health care needs.   Signed: Armandina Stammer, NP, PMHNP, FNP-BC 02/01/2018, 9:33 AM

## 2018-02-01 NOTE — Progress Notes (Signed)
Recreation Therapy Notes  Date: 1.6.20 Time: 0930 Location: 300 Hall Dayroom  Group Topic: Stress Management  Goal Area(s) Addresses:  Patient will engage in healthy stress management techniques. Patient will be able to identify positive stress management techniques.  Intervention: Stress Management  Activity : Meditation.  LRT introduced the stress management technique of meditation.  LRT played Cummings meditation that focused on looking at each day as Cummings new beginning.  Patients were to follow along as meditation was played in order to engage in activity.  Education:  Stress Management, Discharge Planning.   Education Outcome: Acknowledges Education  Clinical Observations/Feedback: Pt did not attend group.    Marcus Cummings, LRT/CTRS         Marcus Cummings 02/01/2018 10:59 AM 

## 2018-02-01 NOTE — [facility] Suicide Risk Assessment (Signed)
[facility]   Principal Problem: <principal problem not specified> Discharge Diagnoses: Active Problems:   MDD (major depressive disorder), severe (HCC)   Total Time spent with patient: 30 minutes Oriented mental status exam alert oriented to person place time day and situation affect constricted dysphoric but not having thoughts of harming self or others chronic homelessness discussed contracting fully no psychosis no mania  Mental Status Per Nursing Assessment::   On Admission:  Suicidal ideation indicated by patient  Demographic Factors:  Male  Loss Factors: Decrease in vocational status  Historical Factors: NA  Risk Reduction Factors:   Religious beliefs about death  Continued Clinical Symptoms:  Depression:   Severe  Cognitive Features That Contribute To Risk:  None    Suicide Risk:  Minimal: No identifiable suicidal ideation.  Patients presenting with no risk factors but with morbid ruminations; may be classified as minimal risk based on the severity of the depressive symptoms  Follow-up Information    [facility] Follow up.   Why:  Please walk in within 3 days of hospital discharge to be assessed for outpatient mental health services including medication management. Walk-in hours: Monday-Friday at 8:30AM. Thank you.  Contact information: [address] [place], [place] [address] Phone: [phone] Fax: [phone]          Plan Of Care/Follow-up recommendations:  Activity:  full  Javel Hersh, MD 02/01/2018, 9:21 AM

## 2018-02-01 NOTE — Progress Notes (Signed)
  [facility] Adult Case Management Discharge Plan :  Will you be returning to the same living situation after discharge:  No.Pt plans to go to [facility] at discharge.  At discharge, do you have transportation home?: Yes,  bus passes provided to patient Do you have the ability to pay for your medications: Yes,  [facility]; mental health  Release of information consent forms completed and submitted to medical records by CSW.   Patient to Follow up at: Follow-up Information    [facility] Follow up.   Why:  Please walk in within 3 days of hospital discharge to be assessed for outpatient mental health services including medication management. Walk-in hours: Monday-Friday at 8:30AM. Thank you.  Contact information: [address] [place], [place] [address] Phone: [phone] Fax: [phone]          Next level of care provider has access to [facility] Link:no  Safety Planning and Suicide Prevention discussed: Yes,  SPE completed with pt; pt declined to consent to collateral contact. SPI pamphlet and mobile crisis information provided to pt.   Have you used any form of tobacco in the last 30 days? (Cigarettes, Smokeless Tobacco, Cigars, and/or Pipes): Yes  Has patient been referred to the Quitline?: Patient refused referral  Patient has been referred for addiction treatment: Yes  Rona Ravens, LCSW 02/01/2018, 9:04 AM

## 2018-02-01 NOTE — Progress Notes (Signed)
D: Patient observed to be isolative tonight. Did come up for medications which were admin by fellow nurse on hall. Assessment by this writer brief due to an emergency with peer on hall. Patient soft spoken, cautious on approach. Forwards minimal information. Does endorse AH which are not command in nature. On observation patient does not appear to respond to internal stimuli. Patient's affect flat, mood depressed. Denies pain, physical complaints. CBG was "262."  A: Medicated per orders, 3 units of insulin given for coverage of CBG. No prns requested or required. Medication education provided. Level III obs in place for safety. Emotional support offered. Patient encouraged to complete Suicide Safety Plan before discharge. Encouraged to attend and participate in unit programming.    R: Patient verbalizes understanding of POC. Patient denies SI/HI/VH and remains safe on level III obs. Will continue to monitor throughout the night.

## 2019-02-07 IMAGING — CR DG CHEST 2V
2 series · 2 of 2 positions shown · non-contrast
Comparison: 01/02/2018

CLINICAL DATA: center chest pains over the last 1 hour. States they
have lessened in that hour. Denies any sob. Cough for 2 days
producing clear mucus. Hx of prior MI 7990, htn controlled with
medication, diabetes controlled with medication and insulin. Hx of
DAYA without cardioversion 06/23/2017, coronary angiography 06/18/2017,
CABG x3 06/23/2017.

EXAM:
CHEST - 2 VIEW

[chest pa]
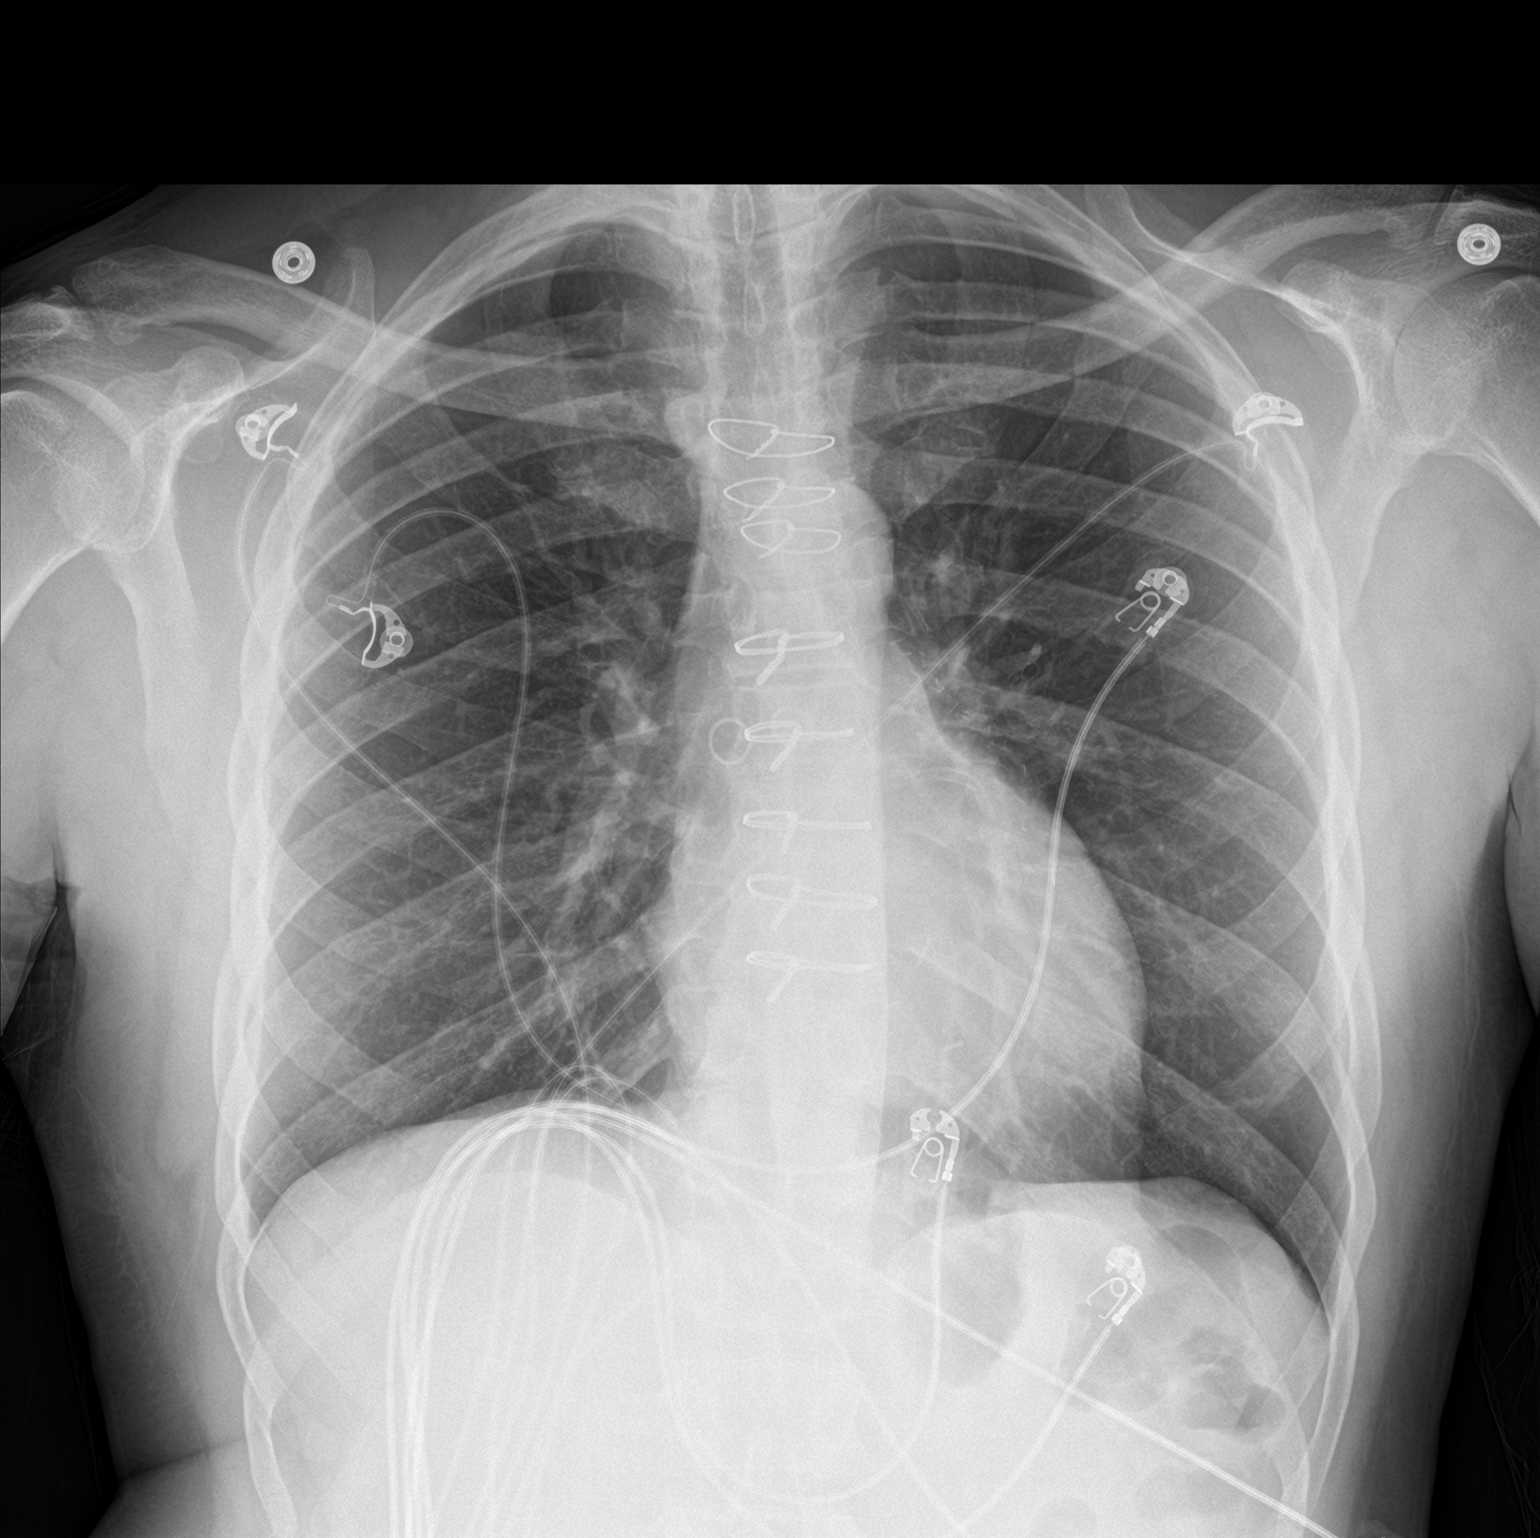

[chest lat]
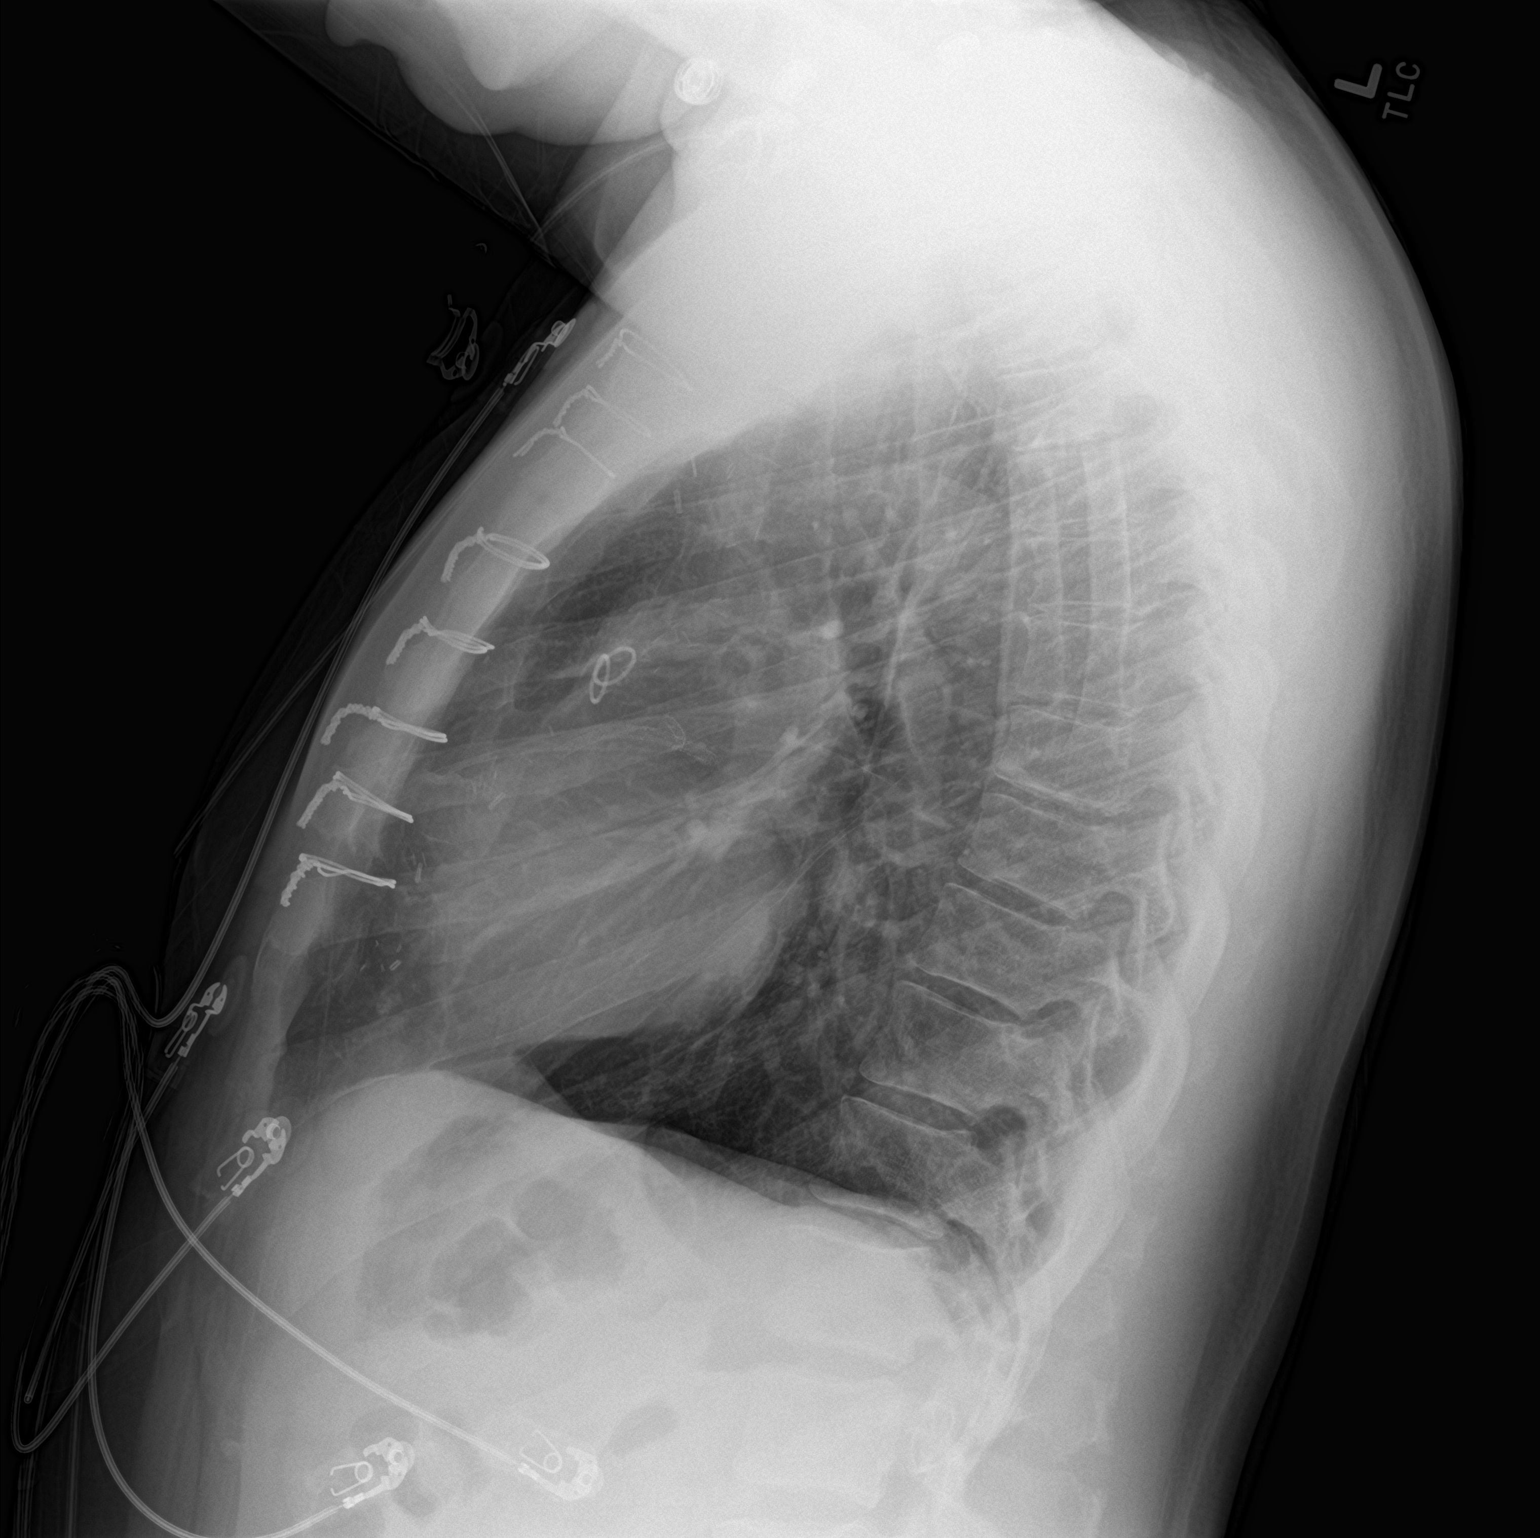

[2 of 2 positions shown; findings below may reference images not displayed]

FINDINGS: Lungs are clear.

Heart size and mediastinal contours are within normal limits.
Previous CABG and coronary stenting.

No effusion.  No pneumothorax.

Previous median sternotomy.
IMPRESSION: No acute disease post CABG.
